# Patient Record
Sex: Male | Born: 1965
Health system: Southern US, Community
[De-identification: ages and names within clinical notes are randomized; demographics above are authoritative.]

## PROBLEM LIST (undated history)

## (undated) DIAGNOSIS — R972 Elevated prostate specific antigen [PSA]: Secondary | ICD-10-CM

## (undated) DIAGNOSIS — M1990 Unspecified osteoarthritis, unspecified site: Secondary | ICD-10-CM

## (undated) DIAGNOSIS — Z87438 Personal history of other diseases of male genital organs: Secondary | ICD-10-CM

## (undated) DIAGNOSIS — J302 Other seasonal allergic rhinitis: Secondary | ICD-10-CM

## (undated) DIAGNOSIS — Z8042 Family history of malignant neoplasm of prostate: Secondary | ICD-10-CM

## (undated) DIAGNOSIS — E119 Type 2 diabetes mellitus without complications: Secondary | ICD-10-CM

## (undated) DIAGNOSIS — N529 Male erectile dysfunction, unspecified: Secondary | ICD-10-CM

## (undated) DIAGNOSIS — Z8679 Personal history of other diseases of the circulatory system: Secondary | ICD-10-CM

## (undated) DIAGNOSIS — G709 Myoneural disorder, unspecified: Secondary | ICD-10-CM

## (undated) DIAGNOSIS — E785 Hyperlipidemia, unspecified: Secondary | ICD-10-CM

## (undated) DIAGNOSIS — A6000 Herpesviral infection of urogenital system, unspecified: Secondary | ICD-10-CM

## (undated) HISTORY — DX: Morbid (severe) obesity due to excess calories: E66.01

## (undated) HISTORY — DX: Type 2 diabetes mellitus without complications: E11.9

## (undated) HISTORY — DX: Personal history of other diseases of male genital organs: Z87.438

## (undated) HISTORY — DX: Hyperlipidemia, unspecified: E78.5

## (undated) HISTORY — DX: Herpesviral infection of urogenital system, unspecified: A60.00

## (undated) HISTORY — DX: Myoneural disorder, unspecified: G70.9

## (undated) HISTORY — DX: Elevated prostate specific antigen (PSA): R97.20

## (undated) HISTORY — DX: Unspecified osteoarthritis, unspecified site: M19.90

## (undated) HISTORY — DX: Family history of malignant neoplasm of prostate: Z80.42

## (undated) HISTORY — DX: Male erectile dysfunction, unspecified: N52.9

## (undated) HISTORY — DX: Personal history of other diseases of the circulatory system: Z86.79

---

## 1998-09-21 ENCOUNTER — Emergency Department (HOSPITAL_COMMUNITY): Admission: EM | Admit: 1998-09-21 | Discharge: 1998-09-21 | Payer: Self-pay | Admitting: Internal Medicine

## 2006-06-26 ENCOUNTER — Encounter: Admission: RE | Admit: 2006-06-26 | Discharge: 2006-06-26 | Payer: Self-pay | Admitting: Family Medicine

## 2007-01-16 ENCOUNTER — Emergency Department: Payer: Self-pay | Admitting: Emergency Medicine

## 2008-07-17 DIAGNOSIS — E119 Type 2 diabetes mellitus without complications: Secondary | ICD-10-CM

## 2008-07-17 HISTORY — DX: Type 2 diabetes mellitus without complications: E11.9

## 2010-07-17 DIAGNOSIS — R972 Elevated prostate specific antigen [PSA]: Secondary | ICD-10-CM

## 2010-07-17 DIAGNOSIS — Z87438 Personal history of other diseases of male genital organs: Secondary | ICD-10-CM

## 2010-07-17 HISTORY — DX: Elevated prostate specific antigen (PSA): R97.20

## 2010-07-17 HISTORY — PX: OTHER SURGICAL HISTORY: SHX169

## 2010-07-17 HISTORY — PX: PROSTATE BIOPSY: SHX241

## 2010-07-17 HISTORY — DX: Personal history of other diseases of male genital organs: Z87.438

## 2011-04-04 ENCOUNTER — Other Ambulatory Visit (HOSPITAL_COMMUNITY): Payer: Self-pay | Admitting: Urology

## 2011-04-04 ENCOUNTER — Ambulatory Visit (HOSPITAL_COMMUNITY)
Admission: RE | Admit: 2011-04-04 | Discharge: 2011-04-04 | Disposition: A | Discharge status: Home / Self Care | Payer: BC Managed Care – PPO | Source: Ambulatory Visit | Attending: Urology | Admitting: Urology

## 2011-04-04 DIAGNOSIS — N418 Other inflammatory diseases of prostate: Secondary | ICD-10-CM

## 2011-04-04 DIAGNOSIS — Z79899 Other long term (current) drug therapy: Secondary | ICD-10-CM | POA: Insufficient documentation

## 2011-04-04 DIAGNOSIS — E119 Type 2 diabetes mellitus without complications: Secondary | ICD-10-CM | POA: Insufficient documentation

## 2011-04-04 DIAGNOSIS — I10 Essential (primary) hypertension: Secondary | ICD-10-CM | POA: Insufficient documentation

## 2011-09-25 LAB — LIPID PANEL
Cholesterol, Total: 203
Direct LDL: 96
HDL: 24 mg/dL — AB (ref 35–70)
Triglyceride fasting, serum: 349

## 2011-09-25 LAB — COMPREHENSIVE METABOLIC PANEL: Creat: 0.92

## 2012-02-09 ENCOUNTER — Ambulatory Visit (INDEPENDENT_AMBULATORY_CARE_PROVIDER_SITE_OTHER): Payer: BC Managed Care – PPO | Admitting: Family Medicine

## 2012-02-09 ENCOUNTER — Telehealth: Payer: Self-pay | Admitting: *Deleted

## 2012-02-09 ENCOUNTER — Encounter: Payer: Self-pay | Admitting: Family Medicine

## 2012-02-09 VITALS — BP 130/80 | HR 85 | Temp 97.7°F | Ht 73.0 in | Wt 305.0 lb

## 2012-02-09 DIAGNOSIS — I10 Essential (primary) hypertension: Secondary | ICD-10-CM | POA: Insufficient documentation

## 2012-02-09 DIAGNOSIS — E1140 Type 2 diabetes mellitus with diabetic neuropathy, unspecified: Secondary | ICD-10-CM | POA: Insufficient documentation

## 2012-02-09 DIAGNOSIS — E669 Obesity, unspecified: Secondary | ICD-10-CM

## 2012-02-09 DIAGNOSIS — IMO0002 Reserved for concepts with insufficient information to code with codable children: Secondary | ICD-10-CM | POA: Insufficient documentation

## 2012-02-09 DIAGNOSIS — E785 Hyperlipidemia, unspecified: Secondary | ICD-10-CM

## 2012-02-09 DIAGNOSIS — E119 Type 2 diabetes mellitus without complications: Secondary | ICD-10-CM

## 2012-02-09 DIAGNOSIS — N529 Male erectile dysfunction, unspecified: Secondary | ICD-10-CM

## 2012-02-09 DIAGNOSIS — E118 Type 2 diabetes mellitus with unspecified complications: Secondary | ICD-10-CM | POA: Insufficient documentation

## 2012-02-09 DIAGNOSIS — E1169 Type 2 diabetes mellitus with other specified complication: Secondary | ICD-10-CM | POA: Insufficient documentation

## 2012-02-09 DIAGNOSIS — Z8042 Family history of malignant neoplasm of prostate: Secondary | ICD-10-CM

## 2012-02-09 MED ORDER — ATORVASTATIN CALCIUM 10 MG PO TABS: 10.0000 mg | ORAL_TABLET | Freq: Every day | ORAL | Status: DC

## 2012-02-09 MED ORDER — LISINOPRIL 10 MG PO TABS: 10.0000 mg | ORAL_TABLET | Freq: Every day | ORAL | Status: DC

## 2012-02-09 MED ORDER — METFORMIN HCL 1000 MG PO TABS: 1000.0000 mg | ORAL_TABLET | Freq: Two times a day (BID) | ORAL | Status: DC

## 2012-02-09 MED ORDER — GLIMEPIRIDE 4 MG PO TABS: 4.0000 mg | ORAL_TABLET | Freq: Every day | ORAL | Status: DC

## 2012-02-09 MED ORDER — TADALAFIL 10 MG PO TABS: 10.0000 mg | ORAL_TABLET | Freq: Every day | ORAL | Status: DC | PRN

## 2012-02-09 NOTE — Patient Instructions (Addendum)
Restart your medicines I've decreased lisinopril to 10mg  daily Restart metformin at 1000mg  nightly for 1 week, then may start twice daily. Return to see me in 2-3 months for follow up prior fasting for blood work. Return in February for physical.

## 2012-02-09 NOTE — Assessment & Plan Note (Signed)
Per pt, recent normal stress test. Will do trial of cialis, have requested records from prior PCP for w/u discussed side effects to watch for.

## 2012-02-09 NOTE — Assessment & Plan Note (Signed)
Discussed healthy diet/lifestyle changes to affect weight loss. 

## 2012-02-09 NOTE — Telephone Encounter (Signed)
PA form for cialis in your IN box

## 2012-02-09 NOTE — Assessment & Plan Note (Signed)
Mild.  Start ACEI at lower dose.

## 2012-02-09 NOTE — Progress Notes (Signed)
Subjective:    Patient ID: Jonathon [name], male    DOB: 10-Feb-1966, 46 y.o.   MRN: [id number]  HPI CC: new pt establish  Prior saw Dr. Reece Agar at [facility].  Having some finger issues bilateral hands - started noticing more for last 2 weeks.  Pin/needle sensation worse when extending arms, gets shooting sensation and burning pain in 1st 2 fingers with pincer grasp.  No sxs with bent arms.  DM - dx 2010.  Out of meds for last 3 weeks.  Sugars running high - 190-220 fasting.  Last A1c was 8.  No low sugars.  + paresthesias - see above. Body mass index is 40.24 kg/(m^2).   HLD - takes lipitor at bedtime, was not having muscle pains when taking   HTN - overall well controlled.  Was taking lisinopril 20mg  daily.  ED - Was discussing PRN cialis with prior PCP.  Has not been on other medicines for this.  Does not have firm erection.  Sounds like had stress test done - normal.  + am erections.  fam hx prostate cancer- brother age 35.  Last prostate exam was 2012, told normal.  Will need rpt at CPE.  Lives with wife.  Has 5 children but none live at home. Occupation: Late Magazine features editor at [employer]: HS Activity: walks 2 mi 1x/wk Diet: good water (5-6 glasses), 8oz grape juice at night  Medications and allergies reviewed and updated in chart.  Past histories reviewed and updated if relevant as below. There is no problem list on file for this patient.  Past Medical History  Diagnosis Date  . T2DM (type 2 diabetes mellitus) 2010  . HTN (hypertension)   . HLD (hyperlipidemia)   . Obesity   . ED (erectile dysfunction)   . Family history of prostate cancer     father, brother   No past surgical history on file. History  Substance Use Topics  . Smoking status: Never Smoker   . Smokeless tobacco: Never Used  . Alcohol Use: Yes     occasional   Family History  Problem Relation Age of Onset  . Cancer Father 45    colon, prostate  . Hypertension Father   . Cancer Brother 44   prostate  . Diabetes Mother   . Diabetes Father   . Diabetes Sister   . Stroke Father 75  . Coronary artery disease Father 38    MI   No Known Allergies Current Outpatient Prescriptions on File Prior to Visit  Medication Sig Dispense Refill  . atorvastatin (LIPITOR) 10 MG tablet Take 1 tablet (10 mg total) by mouth at bedtime.  30 tablet  11  . glimepiride (AMARYL) 4 MG tablet Take 1 tablet (4 mg total) by mouth daily before breakfast.  30 tablet  11  . lisinopril (PRINIVIL,ZESTRIL) 10 MG tablet Take 1 tablet (10 mg total) by mouth daily.  30 tablet  11  . metFORMIN (GLUCOPHAGE) 1000 MG tablet Take 1 tablet (1,000 mg total) by mouth 2 (two) times daily with a meal.  60 tablet  11  . tadalafil (CIALIS) 10 MG tablet Take 1 tablet (10 mg total) by mouth daily as needed for erectile dysfunction.  10 tablet  0     Review of Systems  Constitutional: Negative for fever, chills, activity change, appetite change, fatigue and unexpected weight change.  HENT: Negative for hearing loss and neck pain.   Eyes: Negative for visual disturbance.  Respiratory: Negative for cough, chest tightness, shortness  of breath and wheezing.   Cardiovascular: Negative for chest pain, palpitations and leg swelling.  Gastrointestinal: Negative for nausea, vomiting, abdominal pain, diarrhea, constipation, blood in stool and abdominal distention.  Genitourinary: Negative for hematuria and difficulty urinating.  Musculoskeletal: Negative for myalgias and arthralgias.  Skin: Negative for rash.  Neurological: Negative for dizziness, seizures, syncope and headaches.  Hematological: Does not bruise/bleed easily.  Psychiatric/Behavioral: Negative for dysphoric mood. The patient is not nervous/anxious.        Objective:   Physical Exam  Nursing note and vitals reviewed. Constitutional: He is oriented to person, place, and time. He appears well-developed and well-nourished. No distress.  HENT:  Head: Normocephalic and  atraumatic.  Right Ear: Hearing, tympanic membrane, external ear and ear canal normal.  Left Ear: Hearing, tympanic membrane, external ear and ear canal normal.  Nose: Nose normal.  Mouth/Throat: Oropharynx is clear and moist. No oropharyngeal exudate.  Eyes: Conjunctivae and EOM are normal. Pupils are equal, round, and reactive to light. No scleral icterus.  Neck: Normal range of motion. Neck supple. Carotid bruit is not present.  Cardiovascular: Normal rate, regular rhythm, normal heart sounds and intact distal pulses.   No murmur heard. Pulses:      Radial pulses are 2+ on the right side, and 2+ on the left side.  Pulmonary/Chest: Effort normal and breath sounds normal. No respiratory distress. He has no wheezes. He has no rales.  Abdominal: Soft. Bowel sounds are normal. He exhibits no distension and no mass. There is no tenderness. There is no rebound and no guarding.  Musculoskeletal: Normal range of motion. He exhibits no edema.       Diabetic foot exam: Normal inspection No skin breakdown Some callus formation right soles Normal DP/PT pulses Normal sensation to light touch and monofilament Nails normal  Lymphadenopathy:    He has no cervical adenopathy.  Neurological: He is alert and oriented to person, place, and time.       CN grossly intact, station and gait intact  Skin: Skin is warm and dry. No rash noted.  Psychiatric: He has a normal mood and affect. His behavior is normal. Judgment and thought content normal.      Assessment & Plan:

## 2012-02-09 NOTE — Assessment & Plan Note (Signed)
Check FLP at f/u Restart lipitor.

## 2012-02-09 NOTE — Assessment & Plan Note (Signed)
Chronic. Poor control as off meds for 3 weeks and last A1c was 8%.  discussed reasons for diabetic control including preventing periph neuropathy, cardiovascular risk, and eye damage, kidney damage, poor wound healing. Restart today, slowly start metformin - qhs for 1 wk then up to bid. rtc 3 mo for f/u. Requested records today.

## 2012-02-09 NOTE — Assessment & Plan Note (Signed)
Will need rpt PSA/DRE at f/u

## 2012-02-12 NOTE — Telephone Encounter (Signed)
Filled and placed in Kim's box. 

## 2012-02-12 NOTE — Telephone Encounter (Signed)
Form faxed on 02/12/12 to [phone]

## 2012-02-15 ENCOUNTER — Other Ambulatory Visit: Payer: Self-pay

## 2012-02-15 NOTE — Telephone Encounter (Signed)
Pt said he checked at [facility] and they did not receive Cialis. Spoke with Angelique Blonder at [facility] and they did receive Cialis rx but insurance would not let go thru on 02/09/12. Angelique Blonder tried and rx did process. Pt notified while on phone can pick up at [facility] [place].

## 2012-02-20 NOTE — Telephone Encounter (Signed)
Prior auth given for Cialis, letter placed Dr Sharen Hones in box for signature and scanning.

## 2012-03-18 ENCOUNTER — Other Ambulatory Visit: Payer: Self-pay | Admitting: Family Medicine

## 2012-04-07 ENCOUNTER — Encounter: Payer: Self-pay | Admitting: Family Medicine

## 2012-04-12 ENCOUNTER — Encounter: Payer: Self-pay | Admitting: Family Medicine

## 2012-05-13 ENCOUNTER — Ambulatory Visit: Payer: BC Managed Care – PPO | Admitting: Family Medicine

## 2012-08-20 ENCOUNTER — Ambulatory Visit (INDEPENDENT_AMBULATORY_CARE_PROVIDER_SITE_OTHER): Payer: BC Managed Care – PPO | Admitting: Family Medicine

## 2012-08-20 ENCOUNTER — Encounter: Payer: Self-pay | Admitting: Family Medicine

## 2012-08-20 VITALS — BP 136/82 | HR 76 | Temp 98.2°F | Wt 307.5 lb

## 2012-08-20 DIAGNOSIS — L03311 Cellulitis of abdominal wall: Secondary | ICD-10-CM | POA: Insufficient documentation

## 2012-08-20 DIAGNOSIS — L539 Erythematous condition, unspecified: Secondary | ICD-10-CM

## 2012-08-20 DIAGNOSIS — E119 Type 2 diabetes mellitus without complications: Secondary | ICD-10-CM

## 2012-08-20 DIAGNOSIS — N489 Disorder of penis, unspecified: Secondary | ICD-10-CM

## 2012-08-20 DIAGNOSIS — R21 Rash and other nonspecific skin eruption: Secondary | ICD-10-CM | POA: Insufficient documentation

## 2012-08-20 MED ORDER — VALACYCLOVIR HCL 500 MG PO TABS: 500.0000 mg | ORAL_TABLET | Freq: Two times a day (BID) | ORAL | Status: DC

## 2012-08-20 MED ORDER — HYDROXYZINE HCL 25 MG PO TABS: 25.0000 mg | ORAL_TABLET | Freq: Two times a day (BID) | ORAL | Status: DC | PRN

## 2012-08-20 MED ORDER — SULFAMETHOXAZOLE-TRIMETHOPRIM 800-160 MG PO TABS: 1.0000 | ORAL_TABLET | Freq: Two times a day (BID) | ORAL | Status: DC

## 2012-08-20 NOTE — Patient Instructions (Signed)
I think abdominal rash is due to skin infection or cellulitis - treat with bactrim twice daily for 7 days. I think groin rash is due to herpes - treat with valtrex. Hydroxyzine or atarax for itch Return on Friday for recheck cellulitis.

## 2012-08-20 NOTE — Assessment & Plan Note (Addendum)
given tenderness and calor - concern for abdominal cellulitis. Treat as such with bactrim DS 1 po bid x 7 days. I've asked Twain to return on Friday for recheck abdominal cellulitis.

## 2012-08-20 NOTE — Progress Notes (Signed)
  Subjective:    Patient ID: Jonathon [name], male    DOB: 1966/02/28, 47 y.o.   MRN: [id number]  HPI CC: rash, groin pain  Wife passed away 2 wks ago from heart attack (49yo).  Local supportive family in [place].  DM - fasting sugars 140-150 in am, states good #s for him.  Prior paresthesias resolved.  Vision exam - has appt 10/11/2012.  No low sugars.  Compliant with meds and watching diet.  ED - interested in daily cialis.  PRN cialis working well for him.  Rash - 4d h/o rash under abdomen with erythema and soreness.  Not itchy.  New spots on right leg.  Has tried rubbing alcohol.  Penis rash/discomfort - for last 1.5 mo.  Initially thought was yeast infection - treated with vagisil which helped - seems to be recurring esp after intercourse or orgasm.  Small itchy bumps.  Denies new sexual partners.  Denies h/o STDs.  Denies fevers/chills, urethral discharge, dysuria.  Known h/o genital herpes.  No recent herpes outbreak.  Wt Readings from Last 3 Encounters:  08/20/12 307 lb 8 oz (139.481 kg)  02/09/12 305 lb (138.347 kg)    No results found for this basename: HGBA1C   Past Medical History  Diagnosis Date  . T2DM (type 2 diabetes mellitus) 2010    prior on levemir  . HTN (hypertension)   . HLD (hyperlipidemia)   . Obesity   . ED (erectile dysfunction) of organic origin   . Family history of prostate cancer     father, brother  . History of chronic prostatitis 2012    granulomatous, CXR done - negative  . Elevated PSA     with L lobe induration on DRE, normal biopsy 2012 (Ottelin)  . Genital herpes     Review of Systems Per HPI    Objective:   Physical Exam  Nursing note and vitals reviewed. Constitutional: He appears well-developed and well-nourished. No distress.  Cardiovascular: Normal rate, regular rhythm, normal heart sounds and intact distal pulses.   No murmur heard. Pulmonary/Chest: Effort normal and breath sounds normal. No respiratory distress. He has no wheezes. He  has no rales.  Abdominal: Soft. Bowel sounds are normal. He exhibits no distension and no mass. There is no tenderness. There is no rebound and no guarding.  Genitourinary:       Head of penis with erythematous papules, pruritic. Hypopigmented head and foreskin Isolated papule/nodule on left shaft  Skin: There is erythema.          Left lower abdomen with tender erythematous patch, ++calor. Right lower abdomen with tender warm indurated smaller erythematous area.  No fluctuance. No rash around groin or inner thighs.       Assessment & Plan:

## 2012-08-21 ENCOUNTER — Encounter: Payer: Self-pay | Admitting: Family Medicine

## 2012-08-21 NOTE — Assessment & Plan Note (Signed)
Chronic. Not seen since 01/2012.   Discussed need for A1c and other blood work. Requests testing on Friday when returns for recheck cellulitis.

## 2012-08-21 NOTE — Assessment & Plan Note (Addendum)
Anticipate recurrent herpes infection, likely due to recent stress of wife passing. Treat with valtrex. Hydroxyzine for itch. Denies STD risk. I did recommend testing for STI's including HIV, RPR, GC/CT, and herpes - pt declines this today.

## 2012-08-23 ENCOUNTER — Ambulatory Visit (INDEPENDENT_AMBULATORY_CARE_PROVIDER_SITE_OTHER): Payer: BC Managed Care – PPO | Admitting: Family Medicine

## 2012-08-23 ENCOUNTER — Encounter: Payer: Self-pay | Admitting: Family Medicine

## 2012-08-23 VITALS — BP 130/86 | HR 84 | Temp 98.3°F | Wt 307.0 lb

## 2012-08-23 DIAGNOSIS — L539 Erythematous condition, unspecified: Secondary | ICD-10-CM

## 2012-08-23 DIAGNOSIS — R21 Rash and other nonspecific skin eruption: Secondary | ICD-10-CM

## 2012-08-23 DIAGNOSIS — E119 Type 2 diabetes mellitus without complications: Secondary | ICD-10-CM

## 2012-08-23 DIAGNOSIS — N489 Disorder of penis, unspecified: Secondary | ICD-10-CM

## 2012-08-23 DIAGNOSIS — E669 Obesity, unspecified: Secondary | ICD-10-CM

## 2012-08-23 LAB — BASIC METABOLIC PANEL
BUN: 9 mg/dL (ref 6–23)
CO2: 28 mEq/L (ref 19–32)
Calcium: 9.3 mg/dL (ref 8.4–10.5)
Chloride: 104 mEq/L (ref 96–112)
Creatinine, Ser: 1 mg/dL (ref 0.4–1.5)
Glucose, Bld: 117 mg/dL — ABNORMAL HIGH (ref 70–99)

## 2012-08-23 LAB — HEMOGLOBIN A1C: Hgb A1c MFr Bld: 9.4 % — ABNORMAL HIGH (ref 4.6–6.5)

## 2012-08-23 MED ORDER — SULFAMETHOXAZOLE-TRIMETHOPRIM 800-160 MG PO TABS: 1.0000 | ORAL_TABLET | Freq: Two times a day (BID) | ORAL | Status: DC

## 2012-08-23 NOTE — Patient Instructions (Signed)
Cellulitis is looking better. Start daily warm compresses - 3 times daily if able. Another course of bactrim sent in for total 2 week course of antibiotic for cellulitis. Blood work today to check diabetes. Return in 3 months for follow up sugars.

## 2012-08-23 NOTE — Assessment & Plan Note (Signed)
Per pt - improving on valtrex.  Anticipate herpetic outbreak.

## 2012-08-23 NOTE — Assessment & Plan Note (Signed)
Marked improvement with bactrim. Will extend course for total of 14 days. Concern for developing hydradentitis given marked induration today. Encouraged warm compresses.

## 2012-08-23 NOTE — Assessment & Plan Note (Signed)
Discussed reasons to treat DM in setting of cellulitis. Blood work today. rtc in 3 mo for f/u.

## 2012-08-23 NOTE — Assessment & Plan Note (Signed)
Encouraged weight loss to help control sugars. 

## 2012-08-23 NOTE — Progress Notes (Signed)
  Subjective:    Patient ID: Jonathon [name], male    DOB: Oct 26, 1965, 47 y.o.   MRN: [id number]  HPI CC: f/u cellulitis  See prior note for details.  Tolerating bactrim ds bid and valtrex well. Both working well.  Smaller redness and tenderness.    Due for diabetes check -  DM - fasting sugars 140-150 in am, states good #s for him. Prior paresthesias resolved. Vision exam - has appt 10/11/2012. No low sugars. Compliant with meds and watching diet.    Past Medical History  Diagnosis Date  . T2DM (type 2 diabetes mellitus) 2010    prior on levemir  . HTN (hypertension)   . HLD (hyperlipidemia)   . Obesity   . ED (erectile dysfunction) of organic origin   . Family history of prostate cancer     father, brother  . History of chronic prostatitis 2012    granulomatous, CXR done - negative  . Elevated PSA     with L lobe induration on DRE, normal biopsy 2012 (Ottelin)  . Genital herpes       Review of Systems Per HPI    Objective:   Physical Exam  Nursing note and vitals reviewed. Constitutional: He appears well-developed and well-nourished. No distress.  Musculoskeletal:       Diabetic foot exam: Normal inspection No skin breakdown Early callus formation R sole Normal DP/PT pulses Normal sensation to light touch and monofilament Nails normal   Skin: There is erythema.       Less angry erythematous patches bilateral abdomen. Slight erythema and calor - overall improvement Marked induration at prior site of cellulitis       Assessment & Plan:

## 2012-08-25 ENCOUNTER — Other Ambulatory Visit: Payer: Self-pay | Admitting: Family Medicine

## 2012-08-25 MED ORDER — GLIMEPIRIDE 4 MG PO TABS: 8.0000 mg | ORAL_TABLET | Freq: Every day | ORAL | Status: DC

## 2012-09-04 ENCOUNTER — Other Ambulatory Visit: Payer: Self-pay | Admitting: Family Medicine

## 2012-10-12 ENCOUNTER — Other Ambulatory Visit: Payer: Self-pay | Admitting: Family Medicine

## 2012-11-16 ENCOUNTER — Other Ambulatory Visit: Payer: Self-pay | Admitting: Family Medicine

## 2012-11-20 ENCOUNTER — Ambulatory Visit (INDEPENDENT_AMBULATORY_CARE_PROVIDER_SITE_OTHER): Payer: BC Managed Care – PPO | Admitting: Family Medicine

## 2012-11-20 ENCOUNTER — Encounter: Payer: Self-pay | Admitting: Family Medicine

## 2012-11-20 VITALS — BP 126/92 | HR 80 | Temp 98.2°F | Wt 304.2 lb

## 2012-11-20 DIAGNOSIS — Z8042 Family history of malignant neoplasm of prostate: Secondary | ICD-10-CM

## 2012-11-20 DIAGNOSIS — I10 Essential (primary) hypertension: Secondary | ICD-10-CM

## 2012-11-20 DIAGNOSIS — N529 Male erectile dysfunction, unspecified: Secondary | ICD-10-CM

## 2012-11-20 DIAGNOSIS — E785 Hyperlipidemia, unspecified: Secondary | ICD-10-CM

## 2012-11-20 DIAGNOSIS — E119 Type 2 diabetes mellitus without complications: Secondary | ICD-10-CM

## 2012-11-20 LAB — LIPID PANEL
LDL Cholesterol: 90 mg/dL (ref 0–99)
Total CHOL/HDL Ratio: 5

## 2012-11-20 LAB — BASIC METABOLIC PANEL
CO2: 25 mEq/L (ref 19–32)
Calcium: 9.2 mg/dL (ref 8.4–10.5)
Chloride: 105 mEq/L (ref 96–112)
Sodium: 138 mEq/L (ref 135–145)

## 2012-11-20 LAB — HEMOGLOBIN A1C: Hgb A1c MFr Bld: 8.4 % — ABNORMAL HIGH (ref 4.6–6.5)

## 2012-11-20 MED ORDER — TADALAFIL 2.5 MG PO TABS: 2.5000 mg | ORAL_TABLET | Freq: Every day | ORAL | Status: DC

## 2012-11-20 NOTE — Patient Instructions (Addendum)
Sugars sound like they're doing great! Let's check some days fasting in morning, and some days 2 hours after a meal. Continue meds as up to now. Return in 3-4 months for physical. Good to see you today, call us with questions.

## 2012-11-20 NOTE — Assessment & Plan Note (Signed)
Chronic, recheck FLP today. On lipitor compliant

## 2012-11-20 NOTE — Progress Notes (Signed)
  Subjective:    Patient ID: Jonathon [name], male    DOB: October 29, 1965, 47 y.o.   MRN: [id number]  HPI CC: f/u DM  Wife suddenly passed away 2012/08/18.  Abdominal cellulitis has resolved.  DM - fasting sugars in am 90-100s.  Checks every morning.  Denies hypoglycemic sxs.  Lowest cbg was 88 - felt sleepy on this.  No paresthesias.  Compliant with metformin 1000mg  bid and amaryl 71m bid.  Eye doctor - cancelled retina specialist appt when wife passed away.  Due to reschedule.   Lab Results  Component Value Date   HGBA1C 9.4* 08/23/2012   HLD - due for recheck.  Compliant with atorvastatin.  H/o elevated PSA - states released by urologist.   Denies chest pain, tightness, dyspnea, paresthesias.  due for physical.  Wt Readings from Last 3 Encounters:  11/20/12 304 lb 4 oz (138.007 kg)  08/23/12 307 lb (139.254 kg)  08/20/12 307 lb 8 oz (139.481 kg)    Past Medical History  Diagnosis Date  . T2DM (type 2 diabetes mellitus) 2010    prior on levemir  . HTN (hypertension)   . HLD (hyperlipidemia)   . Obesity   . ED (erectile dysfunction) of organic origin   . Family history of prostate cancer     father, brother  . History of chronic prostatitis 2012    granulomatous, CXR done - negative  . Elevated PSA     with L lobe induration on DRE, normal biopsy 2012 (Ottelin)  . Genital herpes      Review of Systems Per HPi    Objective:   Physical Exam  Nursing note and vitals reviewed. Constitutional: He appears well-developed and well-nourished. No distress.  HENT:  Head: Normocephalic and atraumatic.  Mouth/Throat: Oropharynx is clear and moist. No oropharyngeal exudate.  Eyes: Conjunctivae and EOM are normal. Pupils are equal, round, and reactive to light. No scleral icterus.  Cardiovascular: Normal rate, regular rhythm, normal heart sounds and intact distal pulses.   No murmur heard. Pulmonary/Chest: Effort normal and breath sounds normal. No respiratory distress. He has no wheezes.  He has no rales.  Musculoskeletal: He exhibits no edema.  Skin: Skin is warm and dry. No rash noted.       Assessment & Plan:

## 2012-11-20 NOTE — Assessment & Plan Note (Addendum)
Normal ETT 2012 (Dr. Garnette Scheuermann) Will do daily cialis per pt request.  States prn dose not as effective as he would like.

## 2012-11-20 NOTE — Assessment & Plan Note (Signed)
Chronic, stable. Continue med. 

## 2012-11-20 NOTE — Assessment & Plan Note (Addendum)
Strong fmhx prostate cancer.  Pt states has been released by urology ? Will check PSA when returns for physical.

## 2012-11-20 NOTE — Assessment & Plan Note (Signed)
Significantly improved control per pt's recalled CBGs. Check A1c today. If good control, no changes. Advised checking sugars post prandial occasionally as well as fasting intermittently. Discussed hypoglycemic plan.

## 2012-11-21 ENCOUNTER — Other Ambulatory Visit: Payer: Self-pay | Admitting: Family Medicine

## 2012-11-21 MED ORDER — SITAGLIPTIN PHOSPHATE 50 MG PO TABS: 50.0000 mg | ORAL_TABLET | Freq: Every day | ORAL | Status: DC

## 2012-11-28 ENCOUNTER — Encounter: Payer: Self-pay | Admitting: Family Medicine

## 2012-11-28 ENCOUNTER — Ambulatory Visit (INDEPENDENT_AMBULATORY_CARE_PROVIDER_SITE_OTHER): Payer: BC Managed Care – PPO | Admitting: Family Medicine

## 2012-11-28 VITALS — BP 128/84 | HR 64 | Temp 98.3°F | Wt 309.0 lb

## 2012-11-28 DIAGNOSIS — L03113 Cellulitis of right upper limb: Secondary | ICD-10-CM | POA: Insufficient documentation

## 2012-11-28 DIAGNOSIS — Z23 Encounter for immunization: Secondary | ICD-10-CM

## 2012-11-28 DIAGNOSIS — L02519 Cutaneous abscess of unspecified hand: Secondary | ICD-10-CM

## 2012-11-28 LAB — CBC WITH DIFFERENTIAL/PLATELET
Basophils Relative: 0.4 % (ref 0.0–3.0)
Eosinophils Relative: 1.5 % (ref 0.0–5.0)
HCT: 42.5 % (ref 39.0–52.0)
MCV: 91.5 fl (ref 78.0–100.0)
Monocytes Absolute: 0.6 10*3/uL (ref 0.1–1.0)
Monocytes Relative: 8.5 % (ref 3.0–12.0)
Neutrophils Relative %: 46.6 % (ref 43.0–77.0)
Platelets: 250 10*3/uL (ref 150.0–400.0)
RBC: 4.64 Mil/uL (ref 4.22–5.81)
WBC: 7.1 10*3/uL (ref 4.5–10.5)

## 2012-11-28 LAB — HIGH SENSITIVITY CRP: CRP, High Sensitivity: 6.87 mg/L — ABNORMAL HIGH (ref 0.000–5.000)

## 2012-11-28 MED ORDER — SULFAMETHOXAZOLE-TRIMETHOPRIM 800-160 MG PO TABS: 2.0000 | ORAL_TABLET | Freq: Two times a day (BID) | ORAL | Status: DC

## 2012-11-28 MED ORDER — CEPHALEXIN 500 MG PO CAPS: 500.0000 mg | ORAL_CAPSULE | Freq: Three times a day (TID) | ORAL | Status: DC

## 2012-11-28 NOTE — Addendum Note (Signed)
Addended by: Josph Macho A on: 11/28/2012 11:52 AM   Modules accepted: Orders

## 2012-11-28 NOTE — Assessment & Plan Note (Addendum)
Of 3 d duration. Area delinated.  About 3.5cm erythema. Nothing to culture. Given location, will refer to hand surgery today for further eval and assistance with management. Start bactrim and keflex today. Tdap today.

## 2012-11-28 NOTE — Progress Notes (Addendum)
Subjective:    Patient ID: Jonathon [name], male    DOB: 07/19/1965, 47 y.o.   MRN: [id number]  HPI CC: R hand swelling  3d ago started noticing swelling, warmth, redness and pain of right palm.  Hand remaining sore.  R 4th/5th fingers tender to extension.  Has ben taking ibuprofen which helps  Denies any injury or bug bites to start this. No systemic sxs such as fever/nausea.  Recent treatment for abdominal cellulitis with bactrim.  Unsure last tetanus shot - will update today.  R handed.  Lab Results  Component Value Date   HGBA1C 8.4* 11/20/2012    Medications and allergies reviewed and updated in chart.  Past histories reviewed and updated if relevant as below. Patient Active Problem List   Diagnosis Date Noted  . Penile rash 08/20/2012  . T2DM (type 2 diabetes mellitus)   . HTN (hypertension)   . HLD (hyperlipidemia)   . Obesity   . ED (erectile dysfunction) of organic origin   . Family history of prostate cancer    Past Medical History  Diagnosis Date  . T2DM (type 2 diabetes mellitus) 2010    prior on levemir  . HTN (hypertension)   . HLD (hyperlipidemia)   . Obesity   . ED (erectile dysfunction) of organic origin   . Family history of prostate cancer     father, brother  . History of chronic prostatitis 2012    granulomatous, CXR done - negative  . Elevated PSA     with L lobe induration on DRE, normal biopsy 2012 (Ottelin)  . Genital herpes    Past Surgical History  Procedure Laterality Date  . Prostate biopsy  2012    granulomatous prostatitis  . Ett  2012    WNL (Hank Katrinka Blazing)   History  Substance Use Topics  . Smoking status: Never Smoker   . Smokeless tobacco: Never Used  . Alcohol Use: Yes     Comment: occasional   Family History  Problem Relation Age of Onset  . Cancer Father 49    colon, prostate  . Hypertension Father   . Cancer Brother 14    prostate  . Diabetes Mother   . Diabetes Father   . Diabetes Sister   . Stroke Father 55  .  Coronary artery disease Father 22    MI   No Known Allergies Current Outpatient Prescriptions on File Prior to Visit  Medication Sig Dispense Refill  . atorvastatin (LIPITOR) 10 MG tablet Take 1 tablet (10 mg total) by mouth at bedtime.  30 tablet  11  . glimepiride (AMARYL) 4 MG tablet Take 4 mg by mouth 2 (two) times daily.      Marland Kitchen lisinopril (PRINIVIL,ZESTRIL) 10 MG tablet Take 1 tablet (10 mg total) by mouth daily.  30 tablet  11  . metFORMIN (GLUCOPHAGE) 1000 MG tablet Take 1 tablet (1,000 mg total) by mouth 2 (two) times daily with a meal.  60 tablet  11  . Tadalafil (CIALIS) 2.5 MG TABS Take 1 tablet (2.5 mg total) by mouth daily.  30 tablet  1  . valACYclovir (VALTREX) 500 MG tablet TAKE 1 TABLET BY MOUTH TWICE A DAY AS DIRECTED  30 tablet  3  . sitaGLIPtin (JANUVIA) 50 MG tablet Take 1 tablet (50 mg total) by mouth daily.  30 tablet  11   No current facility-administered medications on file prior to visit.     Review of Systems Per HPI    Objective:  Physical Exam 2+ rad pulses Sensation intact R medial mid palm erythematous patch of skin about 3.5cm diameter.  Very tender to palpation.  At center of erythema is red spot.  No fluctuance, possible pointing. Pain with extension of R 4th/5th MCP. Area delinated    Assessment & Plan:  400mg  ibuprofen given at 12:20pm

## 2012-11-28 NOTE — Addendum Note (Signed)
Addended by: [name] on: 11/28/2012 12:21 PM   Modules accepted: Orders

## 2012-11-28 NOTE — Patient Instructions (Addendum)
Tetanus shot today (Tdap) Start bactrim 2 tablets twice daily and keflex one tablet three times daily for next 10 days. Pass by Marion's office for referral to hand doctor. You have cellulitis of hand, I'm worried about developing abscess.  If abscess, may need sugery.  If not responding to oral antibiotics, you may need IV antibiotics for hand infection.

## 2012-12-02 ENCOUNTER — Other Ambulatory Visit: Payer: Self-pay | Admitting: Orthopedic Surgery

## 2012-12-02 ENCOUNTER — Other Ambulatory Visit: Payer: Self-pay

## 2012-12-02 ENCOUNTER — Encounter (HOSPITAL_BASED_OUTPATIENT_CLINIC_OR_DEPARTMENT_OTHER): Payer: Self-pay | Admitting: *Deleted

## 2012-12-02 ENCOUNTER — Encounter (HOSPITAL_BASED_OUTPATIENT_CLINIC_OR_DEPARTMENT_OTHER)
Admission: RE | Admit: 2012-12-02 | Discharge: 2012-12-02 | Disposition: A | Discharge status: Home / Self Care | Payer: BC Managed Care – PPO | Source: Ambulatory Visit | Attending: Orthopedic Surgery | Admitting: Orthopedic Surgery

## 2012-12-02 LAB — BASIC METABOLIC PANEL
BUN: 13 mg/dL (ref 6–23)
Calcium: 9.9 mg/dL (ref 8.4–10.5)
Creatinine, Ser: 0.99 mg/dL (ref 0.50–1.35)
GFR calc Af Amer: >90 mL/min (ref >90)
GFR calc non Af Amer: >90 mL/min (ref >90)
Potassium: 4.4 mEq/L (ref 3.5–5.1)

## 2012-12-02 NOTE — Progress Notes (Signed)
Here -ekg and bmet done-

## 2012-12-03 ENCOUNTER — Ambulatory Visit (HOSPITAL_BASED_OUTPATIENT_CLINIC_OR_DEPARTMENT_OTHER): Payer: BC Managed Care – PPO | Admitting: Anesthesiology

## 2012-12-03 ENCOUNTER — Encounter (HOSPITAL_BASED_OUTPATIENT_CLINIC_OR_DEPARTMENT_OTHER): Payer: Self-pay | Admitting: Anesthesiology

## 2012-12-03 ENCOUNTER — Encounter (HOSPITAL_BASED_OUTPATIENT_CLINIC_OR_DEPARTMENT_OTHER): Payer: Self-pay | Admitting: *Deleted

## 2012-12-03 ENCOUNTER — Ambulatory Visit (HOSPITAL_BASED_OUTPATIENT_CLINIC_OR_DEPARTMENT_OTHER)
Admission: RE | Admit: 2012-12-03 | Discharge: 2012-12-03 | Disposition: A | Discharge status: Home / Self Care | Payer: BC Managed Care – PPO | Source: Ambulatory Visit | Attending: Orthopedic Surgery | Admitting: Orthopedic Surgery

## 2012-12-03 ENCOUNTER — Encounter (HOSPITAL_BASED_OUTPATIENT_CLINIC_OR_DEPARTMENT_OTHER)
Admission: RE | Disposition: A | Discharge status: Home / Self Care | Payer: Self-pay | Source: Ambulatory Visit | Attending: Orthopedic Surgery

## 2012-12-03 DIAGNOSIS — E785 Hyperlipidemia, unspecified: Secondary | ICD-10-CM | POA: Insufficient documentation

## 2012-12-03 DIAGNOSIS — G479 Sleep disorder, unspecified: Secondary | ICD-10-CM | POA: Insufficient documentation

## 2012-12-03 DIAGNOSIS — E119 Type 2 diabetes mellitus without complications: Secondary | ICD-10-CM | POA: Insufficient documentation

## 2012-12-03 DIAGNOSIS — R972 Elevated prostate specific antigen [PSA]: Secondary | ICD-10-CM | POA: Insufficient documentation

## 2012-12-03 DIAGNOSIS — Z79899 Other long term (current) drug therapy: Secondary | ICD-10-CM | POA: Insufficient documentation

## 2012-12-03 DIAGNOSIS — Z8042 Family history of malignant neoplasm of prostate: Secondary | ICD-10-CM | POA: Insufficient documentation

## 2012-12-03 DIAGNOSIS — I10 Essential (primary) hypertension: Secondary | ICD-10-CM | POA: Insufficient documentation

## 2012-12-03 DIAGNOSIS — L03119 Cellulitis of unspecified part of limb: Secondary | ICD-10-CM | POA: Insufficient documentation

## 2012-12-03 DIAGNOSIS — E669 Obesity, unspecified: Secondary | ICD-10-CM | POA: Insufficient documentation

## 2012-12-03 DIAGNOSIS — L02519 Cutaneous abscess of unspecified hand: Secondary | ICD-10-CM | POA: Insufficient documentation

## 2012-12-03 DIAGNOSIS — N529 Male erectile dysfunction, unspecified: Secondary | ICD-10-CM | POA: Insufficient documentation

## 2012-12-03 HISTORY — DX: Other seasonal allergic rhinitis: J30.2

## 2012-12-03 HISTORY — PX: I & D EXTREMITY: SHX5045

## 2012-12-03 LAB — GLUCOSE, CAPILLARY: Glucose-Capillary: 131 mg/dL — ABNORMAL HIGH (ref 70–99)

## 2012-12-03 SURGERY — IRRIGATION AND DEBRIDEMENT EXTREMITY
Anesthesia: General | Site: Hand | Laterality: Right | Wound class: Dirty or Infected

## 2012-12-03 MED ORDER — CHLORHEXIDINE GLUCONATE 4 % EX LIQD: 60.0000 mL | Freq: Once | CUTANEOUS | Status: DC

## 2012-12-03 MED ORDER — HYDROCODONE-ACETAMINOPHEN 5-325 MG PO TABS: ORAL_TABLET | ORAL | Status: DC

## 2012-12-03 MED ORDER — HYDROMORPHONE HCL PF 1 MG/ML IJ SOLN
0.2500 mg | INTRAMUSCULAR | Status: DC | PRN
  Administered 2012-12-03 (×4): 0.5 mg via INTRAVENOUS

## 2012-12-03 MED ORDER — MIDAZOLAM HCL 2 MG/2ML IJ SOLN
1.0000 mg | INTRAMUSCULAR | Status: DC | PRN
  Administered 2012-12-03: 2 mg via INTRAVENOUS

## 2012-12-03 MED ORDER — 0.9 % SODIUM CHLORIDE (POUR BTL) OPTIME
TOPICAL | Status: DC | PRN
  Administered 2012-12-03: 1000 mL

## 2012-12-03 MED ORDER — ONDANSETRON HCL 4 MG/2ML IJ SOLN: 4.0000 mg | Freq: Once | INTRAMUSCULAR | Status: DC | PRN

## 2012-12-03 MED ORDER — FENTANYL CITRATE 0.05 MG/ML IJ SOLN
50.0000 ug | INTRAMUSCULAR | Status: DC | PRN
  Administered 2012-12-03: 100 ug via INTRAVENOUS

## 2012-12-03 MED ORDER — OXYCODONE HCL 5 MG/5ML PO SOLN: 5.0000 mg | Freq: Once | ORAL | Status: AC | PRN

## 2012-12-03 MED ORDER — DEXTROSE 5 % IV SOLN
3.0000 g | INTRAVENOUS | Status: DC | PRN
  Administered 2012-12-03: 3 g via INTRAVENOUS

## 2012-12-03 MED ORDER — DOXYCYCLINE HYCLATE 50 MG PO CAPS: 100.0000 mg | ORAL_CAPSULE | Freq: Two times a day (BID) | ORAL | Status: DC

## 2012-12-03 MED ORDER — BUPIVACAINE HCL (PF) 0.25 % IJ SOLN
INTRAMUSCULAR | Status: DC | PRN
  Administered 2012-12-03: 9 mL

## 2012-12-03 MED ORDER — LACTATED RINGERS IV SOLN
INTRAVENOUS | Status: DC
  Administered 2012-12-03 (×2): via INTRAVENOUS

## 2012-12-03 MED ORDER — PROPOFOL 10 MG/ML IV BOLUS
INTRAVENOUS | Status: DC | PRN
  Administered 2012-12-03: 250 mg via INTRAVENOUS

## 2012-12-03 MED ORDER — HYDROMORPHONE HCL PF 1 MG/ML IJ SOLN
0.5000 mg | INTRAMUSCULAR | Status: DC | PRN
  Administered 2012-12-03 (×2): 0.5 mg via INTRAVENOUS

## 2012-12-03 MED ORDER — OXYCODONE HCL 5 MG PO TABS
5.0000 mg | ORAL_TABLET | Freq: Once | ORAL | Status: AC | PRN
  Administered 2012-12-03: 5 mg via ORAL

## 2012-12-03 MED ORDER — LIDOCAINE HCL (CARDIAC) 20 MG/ML IV SOLN
INTRAVENOUS | Status: DC | PRN
  Administered 2012-12-03: 100 mg via INTRAVENOUS

## 2012-12-03 SURGICAL SUPPLY — 56 items
BAG DECANTER FOR FLEXI CONT (MISCELLANEOUS) IMPLANT
BANDAGE ELASTIC 3 VELCRO ST LF (GAUZE/BANDAGES/DRESSINGS) ×1 IMPLANT
BANDAGE GAUZE ELAST BULKY 4 IN (GAUZE/BANDAGES/DRESSINGS) ×1 IMPLANT
BANDAGE GAUZE STRT 1 STR LF (GAUZE/BANDAGES/DRESSINGS) IMPLANT
BLADE MINI RND TIP GREEN BEAV (BLADE) IMPLANT
BLADE SURG 15 STRL LF DISP TIS (BLADE) ×2 IMPLANT
BLADE SURG 15 STRL SS (BLADE) ×4
BNDG CMPR 9X4 STRL LF SNTH (GAUZE/BANDAGES/DRESSINGS) ×1
BNDG CMPR MD 5X2 ELC HKLP STRL (GAUZE/BANDAGES/DRESSINGS)
BNDG COHESIVE 1X5 TAN STRL LF (GAUZE/BANDAGES/DRESSINGS) IMPLANT
BNDG ELASTIC 2 VLCR STRL LF (GAUZE/BANDAGES/DRESSINGS) IMPLANT
BNDG ESMARK 4X9 LF (GAUZE/BANDAGES/DRESSINGS) ×1 IMPLANT
CHLORAPREP W/TINT 26ML (MISCELLANEOUS) ×2 IMPLANT
CLOTH BEACON ORANGE TIMEOUT ST (SAFETY) ×2 IMPLANT
CORDS BIPOLAR (ELECTRODE) ×2 IMPLANT
COVER MAYO STAND STRL (DRAPES) ×2 IMPLANT
COVER TABLE BACK 60X90 (DRAPES) ×2 IMPLANT
CUFF TOURNIQUET SINGLE 18IN (TOURNIQUET CUFF) ×1 IMPLANT
DRAPE EXTREMITY T 121X128X90 (DRAPE) ×2 IMPLANT
DRAPE SURG 17X23 STRL (DRAPES) ×2 IMPLANT
DRSG PAD ABDOMINAL 8X10 ST (GAUZE/BANDAGES/DRESSINGS) ×1 IMPLANT
GAUZE PACKING IODOFORM 1/4X5 (PACKING) ×1 IMPLANT
GAUZE XEROFORM 1X8 LF (GAUZE/BANDAGES/DRESSINGS) ×1 IMPLANT
GLOVE BIO SURGEON STRL SZ 6 (GLOVE) ×1 IMPLANT
GLOVE BIO SURGEON STRL SZ 6.5 (GLOVE) ×1 IMPLANT
GLOVE BIO SURGEON STRL SZ7.5 (GLOVE) ×2 IMPLANT
GLOVE BIOGEL PI IND STRL 6.5 (GLOVE) IMPLANT
GLOVE BIOGEL PI IND STRL 7.0 (GLOVE) IMPLANT
GLOVE BIOGEL PI IND STRL 8 (GLOVE) ×1 IMPLANT
GLOVE BIOGEL PI INDICATOR 6.5 (GLOVE) ×1
GLOVE BIOGEL PI INDICATOR 7.0 (GLOVE) ×1
GLOVE BIOGEL PI INDICATOR 8 (GLOVE) ×1
GLOVE EXAM NITRILE LRG STRL (GLOVE) ×1 IMPLANT
GOWN BRE IMP PREV XXLGXLNG (GOWN DISPOSABLE) ×2 IMPLANT
GOWN PREVENTION PLUS XLARGE (GOWN DISPOSABLE) ×1 IMPLANT
LOOP VESSEL MAXI BLUE (MISCELLANEOUS) IMPLANT
NDL HYPO 25X1 1.5 SAFETY (NEEDLE) IMPLANT
NEEDLE HYPO 25X1 1.5 SAFETY (NEEDLE) ×2 IMPLANT
NS IRRIG 1000ML POUR BTL (IV SOLUTION) ×2 IMPLANT
PACK BASIN DAY SURGERY FS (CUSTOM PROCEDURE TRAY) ×2 IMPLANT
PAD CAST 3X4 CTTN HI CHSV (CAST SUPPLIES) IMPLANT
PADDING CAST ABS 4INX4YD NS (CAST SUPPLIES) ×1
PADDING CAST ABS COTTON 4X4 ST (CAST SUPPLIES) ×1 IMPLANT
PADDING CAST COTTON 3X4 STRL (CAST SUPPLIES)
SPLINT PLASTER CAST XFAST 3X15 (CAST SUPPLIES) IMPLANT
SPLINT PLASTER XTRA FASTSET 3X (CAST SUPPLIES)
SPONGE GAUZE 4X4 12PLY (GAUZE/BANDAGES/DRESSINGS) ×2 IMPLANT
STOCKINETTE 4X48 STRL (DRAPES) ×2 IMPLANT
SUT ETHILON 4 0 PS 2 18 (SUTURE) IMPLANT
SWAB COLLECTION DEVICE MRSA (MISCELLANEOUS) ×1 IMPLANT
SYR BULB 3OZ (MISCELLANEOUS) ×2 IMPLANT
SYR CONTROL 10ML LL (SYRINGE) ×1 IMPLANT
TOWEL OR 17X24 6PK STRL BLUE (TOWEL DISPOSABLE) ×3 IMPLANT
TUBE ANAEROBIC SPECIMEN COL (MISCELLANEOUS) ×1 IMPLANT
TUBE FEEDING 5FR 15 INCH (TUBING) IMPLANT
UNDERPAD 30X30 INCONTINENT (UNDERPADS AND DIAPERS) ×2 IMPLANT

## 2012-12-03 NOTE — Brief Op Note (Signed)
12/03/2012  8:05 AM  PATIENT:  Jonathon [name]  47 y.o. male  PRE-OPERATIVE DIAGNOSIS:  RIGHT HAND ABSCESS   POST-OPERATIVE DIAGNOSIS:  RIGHT HAND ABSCESS   PROCEDURE:  Procedure(s): IRRIGATION AND DEBRIDEMENT RIGHT HAND ABSCESS  (Right)  SURGEON:  Surgeon(s) and Role:    * Tami Ribas, MD - Primary  PHYSICIAN ASSISTANT:   ASSISTANTS: none   ANESTHESIA:   general  EBL:     BLOOD ADMINISTERED:none  DRAINS: iodoform packing  LOCAL MEDICATIONS USED:  MARCAINE     SPECIMEN:  Source of Specimen:  right palm  DISPOSITION OF SPECIMEN:  micro  COUNTS:  YES  TOURNIQUET:   Total Tourniquet Time Documented: Upper Arm (Right) - 17 minutes Total: Upper Arm (Right) - 17 minutes   DICTATION: .Other Dictation: Dictation Number [id number]  PLAN OF CARE: Discharge to home after PACU  PATIENT DISPOSITION:  PACU - hemodynamically stable.

## 2012-12-03 NOTE — Anesthesia Preprocedure Evaluation (Signed)
Anesthesia Evaluation  Patient identified by MRN, date of birth, ID band Patient awake    Reviewed: Allergy & Precautions, H&P , NPO status , Patient's Chart, lab work & pertinent test results  Airway Mallampati: II TM Distance: >3 FB Neck ROM: Full    Dental  (+) Teeth Intact and Dental Advisory Given   Pulmonary  breath sounds clear to auscultation        Cardiovascular hypertension, Pt. on medications Rhythm:Regular Rate:Normal     Neuro/Psych    GI/Hepatic   Endo/Other  diabetes, Well Controlled, Type 2, Oral Hypoglycemic AgentsMorbid obesity  Renal/GU      Musculoskeletal   Abdominal   Peds  Hematology   Anesthesia Other Findings   Reproductive/Obstetrics                           Anesthesia Physical Anesthesia Plan  ASA: III  Anesthesia Plan: General   Post-op Pain Management:    Induction: Intravenous  Airway Management Planned: LMA  Additional Equipment:   Intra-op Plan:   Post-operative Plan: Extubation in OR  Informed Consent: I have reviewed the patients History and Physical, chart, labs and discussed the procedure including the risks, benefits and alternatives for the proposed anesthesia with the patient or authorized representative who has indicated his/her understanding and acceptance.   Dental advisory given  Plan Discussed with: CRNA, Anesthesiologist and Surgeon  Anesthesia Plan Comments:         Anesthesia Quick Evaluation

## 2012-12-03 NOTE — Anesthesia Procedure Notes (Addendum)
Performed by: Gar Gibbon   Procedure Name: LMA Insertion Date/Time: 12/03/2012 7:34 AM Performed by: Gar Gibbon Pre-anesthesia Checklist: Patient identified, Emergency Drugs available, Suction available and Patient being monitored Patient Re-evaluated:Patient Re-evaluated prior to inductionOxygen Delivery Method: Circle System Utilized Preoxygenation: Pre-oxygenation with 100% oxygen Intubation Type: IV induction Ventilation: Mask ventilation without difficulty LMA: LMA with gastric port inserted LMA Size: 5.0 Number of attempts: 1 Placement Confirmation: positive ETCO2 Tube secured with: Tape Dental Injury: Teeth and Oropharynx as per pre-operative assessment

## 2012-12-03 NOTE — Transfer of Care (Signed)
Immediate Anesthesia Transfer of Care Note  Patient: Jonathon [name]  Procedure(s) Performed: Procedure(s): IRRIGATION AND DEBRIDEMENT RIGHT HAND ABSCESS  (Right)  Patient Location: PACU  Anesthesia Type:General  Level of Consciousness: awake and patient cooperative  Airway & Oxygen Therapy: Patient Spontanous Breathing and Patient connected to face mask oxygen  Post-op Assessment: Report given to PACU RN and Post -op Vital signs reviewed and stable  Post vital signs: Reviewed and stable  Complications: No apparent anesthesia complications

## 2012-12-03 NOTE — Op Note (Signed)
NAMEANGELINA, NEECE.:  [id number]  MEDICAL RECORD NO.:  [id number]  LOCATION:                                 FACILITY:  PHYSICIAN:  Betha Loa, MD             DATE OF BIRTH:  DATE OF PROCEDURE:  12/03/2012 DATE OF DISCHARGE:                              OPERATIVE REPORT   PREOPERATIVE DIAGNOSIS:  Right palm abscess.  POSTOPERATIVE DIAGNOSIS:  Right palm abscess.  PROCEDURE:  Irrigation and debridement with sharp debridement of skin and subcutaneous tissues using scalpel and scissors.  SURGEON:  Betha Loa, MD  ASSISTANT:  None.  ANESTHESIA:  General.  IV FLUIDS:  Per anesthesia flow sheet.  ESTIMATED BLOOD LOSS:  Minimal.  COMPLICATIONS:  None.  SPECIMENS:  Cultures to micro.  TOURNIQUET TIME:  70 minutes.  DISPOSITION:  Stable to PACU.  INDICATIONS:  [name] is a 47 year old right-hand dominant male who states that approximately 1 week ago after clean up for party he noted a spot on his right palm that was painful.  It began to become more painful and erythematous.  He followed up with his primary care physician who referred me last week.  On evaluation, he did not have any fluctuance.  We started him on antibiotics.  He followed up yesterday. There was an area of fluctuance and apparent collection underneath the skin.  We discussed nonoperative and operative treatment options.  I recommended irrigation and debridement.  Risks, benefits, and alternatives of surgery were discussed including risk of blood loss, infection, damage to nerves, vessels, tendons, ligaments, bone, failure of surgery, need for additional surgery, complications with wound healing, continued pain, continued abscess.  He voiced understanding of these risks and elected to proceed.  OPERATIVE COURSE:  After being identified preoperatively by myself, the patient and I agreed upon procedure and site of procedure.  Surgical site was marked.  The risks, benefits,  and alternatives of surgery were reviewed and he wished to proceed.  Surgical consent had been signed. He was transported to the operating room and placed on the operating room table in supine position with the right upper extremity on arm board.  General anesthesia was induced by anesthesiologist.  The right upper extremity was prepped and draped in normal sterile orthopedic fashion.  A surgical pause was performed between surgeons, anesthesia, and operating room staff, and all were in agreement as to the patient, procedure, and site of procedure.  Tourniquet at the proximal aspect of the extremity was inflated to 250 mmHg after gravity exsanguination of the hand and Esmarch exsanguination of the forearm.  A Brunner type incision was made on the ulnar side of the palm at the level of the palmar flexion crease in the area of the fluctuance.  There was gross purulence from under the skin.  The cultures for aerobes, anaerobes, and Gram stain were taken.  The subcutaneous tissues were entered by spreading technique.  There was some necrotic fat, this was removed using the scissors.   The common digital nerve and artery to the ring and small finger webspace was  identified and  protected throughout the case.  There was no gross purulence  coming from the deeper tissues.  The area of the blood blister on the skin  Was Excised using a scalpel.  It appears that this was the likely source of  the infection. The wound was copiously irrigated with sterile saline and then packed with Iodoform packing.  It was injected with 9 mL of 0.25% plain Marcaine to aid in postoperative analgesia.  The wound was then dressed with sterile 4x4s and ABD and wrapped with Kerlix and Ace bandage. Tourniquet was deflated at 17 minutes.  Fingertips were pink with brisk capillary refill after deflation of the tourniquet.  Operative drapes were broken down and the patient was awoken from anesthesia safely.  He was  transferred back to stretcher and taken to PACU in stable condition. I will see him back in the office in 3 days for postoperative followup. I will give him Norco 5/325, 1-2 p.o. q.6 hours p.r.n. pain, dispensed #30 and Bactrim DS 1 p.o. b.i.d. x7 days.     Betha Loa, MD     KK/MEDQ  D:  12/03/2012  T:  12/03/2012  Job:  [id number]

## 2012-12-03 NOTE — Op Note (Signed)
[id number] 

## 2012-12-03 NOTE — H&P (Signed)
Jonathon [name] is an 47 y.o. male.   Chief Complaint: right hand abscess HPI: 47 yo rhd male with abscess in right hand.  States it started 1 week ago after cleaning up after a party at his home.  No specific injury or wound noted.  Progressively increasing  Pain and erythema in palm of right hand.  Seen last week in office and started on antibiotics.  Some improvement, but with area of fluctuance on follow up.  Past Medical History  Diagnosis Date  . T2DM (type 2 diabetes mellitus) 2010    prior on levemir  . HTN (hypertension)   . HLD (hyperlipidemia)   . Obesity   . Family history of prostate cancer     father, brother  . History of chronic prostatitis 2012    granulomatous, CXR done - negative  . Elevated PSA     with L lobe induration on DRE, normal biopsy 2012 ([facility])  . Genital herpes   . ED (erectile dysfunction) of organic origin   . Seasonal allergies     Past Surgical History  Procedure Laterality Date  . Prostate biopsy  2012    granulomatous prostatitis  . Ett  2012    WNL ([facility])    Family History  Problem Relation Age of Onset  . Cancer Father 36    colon, prostate  . Hypertension Father   . Cancer Brother 94    prostate  . Diabetes Mother   . Diabetes Father   . Diabetes Sister   . Stroke Father 74  . Coronary artery disease Father 4    MI   Social History:  reports that he has never smoked. He has never used smokeless tobacco. He reports that  drinks alcohol. He reports that he does not use illicit drugs.  Allergies: No Known Allergies  Medications Prior to Admission  Medication Sig Dispense Refill  . atorvastatin (LIPITOR) 10 MG tablet Take 1 tablet (10 mg total) by mouth at bedtime.  30 tablet  11  . diphenhydrAMINE (BENADRYL) 25 MG tablet Take 25 mg by mouth every 6 (six) hours as needed for itching.      Marland Kitchen glimepiride (AMARYL) 4 MG tablet Take 4 mg by mouth 2 (two) times daily.      Marland Kitchen lisinopril (PRINIVIL,ZESTRIL) 10 MG tablet Take 1  tablet (10 mg total) by mouth daily.  30 tablet  11  . loratadine (CLARITIN) 10 MG tablet Take 10 mg by mouth as needed for allergies.      . metFORMIN (GLUCOPHAGE) 1000 MG tablet Take 1 tablet (1,000 mg total) by mouth 2 (two) times daily with a meal.  60 tablet  11  . sitaGLIPtin (JANUVIA) 50 MG tablet Take 1 tablet (50 mg total) by mouth daily.  30 tablet  11  . sulfamethoxazole-trimethoprim (BACTRIM DS,SEPTRA DS) 800-160 MG per tablet Take 2 tablets by mouth 2 (two) times daily.  40 tablet  0  . Tadalafil (CIALIS) 2.5 MG TABS Take 1 tablet (2.5 mg total) by mouth daily.  30 tablet  1  . valACYclovir (VALTREX) 500 MG tablet TAKE 1 TABLET BY MOUTH TWICE A DAY AS DIRECTED  30 tablet  3    Results for orders placed during the hospital encounter of 12/03/12 (from the past 48 hour(s))  BASIC METABOLIC PANEL     Status: Abnormal   Collection Time    12/02/12  4:00 PM      Result Value Range   Sodium 139  135 - 145 mEq/L   Potassium 4.4  3.5 - 5.1 mEq/L   Chloride 104  96 - 112 mEq/L   CO2 21  19 - 32 mEq/L   Glucose, Bld 146 (*) 70 - 99 mg/dL   BUN 13  6 - 23 mg/dL   Creatinine, Ser 1.61  0.50 - 1.35 mg/dL   Calcium 9.9  8.4 - 09.6 mg/dL   GFR calc non Af Amer >90  >90 mL/min   GFR calc Af Amer >90  >90 mL/min   Comment:            The eGFR has been calculated     using the CKD EPI equation.     This calculation has not been     validated in all clinical     situations.     eGFR's persistently     <90 mL/min signify     possible Chronic Kidney Disease.  GLUCOSE, CAPILLARY     Status: None   Collection Time    12/03/12  6:43 AM      Result Value Range   Glucose-Capillary 99  70 - 99 mg/dL  POCT HEMOGLOBIN-HEMACUE     Status: None   Collection Time    12/03/12  6:45 AM      Result Value Range   Hemoglobin 15.2  13.0 - 17.0 g/dL    No results found.   A comprehensive review of systems was negative except for: Behavioral/Psych: positive for sleep disturbance  Blood pressure  134/80, pulse 68, temperature 98.1 F (36.7 C), temperature source Oral, resp. rate 16, height 6\' 1"  (1.854 m), weight 138.347 kg (305 lb), SpO2 98.00%.  General appearance: alert, cooperative and appears stated age Head: Normocephalic, without obvious abnormality, atraumatic Neck: supple, symmetrical, trachea midline Resp: clear to auscultation bilaterally Cardio: regular rate and rhythm GI: non tender Extremities: intact sensation and capillary refill all digits.  +epl/fpl/io.  ttp volar ulnar side of right palm.  area of skin change and small amount of fluctuance.  no streaks. Pulses: 2+ and symmetric Skin: as above Neurologic: Grossly normal Incision/Wound: na  Assessment/Plan Right palmar abscess.  Non operative and operative treatment options were discussed with the patient and patient wishes to proceed with operative treatment. Risks, benefits, and alternatives of surgery were discussed and the patient agrees with the plan of care.   Aviendha Azbell R 12/03/2012, 7:23 AM

## 2012-12-03 NOTE — Anesthesia Postprocedure Evaluation (Signed)
  Anesthesia Post-op Note  Patient: Jonathon [name]  Procedure(s) Performed: Procedure(s): IRRIGATION AND DEBRIDEMENT RIGHT HAND ABSCESS  (Right)  Patient Location: PACU  Anesthesia Type:General  Level of Consciousness: awake, alert  and oriented  Airway and Oxygen Therapy: Patient Spontanous Breathing  Post-op Pain: mild  Post-op Assessment: Post-op Vital signs reviewed  Post-op Vital Signs: Reviewed  Complications: No apparent anesthesia complications

## 2012-12-04 ENCOUNTER — Encounter (HOSPITAL_BASED_OUTPATIENT_CLINIC_OR_DEPARTMENT_OTHER): Payer: Self-pay | Admitting: Orthopedic Surgery

## 2012-12-05 LAB — WOUND CULTURE

## 2012-12-08 LAB — ANAEROBIC CULTURE: Gram Stain: NONE SEEN

## 2013-01-23 ENCOUNTER — Other Ambulatory Visit: Payer: Self-pay

## 2013-01-24 ENCOUNTER — Other Ambulatory Visit: Payer: Self-pay | Admitting: Family Medicine

## 2013-01-24 NOTE — Telephone Encounter (Signed)
Electronic refill request.  Please advise. 

## 2013-01-26 NOTE — Telephone Encounter (Signed)
plz clarify with patient- is he taking prn 10mg  dose or daily 2.5mg  dose?

## 2013-02-25 ENCOUNTER — Other Ambulatory Visit: Payer: Self-pay

## 2013-02-25 MED ORDER — TADALAFIL 10 MG PO TABS: ORAL_TABLET | ORAL | Status: DC

## 2013-02-25 NOTE — Telephone Encounter (Signed)
Plz notify this was sent in.  ?

## 2013-02-25 NOTE — Telephone Encounter (Signed)
Pt left v/m requesting refill cialis to [facility].Please advise.

## 2013-03-03 ENCOUNTER — Telehealth: Payer: Self-pay

## 2013-03-03 NOTE — Telephone Encounter (Signed)
Prior auth needed for Cialis; form in Dr [employer] in box.

## 2013-03-04 ENCOUNTER — Other Ambulatory Visit: Payer: Self-pay | Admitting: Family Medicine

## 2013-03-04 NOTE — Telephone Encounter (Signed)
Filled and placed in my out box. 

## 2013-03-04 NOTE — Telephone Encounter (Signed)
Pt called for status of cialis refill. Advised pt PA process; pt voiced understanding.

## 2013-03-10 ENCOUNTER — Other Ambulatory Visit: Payer: Self-pay | Admitting: Family Medicine

## 2013-03-10 DIAGNOSIS — I10 Essential (primary) hypertension: Secondary | ICD-10-CM

## 2013-03-10 DIAGNOSIS — E785 Hyperlipidemia, unspecified: Secondary | ICD-10-CM

## 2013-03-10 DIAGNOSIS — E119 Type 2 diabetes mellitus without complications: Secondary | ICD-10-CM

## 2013-03-10 DIAGNOSIS — Z8042 Family history of malignant neoplasm of prostate: Secondary | ICD-10-CM

## 2013-03-11 NOTE — Telephone Encounter (Signed)
Approval letter received and placed in Dr Timoteo Expose in box for signature and scanning; [facility] notified and med went thru, pharmacy will notify pt.

## 2013-03-18 ENCOUNTER — Other Ambulatory Visit: Payer: BC Managed Care – PPO

## 2013-03-25 ENCOUNTER — Encounter: Payer: BC Managed Care – PPO | Admitting: Family Medicine

## 2013-04-07 ENCOUNTER — Other Ambulatory Visit: Payer: Self-pay | Admitting: Family Medicine

## 2013-05-06 ENCOUNTER — Encounter: Payer: BC Managed Care – PPO | Admitting: Family Medicine

## 2013-05-06 DIAGNOSIS — Z0289 Encounter for other administrative examinations: Secondary | ICD-10-CM

## 2013-05-26 ENCOUNTER — Telehealth: Payer: Self-pay

## 2013-05-26 NOTE — Telephone Encounter (Signed)
Pt left v/m; received letter from [facility] about switching from Cialis to [place]; pt would like to try the new med if can save pt money with same results.[facility] [address]

## 2013-05-27 MED ORDER — AVANAFIL 100 MG PO TABS: 50.0000 mg | ORAL_TABLET | Freq: Every day | ORAL | Status: DC | PRN

## 2013-05-27 NOTE — Telephone Encounter (Signed)
plz notify I've sent in stendra trial for patient.

## 2013-05-27 NOTE — Telephone Encounter (Signed)
Patient notified

## 2013-05-29 ENCOUNTER — Telehealth: Payer: Self-pay | Admitting: *Deleted

## 2013-05-29 NOTE — Telephone Encounter (Signed)
Received prior auth request for [place] from pharmacy. I requested and completed forms from ins company. Med was approved from 05/28/13-05/28/14. Pharmacy notified via fax. Paperwork placed in Dr. Timoteo Expose inbox.

## 2013-05-29 NOTE — Telephone Encounter (Signed)
Will sign and send for scanning.

## 2013-06-02 ENCOUNTER — Other Ambulatory Visit: Payer: Self-pay | Admitting: Family Medicine

## 2013-06-27 ENCOUNTER — Other Ambulatory Visit: Payer: Self-pay | Admitting: Family Medicine

## 2013-07-03 ENCOUNTER — Other Ambulatory Visit: Payer: Self-pay

## 2013-07-03 MED ORDER — AVANAFIL 100 MG PO TABS: ORAL_TABLET | ORAL | Status: DC

## 2013-07-03 NOTE — Telephone Encounter (Signed)
plz notify #10 sent in. Have him check if there's a limit to # per month by insurance.

## 2013-07-03 NOTE — Telephone Encounter (Signed)
Pt left v/m Jerral Ralph is not lasting 30 days and pt request refill with larger quantity. [facility] Refilled on 06/27/13 #6 x 1. Pt request cb.

## 2013-07-03 NOTE — Telephone Encounter (Signed)
Patient notified and will check with insurance and let me know.

## 2013-07-14 ENCOUNTER — Other Ambulatory Visit: Payer: Self-pay

## 2013-07-14 MED ORDER — GLUCOSE BLOOD VI STRP: ORAL_STRIP | Status: DC

## 2013-07-14 NOTE — Telephone Encounter (Signed)
Pt request refill on one touch ultra test strips to [facility].advised done.

## 2013-07-29 ENCOUNTER — Other Ambulatory Visit: Payer: Self-pay | Admitting: Family Medicine

## 2013-09-06 ENCOUNTER — Other Ambulatory Visit: Payer: Self-pay | Admitting: Family Medicine

## 2013-10-01 ENCOUNTER — Encounter: Payer: Self-pay | Admitting: Internal Medicine

## 2013-10-01 ENCOUNTER — Ambulatory Visit (INDEPENDENT_AMBULATORY_CARE_PROVIDER_SITE_OTHER): Payer: 59 | Admitting: Internal Medicine

## 2013-10-01 VITALS — BP 126/78 | HR 95 | Temp 97.5°F | Wt 305.0 lb

## 2013-10-01 DIAGNOSIS — H6120 Impacted cerumen, unspecified ear: Secondary | ICD-10-CM

## 2013-10-01 DIAGNOSIS — H918X9 Other specified hearing loss, unspecified ear: Secondary | ICD-10-CM

## 2013-10-01 NOTE — Progress Notes (Signed)
HPI  Pt presents to the office today with concerns of left ear hearing loss. This started about 2 days ago. He endorses some pressure to ear, denies any pain, drainage, or blood from ear. He does get hearing back at times. He has tried OTC Debrox drops and admits to using Qtips often.   Past Medical History  Diagnosis Date  . T2DM (type 2 diabetes mellitus) 2010    prior on levemir  . HTN (hypertension)   . HLD (hyperlipidemia)   . Obesity   . Family history of prostate cancer     father, brother  . History of chronic prostatitis 2012    granulomatous, CXR done - negative  . Elevated PSA     with L lobe induration on DRE, normal biopsy 2012 ([facility])  . Genital herpes   . ED (erectile dysfunction) of organic origin   . Seasonal allergies     Current Outpatient Prescriptions  Medication Sig Dispense Refill  . atorvastatin (LIPITOR) 10 MG tablet TAKE 1 TABLET (10 MG TOTAL) BY MOUTH AT BEDTIME.  30 tablet  5  . diphenhydrAMINE (BENADRYL) 25 MG tablet Take 25 mg by mouth every 6 (six) hours as needed for itching.      Marland Kitchen doxycycline (VIBRAMYCIN) 50 MG capsule Take 2 capsules (100 mg total) by mouth 2 (two) times daily.  28 capsule  0  . glimepiride (AMARYL) 4 MG tablet TAKE 2 TABLETS BY MOUTH EVERY DAY BEFORE BREAKFAST  60 tablet  6  . glucose blood (ONE TOUCH ULTRA TEST) test strip Check blood sugar twice a day and as instructed. Dx 250.00  100 each  3  . HYDROcodone-acetaminophen (NORCO) 5-325 MG per tablet 1-2 tabs po q6 hours prn pain  30 tablet  0  . lisinopril (PRINIVIL,ZESTRIL) 10 MG tablet TAKE 1 TABLET (10 MG TOTAL) BY MOUTH DAILY.  30 tablet  5  . loratadine (CLARITIN) 10 MG tablet Take 10 mg by mouth as needed for allergies.      . metFORMIN (GLUCOPHAGE) 1000 MG tablet TAKE 1 TABLET (1,000 MG TOTAL) BY MOUTH 2 (TWO) TIMES DAILY WITH A MEAL.  60 tablet  5  . sitaGLIPtin (JANUVIA) 50 MG tablet Take 1 tablet (50 mg total) by mouth daily.  30 tablet  11  . valACYclovir (VALTREX)  500 MG tablet TAKE 1 TABLET BY MOUTH TWICE A DAY AS DIRECTED  30 tablet  3  . STENDRA 100 MG TABS TAKE 1/2 - 1 TABLET (50-100 MG) BY MOUTH DAILY AS NEEDED.  6 tablet  1   No current facility-administered medications for this visit.    No Known Allergies  Family History  Problem Relation Age of Onset  . Cancer Father 63    colon, prostate  . Hypertension Father   . Cancer Brother 76    prostate  . Diabetes Mother   . Diabetes Father   . Diabetes Sister   . Stroke Father 53  . Coronary artery disease Father 68    MI    History   Social History  . Marital Status: Married    Spouse Name: N/A    Number of Children: N/A  . Years of Education: N/A   Occupational History  . Not on file.   Social History Main Topics  . Smoking status: Never Smoker   . Smokeless tobacco: Never Used  . Alcohol Use: Yes     Comment: occasional  . Drug Use: No  . Sexual Activity: Not on file  Other Topics Concern  . Not on file   Social History Narrative   Lives with wife.  Has 5 children but none live at home.   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   Edu: HS   Activity: walks 2 mi 1x/wk   Diet: good water (5-6 glasses), 8oz grape juice at night    ROS:  Constitutional: Denies fever, malaise, fatigue, headache or abrupt weight changes.  HEENT: Endorses left ear hearing loss.  Denies eye pain, eye redness, ear pain, ringing in the ears, runny nose, nasal congestion, bloody nose, or sore throat. Respiratory: Denies difficulty breathing, shortness of breath, cough or sputum production.   Cardiovascular: Denies chest pain, chest tightness, palpitations or swelling in the hands or feet.     No other specific complaints in a complete review of systems (except as listed in HPI above).  PE:  Wt 305 lb (138.347 kg) Wt Readings from Last 3 Encounters:  10/01/13 305 lb (138.347 kg)  12/02/12 305 lb (138.347 kg)  12/02/12 305 lb (138.347 kg)    General: Appears their stated age,  well developed, well nourished in NAD. HEENT: Head: normal shape and size; Eyes: sclera white, no icterus, conjunctiva pink, PERRLA and EOMs intact; Ears: Tm's gray and intact, normal light reflex; Nose: mucosa pink and moist, septum midline; Throat/Mouth: Teeth present, mucosa pink and moist, no lesions or ulcerations noted.  Neck: Normal range of motion. Neck supple, trachea midline. No massses, lumps or thyromegaly present.  Cardiovascular: Normal rate and rhythm. S1,S2 noted.  No murmur, rubs or gallops noted. No JVD or BLE edema. No carotid bruits noted. Pulmonary/Chest: Normal effort and positive vesicular breath sounds. No respiratory distress. No wheezes, rales or ronchi noted.    EKG:  BMET    Component Value Date/Time   NA 139 12/02/2012 1600   K 4.4 12/02/2012 1600   CL 104 12/02/2012 1600   CO2 21 12/02/2012 1600   GLUCOSE 146* 12/02/2012 1600   BUN 13 12/02/2012 1600   CREATININE 0.99 12/02/2012 1600   CREATININE 0.92 09/25/2011   CALCIUM 9.9 12/02/2012 1600   GFRNONAA >90 12/02/2012 1600   GFRAA >90 12/02/2012 1600    Lipid Panel     Component Value Date/Time   CHOL 166 11/20/2012 1104   TRIG 199.0* 11/20/2012 1104   HDL 36.70* 11/20/2012 1104   CHOLHDL 5 11/20/2012 1104   VLDL 39.8 11/20/2012 1104   LDLCALC 90 11/20/2012 1104    CBC    Component Value Date/Time   WBC 7.1 11/28/2012 1223   RBC 4.64 11/28/2012 1223   HGB 15.2 12/03/2012 0645   HCT 42.5 11/28/2012 1223   PLT 250.0 11/28/2012 1223   MCV 91.5 11/28/2012 1223   MCHC 34.1 11/28/2012 1223   RDW 13.9 11/28/2012 1223   LYMPHSABS 3.1 11/28/2012 1223   MONOABS 0.6 11/28/2012 1223   EOSABS 0.1 11/28/2012 1223   BASOSABS 0.0 11/28/2012 1223    Hgb A1C Lab Results  Component Value Date   HGBA1C 8.4* 11/20/2012     Assessment and Plan:

## 2013-10-01 NOTE — Patient Instructions (Addendum)
Cerumen Impaction A cerumen impaction is when the wax in your ear forms a plug. This plug usually causes reduced hearing. Sometimes it also causes an earache or dizziness. Removing a cerumen impaction can be difficult and painful. The wax sticks to the ear canal. The canal is sensitive and bleeds easily. If you try to remove a heavy wax buildup with a cotton tipped swab, you may push it in further. Irrigation with water, suction, and small ear curettes may be used to clear out the wax. If the impaction is fixed to the skin in the ear canal, ear drops may be needed for a few days to loosen the wax. People who build up a lot of wax frequently can use ear wax removal products available in your local drugstore. SEEK MEDICAL CARE IF:  You develop an earache, increased hearing loss, or marked dizziness. Document Released: 08/10/2004 Document Revised: 09/25/2011 Document Reviewed: 09/30/2009 Exit[employer] Patient Information 2014 [employer].  

## 2013-10-01 NOTE — Progress Notes (Signed)
Pre visit review using our clinic review tool, if applicable. No additional management support is needed unless otherwise documented below in the visit note. 

## 2013-10-01 NOTE — Progress Notes (Signed)
Subjective:    Patient ID: Jonathon [name], male    DOB: [birth year], 48 y.o.   MRN: [id number]  HPI  Pt presents to the clinic today with c/o hearing loss. He reports this started 2 days ago. He also has ear pressure. He has tried Debrox OTC with some relief. He denies discharge from the ear. He denies trauma to the head.  Review of Systems      Past Medical History  Diagnosis Date  . T2DM (type 2 diabetes mellitus) 2010    prior on levemir  . HTN (hypertension)   . HLD (hyperlipidemia)   . Obesity   . Family history of prostate cancer     father, brother  . History of chronic prostatitis 2012    granulomatous, CXR done - negative  . Elevated PSA     with L lobe induration on DRE, normal biopsy 2012 ([facility])  . Genital herpes   . ED (erectile dysfunction) of organic origin   . Seasonal allergies     Current Outpatient Prescriptions  Medication Sig Dispense Refill  . atorvastatin (LIPITOR) 10 MG tablet TAKE 1 TABLET (10 MG TOTAL) BY MOUTH AT BEDTIME.  30 tablet  5  . diphenhydrAMINE (BENADRYL) 25 MG tablet Take 25 mg by mouth every 6 (six) hours as needed for itching.      Marland Kitchen doxycycline (VIBRAMYCIN) 50 MG capsule Take 2 capsules (100 mg total) by mouth 2 (two) times daily.  28 capsule  0  . glimepiride (AMARYL) 4 MG tablet TAKE 2 TABLETS BY MOUTH EVERY DAY BEFORE BREAKFAST  60 tablet  6  . glucose blood (ONE TOUCH ULTRA TEST) test strip Check blood sugar twice a day and as instructed. Dx 250.00  100 each  3  . HYDROcodone-acetaminophen (NORCO) 5-325 MG per tablet 1-2 tabs po q6 hours prn pain  30 tablet  0  . lisinopril (PRINIVIL,ZESTRIL) 10 MG tablet TAKE 1 TABLET (10 MG TOTAL) BY MOUTH DAILY.  30 tablet  5  . loratadine (CLARITIN) 10 MG tablet Take 10 mg by mouth as needed for allergies.      . metFORMIN (GLUCOPHAGE) 1000 MG tablet TAKE 1 TABLET (1,000 MG TOTAL) BY MOUTH 2 (TWO) TIMES DAILY WITH A MEAL.  60 tablet  5  . sitaGLIPtin (JANUVIA) 50 MG tablet Take 1 tablet (50 mg  total) by mouth daily.  30 tablet  11  . valACYclovir (VALTREX) 500 MG tablet TAKE 1 TABLET BY MOUTH TWICE A DAY AS DIRECTED  30 tablet  3  . STENDRA 100 MG TABS TAKE 1/2 - 1 TABLET (50-100 MG) BY MOUTH DAILY AS NEEDED.  6 tablet  1   No current facility-administered medications for this visit.    No Known Allergies  Family History  Problem Relation Age of Onset  . Cancer Father 70    colon, prostate  . Hypertension Father   . Cancer Brother 19    prostate  . Diabetes Mother   . Diabetes Father   . Diabetes Sister   . Stroke Father 64  . Coronary artery disease Father 47    MI    History   Social History  . Marital Status: Married    Spouse Name: N/A    Number of Children: N/A  . Years of Education: N/A   Occupational History  . Not on file.   Social History Main Topics  . Smoking status: Never Smoker   . Smokeless tobacco: Never Used  . Alcohol Use:  Yes     Comment: occasional  . Drug Use: No  . Sexual Activity: Not on file   Other Topics Concern  . Not on file   Social History Narrative   Lives with wife.  Has 5 children but none live at home.   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   Edu: HS   Activity: walks 2 mi 1x/wk   Diet: good water (5-6 glasses), 8oz grape juice at night     Constitutional: Denies fever, malaise, fatigue, headache or abrupt weight changes.  HEENT: Pt reports ear pressure and loss of hearing. Denies eye pain, eye redness, ear pain, ringing in the ears, wax buildup, runny nose, nasal congestion, bloody nose, or sore throat.  Neurological: Denies dizziness, difficulty with memory, difficulty with speech or problems with balance and coordination.   No other specific complaints in a complete review of systems (except as listed in HPI above).  Objective:   Physical Exam  BP 126/78  Pulse 95  Temp(Src) 97.5 F (36.4 C) (Oral)  Wt 305 lb (138.347 kg)  SpO2 98% Wt Readings from Last 3 Encounters:  10/01/13 305 lb (138.347  kg)  12/02/12 305 lb (138.347 kg)  12/02/12 305 lb (138.347 kg)    General: Appears his stated age, well developed, well nourished in NAD. HEENT: Head: normal shape and size; Eyes: sclera white, no icterus, conjunctiva pink, PERRLA and EOMs intact; Ears: left ear cerumen impaction; Nose: mucosa pink and moist, septum midline; Throat/Mouth: Teeth present, mucosa pink and moist, no exudate, lesions or ulcerations noted.   Cardiovascular: Normal rate and rhythm. S1,S2 noted.  No murmur, rubs or gallops noted. No JVD or BLE edema. No carotid bruits noted. Pulmonary/Chest: Normal effort and positive vesicular breath sounds. No respiratory distress. No wheezes, rales or ronchi noted.   Neurological: Alert and oriented. Cranial nerves II-XII intact. Coordination normal. +DTRs bilaterally.    BMET    Component Value Date/Time   NA 139 12/02/2012 1600   K 4.4 12/02/2012 1600   CL 104 12/02/2012 1600   CO2 21 12/02/2012 1600   GLUCOSE 146* 12/02/2012 1600   BUN 13 12/02/2012 1600   CREATININE 0.99 12/02/2012 1600   CREATININE 0.92 09/25/2011   CALCIUM 9.9 12/02/2012 1600   GFRNONAA >90 12/02/2012 1600   GFRAA >90 12/02/2012 1600    Lipid Panel     Component Value Date/Time   CHOL 166 11/20/2012 1104   TRIG 199.0* 11/20/2012 1104   HDL 36.70* 11/20/2012 1104   CHOLHDL 5 11/20/2012 1104   VLDL 39.8 11/20/2012 1104   LDLCALC 90 11/20/2012 1104    CBC    Component Value Date/Time   WBC 7.1 11/28/2012 1223   RBC 4.64 11/28/2012 1223   HGB 15.2 12/03/2012 0645   HCT 42.5 11/28/2012 1223   PLT 250.0 11/28/2012 1223   MCV 91.5 11/28/2012 1223   MCHC 34.1 11/28/2012 1223   RDW 13.9 11/28/2012 1223   LYMPHSABS 3.1 11/28/2012 1223   MONOABS 0.6 11/28/2012 1223   EOSABS 0.1 11/28/2012 1223   BASOSABS 0.0 11/28/2012 1223    Hgb A1C Lab Results  Component Value Date   HGBA1C 8.4* 11/20/2012         Assessment & Plan:   Left ear cerumen impaction:  Manual lavage by CMA Ok to use Debrox OTC x 4 days If no  improvement in hearing loss, RTC by Friday  RTC as needed or if symptoms persist or worsen

## 2013-11-19 ENCOUNTER — Ambulatory Visit: Payer: 59 | Admitting: Family Medicine

## 2013-11-19 DIAGNOSIS — Z0289 Encounter for other administrative examinations: Secondary | ICD-10-CM

## 2013-12-13 ENCOUNTER — Other Ambulatory Visit: Payer: Self-pay | Admitting: Family Medicine

## 2014-01-14 ENCOUNTER — Other Ambulatory Visit: Payer: Self-pay | Admitting: Family Medicine

## 2014-01-15 ENCOUNTER — Other Ambulatory Visit: Payer: Self-pay | Admitting: Family Medicine

## 2014-01-23 ENCOUNTER — Telehealth: Payer: Self-pay | Admitting: *Deleted

## 2014-01-23 NOTE — Telephone Encounter (Signed)
**  DIABETIC BUNDLE**   Left voicemail requesting pt to call office and schedule a fasting lab appt (check lipid profile and a1c), then a f/u with Dr. Reece AgarG after lab appt

## 2014-02-12 ENCOUNTER — Telehealth: Payer: Self-pay | Admitting: Family Medicine

## 2014-02-12 NOTE — Telephone Encounter (Signed)
Pt left voicemail with triage:  Pt downloaded a coupon for cialis, and he would like to do a trial of the Rx, pt said the coupon has to be signed by Dr. Reece AgarG. Pt wanted to know if he could stop by and have Dr. Reece AgarG sign the coupon so he could get his trial Rx, pt request call back

## 2014-02-12 NOTE — Telephone Encounter (Signed)
Recommend he schedule appt as not seen for DM in >1 yr.

## 2014-02-13 ENCOUNTER — Other Ambulatory Visit: Payer: Self-pay | Admitting: Family Medicine

## 2014-02-13 NOTE — Telephone Encounter (Signed)
Patient notified and appt scheduled.

## 2014-02-16 ENCOUNTER — Other Ambulatory Visit (INDEPENDENT_AMBULATORY_CARE_PROVIDER_SITE_OTHER): Payer: 59

## 2014-02-16 DIAGNOSIS — E119 Type 2 diabetes mellitus without complications: Secondary | ICD-10-CM

## 2014-02-16 DIAGNOSIS — I10 Essential (primary) hypertension: Secondary | ICD-10-CM

## 2014-02-16 DIAGNOSIS — Z8042 Family history of malignant neoplasm of prostate: Secondary | ICD-10-CM

## 2014-02-16 LAB — BASIC METABOLIC PANEL
BUN: 12 mg/dL (ref 6–23)
CHLORIDE: 101 meq/L (ref 96–112)
CO2: 26 meq/L (ref 19–32)
CREATININE: 0.9 mg/dL (ref 0.4–1.5)
Calcium: 9.2 mg/dL (ref 8.4–10.5)
GFR: 110.41 mL/min (ref >60.00)
Glucose, Bld: 137 mg/dL — ABNORMAL HIGH (ref 70–99)
POTASSIUM: 4 meq/L (ref 3.5–5.1)
Sodium: 135 mEq/L (ref 135–145)

## 2014-02-16 LAB — PSA: PSA: 1.92 ng/mL (ref 0.10–4.00)

## 2014-02-16 LAB — HEMOGLOBIN A1C: HEMOGLOBIN A1C: 10.9 % — AB (ref 4.6–6.5)

## 2014-02-23 ENCOUNTER — Other Ambulatory Visit: Payer: Self-pay | Admitting: Family Medicine

## 2014-02-24 ENCOUNTER — Ambulatory Visit: Payer: 59 | Admitting: Family Medicine

## 2014-02-26 ENCOUNTER — Ambulatory Visit: Payer: 59 | Admitting: Family Medicine

## 2014-03-04 ENCOUNTER — Telehealth: Payer: Self-pay | Admitting: Family Medicine

## 2014-03-04 ENCOUNTER — Ambulatory Visit (INDEPENDENT_AMBULATORY_CARE_PROVIDER_SITE_OTHER): Payer: 59 | Admitting: Family Medicine

## 2014-03-04 ENCOUNTER — Encounter: Payer: Self-pay | Admitting: Family Medicine

## 2014-03-04 VITALS — BP 126/66 | HR 72 | Temp 98.3°F | Wt 303.5 lb

## 2014-03-04 DIAGNOSIS — E1140 Type 2 diabetes mellitus with diabetic neuropathy, unspecified: Secondary | ICD-10-CM | POA: Insufficient documentation

## 2014-03-04 DIAGNOSIS — E785 Hyperlipidemia, unspecified: Secondary | ICD-10-CM

## 2014-03-04 DIAGNOSIS — I10 Essential (primary) hypertension: Secondary | ICD-10-CM

## 2014-03-04 DIAGNOSIS — E1142 Type 2 diabetes mellitus with diabetic polyneuropathy: Secondary | ICD-10-CM

## 2014-03-04 DIAGNOSIS — Z113 Encounter for screening for infections with a predominantly sexual mode of transmission: Secondary | ICD-10-CM

## 2014-03-04 DIAGNOSIS — R21 Rash and other nonspecific skin eruption: Secondary | ICD-10-CM

## 2014-03-04 DIAGNOSIS — E1149 Type 2 diabetes mellitus with other diabetic neurological complication: Secondary | ICD-10-CM

## 2014-03-04 DIAGNOSIS — Z77098 Contact with and (suspected) exposure to other hazardous, chiefly nonmedicinal, chemicals: Secondary | ICD-10-CM

## 2014-03-04 DIAGNOSIS — A6000 Herpesviral infection of urogenital system, unspecified: Secondary | ICD-10-CM

## 2014-03-04 DIAGNOSIS — N489 Disorder of penis, unspecified: Secondary | ICD-10-CM

## 2014-03-04 DIAGNOSIS — Z8679 Personal history of other diseases of the circulatory system: Secondary | ICD-10-CM

## 2014-03-04 LAB — POCT URINALYSIS DIPSTICK
Bilirubin, UA: NEGATIVE
GLUCOSE UA: NEGATIVE
KETONES UA: NEGATIVE
LEUKOCYTES UA: NEGATIVE
Nitrite, UA: NEGATIVE
PH UA: 5.5
Protein, UA: NEGATIVE
RBC UA: NEGATIVE
Spec Grav, UA: >=1.03
Urobilinogen, UA: 0.2

## 2014-03-04 LAB — LIPID PANEL
CHOL/HDL RATIO: 8
Cholesterol: 232 mg/dL — ABNORMAL HIGH (ref 0–200)
HDL: 30.7 mg/dL — ABNORMAL LOW (ref >39.00)
NonHDL: 201.3
Triglycerides: 550 mg/dL — ABNORMAL HIGH (ref 0.0–149.0)
VLDL: 110 mg/dL — AB (ref 0.0–40.0)

## 2014-03-04 LAB — CBC WITH DIFFERENTIAL/PLATELET
BASOS PCT: 0.8 % (ref 0.0–3.0)
Basophils Absolute: 0 10*3/uL (ref 0.0–0.1)
EOS PCT: 1.6 % (ref 0.0–5.0)
Eosinophils Absolute: 0.1 10*3/uL (ref 0.0–0.7)
HEMATOCRIT: 45.9 % (ref 39.0–52.0)
HEMOGLOBIN: 15.4 g/dL (ref 13.0–17.0)
LYMPHS ABS: 2.9 10*3/uL (ref 0.7–4.0)
Lymphocytes Relative: 46.5 % — ABNORMAL HIGH (ref 12.0–46.0)
MCHC: 33.5 g/dL (ref 30.0–36.0)
MCV: 91.3 fl (ref 78.0–100.0)
MONOS PCT: 6.6 % (ref 3.0–12.0)
Monocytes Absolute: 0.4 10*3/uL (ref 0.1–1.0)
NEUTROS ABS: 2.7 10*3/uL (ref 1.4–7.7)
Neutrophils Relative %: 44.5 % (ref 43.0–77.0)
Platelets: 268 10*3/uL (ref 150.0–400.0)
RBC: 5.02 Mil/uL (ref 4.22–5.81)
RDW: 14.1 % (ref 11.5–15.5)
WBC: 6.2 10*3/uL (ref 4.0–10.5)

## 2014-03-04 LAB — HEPATIC FUNCTION PANEL
ALBUMIN: 4 g/dL (ref 3.5–5.2)
ALK PHOS: 61 U/L (ref 39–117)
ALT: 27 U/L (ref 0–53)
AST: 17 U/L (ref 0–37)
Bilirubin, Direct: 0 mg/dL (ref 0.0–0.3)
TOTAL PROTEIN: 7.7 g/dL (ref 6.0–8.3)
Total Bilirubin: 0.5 mg/dL (ref 0.2–1.2)

## 2014-03-04 LAB — MICROALBUMIN / CREATININE URINE RATIO
Creatinine,U: 207.5 mg/dL
Microalb Creat Ratio: 1.3 mg/g (ref 0.0–30.0)
Microalb, Ur: 2.6 mg/dL — ABNORMAL HIGH (ref 0.0–1.9)

## 2014-03-04 LAB — LDL CHOLESTEROL, DIRECT: Direct LDL: 95.4 mg/dL

## 2014-03-04 MED ORDER — GABAPENTIN 300 MG PO CAPS: 300.0000 mg | ORAL_CAPSULE | Freq: Two times a day (BID) | ORAL | Status: DC

## 2014-03-04 NOTE — Assessment & Plan Note (Signed)
Discussed gabapentin - will do trial of this.

## 2014-03-04 NOTE — Progress Notes (Signed)
Pre visit review using our clinic review tool, if applicable. No additional management support is needed unless otherwise documented below in the visit note. 

## 2014-03-04 NOTE — Progress Notes (Signed)
BP 126/66  Pulse 72  Temp(Src) 98.3 F (36.8 C) (Oral)  Wt 303 lb 8 oz (137.667 kg)   CC: DM bundle  Subjective:    Patient ID: Jonathon DowdyOrin Buttacavoli, male    DOB: [birth year], 48 y.o.   MRN: [id number]  HPI: Jonathon [name] is a 48 y.o. male presenting on 03/04/2014 for Follow-up   Not seen by myself since early 2014. Fasting today. Presents for diabetic bundle visit.  HLD - complaint with lipitor 10mg  nightly. No myalgias  Blood pressure -not needing lisinopril regularly because bp has been well controlled. BP Readings from Last 3 Encounters:  03/04/14 126/66  10/01/13 126/78  12/03/12 149/95    DM - regularly does check sugars fasting in am: 170.  Compliant with antihyperglycemic regimen which includes: metformin 1000mg  bid, januvia 50mg  daily and amaryl 8mg  with breakfast.  Trouble with taking daily meds 2/2 irregular schedule. 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 Denies low sugars or hypoglycemic symptoms. + paresthesias of hands and feet.  Last diabetic eye exam DUE.  Pneumovax: DUE.  Prevnar: not done.  Penile rash has returned. Some spots on foreskin. Possible urethral discharge. None currently. H/o genital herpes. Currently sexually active, 2 partners in last year. Not concerned for STDs, but doesn't use protection 100% time. H/o chlamydia in [employer]. Would like testing today. No dysuria or urgency or frequency.  Received pamphlet some contaminated drinking water [facility]. Advised may be at increased risk of different types of cancers. Drinking water was contaminated with trichloroethylene, benzene, perchloroethylene and vinylchloride.  Wt Readings from Last 3 Encounters:  03/04/14 303 lb 8 oz (137.667 kg)  10/01/13 305 lb (138.347 kg)  12/02/12 305 lb (138.347 kg)   Body mass index is 40.05 kg/(m^2).  Relevant past medical, surgical, family and social history reviewed and updated as indicated.  Allergies and medications reviewed and updated. Current Outpatient  Prescriptions on File Prior to Visit  Medication Sig  . atorvastatin (LIPITOR) 10 MG tablet TAKE 1 TABLET (10 MG TOTAL) BY MOUTH AT BEDTIME.  Marland Kitchen. glimepiride (AMARYL) 4 MG tablet TAKE 2 TABLETS BY MOUTH EVERY DAY BEFORE BREAKFAST  . glucose blood (ONE TOUCH ULTRA TEST) test strip Check blood sugar twice a day and as instructed. Dx 250.00  . HYDROcodone-acetaminophen (NORCO) 5-325 MG per tablet 1-2 tabs po q6 hours prn pain  . JANUVIA 50 MG tablet TAKE 1 TABLET (50 MG TOTAL) BY MOUTH DAILY.  Marland Kitchen. loratadine (CLARITIN) 10 MG tablet Take 10 mg by mouth as needed for allergies.  . metFORMIN (GLUCOPHAGE) 1000 MG tablet TAKE 1 TABLET (1,000 MG TOTAL) BY MOUTH 2 (TWO) TIMES DAILY WITH A MEAL.  Marland Kitchen. STENDRA 100 MG TABS TAKE 1/2 - 1 TABLET (50-100 MG) BY MOUTH DAILY AS NEEDED.  Marland Kitchen. valACYclovir (VALTREX) 500 MG tablet TAKE 1 TABLET BY MOUTH TWICE A DAY AS DIRECTED  . diphenhydrAMINE (BENADRYL) 25 MG tablet Take 25 mg by mouth every 6 (six) hours as needed for allergies.    No current facility-administered medications on file prior to visit.    Review of Systems Per HPI unless specifically indicated above    Objective:    BP 126/66  Pulse 72  Temp(Src) 98.3 F (36.8 C) (Oral)  Wt 303 lb 8 oz (137.667 kg)  Physical Exam  Nursing note and vitals reviewed. Constitutional: He appears well-developed and well-nourished. No distress.  HENT:  Head: Normocephalic and atraumatic.  Mouth/Throat: Oropharynx is clear and moist. No oropharyngeal exudate.  Eyes:  Conjunctivae and EOM are normal. Pupils are equal, round, and reactive to light. No scleral icterus.  Neck: Normal range of motion. Neck supple.  Cardiovascular: Normal rate, regular rhythm, normal heart sounds and intact distal pulses.   No murmur heard. Pulmonary/Chest: Effort normal and breath sounds normal. No respiratory distress. He has no wheezes. He has no rales.  Abdominal: Hernia confirmed negative in the right inguinal area and confirmed  negative in the left inguinal area.  Genitourinary: Testes normal and penis normal. Right testis shows no mass, no swelling and no tenderness. Right testis is descended. Left testis shows no mass, no swelling and no tenderness. Left testis is descended. No phimosis. No discharge found.  Musculoskeletal: He exhibits no edema.  Diabetic foot exam: Normal inspection No skin breakdown + calluses mainly R sole Normal DP/PT pulses Normal sensation to light touch and decreased monofilament Nails normal  Lymphadenopathy:    He has no cervical adenopathy.       Right: No inguinal adenopathy present.       Left: No inguinal adenopathy present.  Skin: Skin is warm and dry. No rash noted.  Psychiatric: He has a normal mood and affect.   Results for orders placed in visit on 02/16/14  BASIC METABOLIC PANEL      Result Value Ref Range   Sodium 135  135 - 145 mEq/L   Potassium 4.0  3.5 - 5.1 mEq/L   Chloride 101  96 - 112 mEq/L   CO2 26  19 - 32 mEq/L   Glucose, Bld 137 (*) 70 - 99 mg/dL   BUN 12  6 - 23 mg/dL   Creatinine, Ser 0.9  0.4 - 1.5 mg/dL   Calcium 9.2  8.4 - 09.8 mg/dL   GFR 119.14  >78.29 mL/min  PSA      Result Value Ref Range   PSA 1.92  0.10 - 4.00 ng/mL  HEMOGLOBIN A1C      Result Value Ref Range   Hemoglobin A1C 10.9 (*) 4.6 - 6.5 %      Assessment & Plan:   Problem List Items Addressed This Visit   Diabetic neuropathy     Discussed gabapentin - will do trial of this.    Exposure to toxic chemical     Will check LFTs, UA, CBC to eval for increased risk liver cancer, bladder cancer, kidney cancer and NHL/leukemia.    Relevant Orders      Hepatic function panel      CBC with Differential   History of hypertension     Actually bp today stable. Pt self tapered off lisinopril and tolerating well. Consider lwo dose lisinopril in future. Check microalbumin in urine today    HLD (hyperlipidemia)     Continue lipitor 10mg  daily, check FLP today.    Relevant Orders       Lipid panel   Penile rash     Endorses penile rash recently with possible urethral discharge - actually improved. Will check STDs including CT/GC and HIV/RPR today. Exam WNL today.  H/o genital herpes but endorses this is different.    Relevant Orders      HIV antibody      RPR      GC/chlamydia probe amp, urine   Uncontrolled type 2 diabetes with neuropathy - Primary     Persistently uncontrolled due to missed doses of oral antihyperglycemics. Spent significant portion of visit today discussing diabetic control and benefit of regular schedule and meals for optimal sugar  control. Also discussed need to take meds regularly for good control. Discussed reasons to treat high sugars. Pt states he will work on taking meds more regularly. Continue current regimen, rtc 3 mo for DM f/u. rec schedule eye exam as due. Declines pneumovax. Foot exam today.     Other Visit Diagnoses   Screen for STD (sexually transmitted disease)        Relevant Orders       HIV antibody       RPR       GC/chlamydia probe amp, urine    Morbid obesity            Follow up plan: Return in about 3 months (around 06/04/2014), or as needed, for follow up visit.

## 2014-03-04 NOTE — Addendum Note (Signed)
Addended by: Josph MachoANCE, KIMBERLY A on: 03/04/2014 12:38 PM   Modules accepted: Orders

## 2014-03-04 NOTE — Assessment & Plan Note (Signed)
Persistently uncontrolled due to missed doses of oral antihyperglycemics. Spent significant portion of visit today discussing diabetic control and benefit of regular schedule and meals for optimal sugar control. Also discussed need to take meds regularly for good control. Discussed reasons to treat high sugars. Pt states he will work on taking meds more regularly. Continue current regimen, rtc 3 mo for DM f/u. rec schedule eye exam as due. Declines pneumovax. Foot exam today.

## 2014-03-04 NOTE — Patient Instructions (Signed)
Schedule eye exam as you're due. Fill pill box weekly and use (can take to work). Need to take diabetes meds regularly to see positive effect on sugar control. Blood work and urine checked today - we will call you with results. Return to see me in 3 months for follow up, sooner if needed.

## 2014-03-04 NOTE — Assessment & Plan Note (Signed)
Endorses penile rash recently with possible urethral discharge - actually improved. Will check STDs including CT/GC and HIV/RPR today. Exam WNL today.  H/o genital herpes but endorses this is different.

## 2014-03-04 NOTE — Assessment & Plan Note (Signed)
Actually bp today stable. Pt self tapered off lisinopril and tolerating well. Consider lwo dose lisinopril in future. Check microalbumin in urine today

## 2014-03-04 NOTE — Assessment & Plan Note (Signed)
Continue lipitor 10mg  daily, check FLP today.

## 2014-03-04 NOTE — Telephone Encounter (Signed)
Relevant patient education assigned to patient using Emmi. ° °

## 2014-03-04 NOTE — Assessment & Plan Note (Signed)
Will check LFTs, UA, CBC to eval for increased risk liver cancer, bladder cancer, kidney cancer and NHL/leukemia.

## 2014-03-05 ENCOUNTER — Telehealth: Payer: Self-pay

## 2014-03-05 ENCOUNTER — Other Ambulatory Visit: Payer: Self-pay | Admitting: Family Medicine

## 2014-03-05 DIAGNOSIS — E785 Hyperlipidemia, unspecified: Secondary | ICD-10-CM

## 2014-03-05 LAB — GC/CHLAMYDIA PROBE AMP, URINE
Chlamydia, Swab/Urine, PCR: NEGATIVE
GC PROBE AMP, URINE: NEGATIVE

## 2014-03-05 LAB — RPR

## 2014-03-05 LAB — HIV ANTIBODY (ROUTINE TESTING W REFLEX): HIV: NONREACTIVE

## 2014-03-05 NOTE — Telephone Encounter (Signed)
Patient notified and will come in now.

## 2014-03-05 NOTE — Telephone Encounter (Signed)
Blood work was normal regarding this. Can he come in today (no OV needed) for eval of lesion?

## 2014-03-05 NOTE — Telephone Encounter (Signed)
Pt left v/m; pt was seen on 03/04/14; pt having major penile discomfort today discomfort level is consistent at 6. Pt has lesions on penis today.No urethral discharge or burning or frequency of urine and request refill of doxycycline or what would Dr Reece AgarG suggest. [facility]

## 2014-03-26 ENCOUNTER — Other Ambulatory Visit: Payer: Self-pay | Admitting: Family Medicine

## 2014-03-30 ENCOUNTER — Ambulatory Visit (INDEPENDENT_AMBULATORY_CARE_PROVIDER_SITE_OTHER): Payer: 59 | Admitting: Family Medicine

## 2014-03-30 DIAGNOSIS — W57XXXA Bitten or stung by nonvenomous insect and other nonvenomous arthropods, initial encounter: Secondary | ICD-10-CM

## 2014-03-30 NOTE — Progress Notes (Signed)
   Subjective:    Patient ID: Jonathon [name], male    DOB: Jul 27, 1965, 48 y.o.   MRN: [id number]  HPI Pt did not realize this was a workman's comp case -had to go elsewhere for this    Review of Systems     Objective:   Physical Exam        Assessment & Plan:

## 2014-03-30 NOTE — Progress Notes (Deleted)
Pre visit review using our clinic review tool, if applicable. No additional management support is needed unless otherwise documented below in the visit note. 

## 2014-05-01 ENCOUNTER — Other Ambulatory Visit: Payer: Self-pay

## 2014-05-08 ENCOUNTER — Encounter: Payer: Self-pay | Admitting: Family Medicine

## 2014-05-08 ENCOUNTER — Ambulatory Visit (INDEPENDENT_AMBULATORY_CARE_PROVIDER_SITE_OTHER): Payer: 59 | Admitting: Family Medicine

## 2014-05-08 VITALS — BP 140/84 | HR 68 | Temp 97.8°F | Wt 304.2 lb

## 2014-05-08 DIAGNOSIS — M25562 Pain in left knee: Secondary | ICD-10-CM

## 2014-05-08 MED ORDER — TRAMADOL HCL 50 MG PO TABS: 50.0000 mg | ORAL_TABLET | Freq: Three times a day (TID) | ORAL | Status: DC | PRN

## 2014-05-08 NOTE — Progress Notes (Signed)
Pre visit review using our clinic review tool, if applicable. No additional management support is needed unless otherwise documented below in the visit note.  He had worked hard on diet, sugar came down to ~120 and he was able to get off the [place].  He can f/u with PCP later on for repeat A1c.    L knee pain.  More pain recently.  Had been going on chronically.  H/o injury in [employer] [place].  Was marching, stepped in a hole at the time.  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  Pain at baseline, worse with long periods of standing.  Pain at night.  No pain with weather changes.  No R knee pain.  Can feel and occ hear a pop with ROM at L knee, especially after sitting.  Not usually red/puffy/swollen.  Anterior and lateral and medial pain.  No posterior pain.    Meds, vitals, and allergies reviewed.   ROS: See HPI.  Otherwise, noncontributory.  nad L knee with normal inspection, no bruising, not red or puffy Medial and lateral joint line slightly ttp Minimal patellar crepitus on exam.  [facility] L[facility] feel solid, no click on meniscus testing.  Did have sig pain and weakness on ITB testing L leg.

## 2014-05-08 NOTE — Patient Instructions (Signed)
Left ITB weakness. Use straight leg lifts, rotated side leg lifts, and then side leg lifts.  Start with sets of 5, then up to 7, 10, 15.  Ice your knee. Tramadol for pain.  Take care.  Glad to see you.  Notify Dr. Reece AgarG if not better.

## 2014-05-10 DIAGNOSIS — G8929 Other chronic pain: Secondary | ICD-10-CM | POA: Insufficient documentation

## 2014-05-10 DIAGNOSIS — M25562 Pain in left knee: Secondary | ICD-10-CM | POA: Insufficient documentation

## 2014-05-10 NOTE — Assessment & Plan Note (Signed)
Distant injury, overweight, ITB weakness.  All likely contribute.  He is working on his weight.  D/w pt about SLR, rotated SLR, and side leg lifts for ITB weakness.  Ice and tramadol for pain, routine cautions given.  F/u with PCP.  He agrees.

## 2014-06-16 ENCOUNTER — Other Ambulatory Visit: Payer: Self-pay | Admitting: Family Medicine

## 2014-06-19 ENCOUNTER — Ambulatory Visit (INDEPENDENT_AMBULATORY_CARE_PROVIDER_SITE_OTHER): Payer: 59 | Admitting: Family Medicine

## 2014-06-19 ENCOUNTER — Ambulatory Visit (INDEPENDENT_AMBULATORY_CARE_PROVIDER_SITE_OTHER)
Admission: RE | Admit: 2014-06-19 | Discharge: 2014-06-19 | Disposition: A | Discharge status: Home / Self Care | Payer: 59 | Source: Ambulatory Visit | Attending: Family Medicine | Admitting: Family Medicine

## 2014-06-19 DIAGNOSIS — G8929 Other chronic pain: Secondary | ICD-10-CM

## 2014-06-19 DIAGNOSIS — M25569 Pain in unspecified knee: Secondary | ICD-10-CM

## 2014-06-21 ENCOUNTER — Encounter: Payer: Self-pay | Admitting: Family Medicine

## 2014-06-21 NOTE — Progress Notes (Signed)
See scanned note.

## 2014-06-27 ENCOUNTER — Other Ambulatory Visit: Payer: Self-pay | Admitting: Family Medicine

## 2014-07-27 ENCOUNTER — Telehealth: Payer: Self-pay

## 2014-07-27 NOTE — Telephone Encounter (Signed)
PLEASE NOTE: All timestamps contained within this report are represented as [place] Standard Time. CONFIDENTIALTY NOTICE: This fax transmission is intended only for the addressee. It contains information that is legally privileged, confidential or otherwise protected from use or disclosure. If you are not the intended recipient, you are strictly prohibited from reviewing, disclosing, copying using or disseminating any of this information or taking any action in reliance on or regarding this information. If you have received this fax in error, please notify us immediately by telephone so that we can arrange for its return to [place]. Phone: [phone], Toll-Free: [phone], Fax: [phone] Page: 1 of 2 Call Id: [id number] [facility] Night - Client TELEPHONE ADVICE RECORD [facility] Patient Name: Jonathon [name] Gender: Male DOB: 10/06/1965 Age: 49 Y 10 M 15 D Return Phone Number: [phone] (Primary), [phone] (Secondary) Address: [address] City/State/Zip: [name] [address] Client [facility] Night - Client Client Site [facility] - Night Physician [name] Contact Type Call Call Type Triage / Clinical Relationship To Patient Self Return Phone Number [phone] (Primary) Chief Complaint Penis Symptoms Initial Comment Caller states, penis discomfort , patient used a cream in the past which helped PreDisposition Did not know what to do Nurse Assessment Nurse: Roderic Ovens, RN, Amy Date/Time (Eastern Time): 07/25/2014 9:52:12 AM Confirm and document reason for call. If symptomatic, describe symptoms. ---CALLER STATES THAT THIS STARTED ABOUT A MONTH AGO. MD TOLD HIM TO USE LOTRIMIN AND NEOSPORIN. HE STATES THAT NYSTATIN TRIAMZINOLE ACETIPE WAS THE NAME OF THE CREAM THAT HE HAD USED BEFORE. HE STATES A LIGHT RASH, ITCHY. IT WILL GO AWAY AND THEN IT WILL COME BACK. HE STATES IT'S THE  PENIS ITSELF THAT IS AFFECTED. Has the patient traveled out of the country within the last 30 days? ---Not Applicable Does the patient require triage? ---Yes Related visit to physician within the last 2 weeks? ---No Does the PT have any chronic conditions? (i.e. diabetes, asthma, etc.) ---Yes List chronic conditions. ---DIABETIC Guidelines Guideline Title Affirmed Question Affirmed Notes Nurse Date/Time (Eastern Time) Penis and Scrotum Symptoms Patient sounds very sick or weak to the triager RED, IRRITATED, ITCHY, 2 AREAS THAT APPEAR AND GO AWAY [place], RN, Amy 07/25/2014 9:53:56 AM Disp. Time Lamount Cohen Time) Disposition Final User 07/25/2014 10:01:02 AM Go to ED Now (or PCP triage) Yes Roderic Ovens, RN, Amy PLEASE NOTE: All timestamps contained within this report are represented as [place] Standard Time. CONFIDENTIALTY NOTICE: This fax transmission is intended only for the addressee. It contains information that is legally privileged, confidential or otherwise protected from use or disclosure. If you are not the intended recipient, you are strictly prohibited from reviewing, disclosing, copying using or disseminating any of this information or taking any action in reliance on or regarding this information. If you have received this fax in error, please notify us immediately by telephone so that we can arrange for its return to [place]. Phone: [phone], Toll-Free: [phone], Fax: [phone] Page: 2 of 2 Call Id: [id number] Caller Understands: Yes Disagree/Comply: Comply Care Advice Given Per Guideline GO TO ED NOW (OR PCP TRIAGE): CARE ADVICE given per Penis Symptoms (Adult) guideline. After Care Instructions Given Call Event Type User Date / Time Description Comments User: Brent Bulla, RN Date/Time (Eastern Time): 07/25/2014 10:03:48 AM WAS GOING TO CALL [facility] IN FOR THE CALLER - BUT AS THE TRIAGE CALL CONTINUED HE BEGAN SPEAKING OF 2 AREAS THAT WERE ON THE PENIS THAT KEEP  APPEARING AND WILL GO AWAY AND COME BACK. HE DESCRIBED THEM AS "SCRATCHES." TRIAGE NURSE FELT IN BEST INTEREST OF PATIENT TO BE SEEN BY MD. Referrals [facility]

## 2014-07-29 NOTE — Telephone Encounter (Signed)
plz ensure pt f/u over weekend o/w may schedule OV to evaluate.

## 2014-07-29 NOTE — Telephone Encounter (Signed)
Spoke with patient. He did not go for eval. Appt scheduled for tomorrow.

## 2014-07-30 ENCOUNTER — Telehealth: Payer: Self-pay | Admitting: *Deleted

## 2014-07-30 ENCOUNTER — Ambulatory Visit (INDEPENDENT_AMBULATORY_CARE_PROVIDER_SITE_OTHER): Payer: 59 | Admitting: Family Medicine

## 2014-07-30 ENCOUNTER — Encounter: Payer: Self-pay | Admitting: Family Medicine

## 2014-07-30 VITALS — BP 124/84 | HR 68 | Temp 98.1°F | Wt 306.5 lb

## 2014-07-30 DIAGNOSIS — E1165 Type 2 diabetes mellitus with hyperglycemia: Secondary | ICD-10-CM

## 2014-07-30 DIAGNOSIS — M79652 Pain in left thigh: Secondary | ICD-10-CM

## 2014-07-30 DIAGNOSIS — N489 Disorder of penis, unspecified: Secondary | ICD-10-CM

## 2014-07-30 DIAGNOSIS — IMO0002 Reserved for concepts with insufficient information to code with codable children: Secondary | ICD-10-CM

## 2014-07-30 DIAGNOSIS — E785 Hyperlipidemia, unspecified: Secondary | ICD-10-CM

## 2014-07-30 DIAGNOSIS — E1140 Type 2 diabetes mellitus with diabetic neuropathy, unspecified: Secondary | ICD-10-CM

## 2014-07-30 DIAGNOSIS — R21 Rash and other nonspecific skin eruption: Secondary | ICD-10-CM

## 2014-07-30 LAB — LDL CHOLESTEROL, DIRECT: Direct LDL: 75 mg/dL

## 2014-07-30 LAB — HEMOGLOBIN A1C: Hgb A1c MFr Bld: 12.3 % — ABNORMAL HIGH (ref 4.6–6.5)

## 2014-07-30 MED ORDER — NYSTATIN 100000 UNIT/GM EX CREA: 1.0000 "application " | TOPICAL_CREAM | Freq: Two times a day (BID) | CUTANEOUS | Status: DC

## 2014-07-30 MED ORDER — FLUCONAZOLE 150 MG PO TABS: 150.0000 mg | ORAL_TABLET | Freq: Once | ORAL | Status: DC

## 2014-07-30 NOTE — Assessment & Plan Note (Signed)
Check dLDL today. Pt reports compliance with lipitor 10mg  qhs.

## 2014-07-30 NOTE — Assessment & Plan Note (Signed)
Overdue for f/u. Reviewed recall cbg's. Seems like much better control.c Check A1c today.

## 2014-07-30 NOTE — Progress Notes (Signed)
BP 124/84 mmHg  Pulse 68  Temp(Src) 98.1 F (36.7 C) (Oral)  Wt 306 lb 8 oz (139.027 kg)   CC: check spot on penis.  Subjective:    Patient ID: Jonathon [name], male    DOB: Apr 02, 1966, 49 y.o.   MRN: [id number]  HPI: Jonathon [name] is a 49 y.o. male presenting on 07/30/2014 for Skin lesion   Recurrent penile lesion previously s/p treatment with neosporin then lotrimin. Rash seems to improve then recurs. Rash flares every time he has intercourse.  States he's used nystatin/TCI cream in past which helped.    Noticing some discomfort in medial inner thigh over last week. Wonders if vein trouble there. Denies inguinal hernia/bulging. Denies inciting trauma/injury  Lab Results  Component Value Date   HGBA1C 10.9* 02/16/2014  endorses sugars running 160 fasting. Endorses compliance with metformin 1000mg  bid, amaryl 8mg  every night with dinner after work, and lantus 15u nightly. Due for labs. Noticing improvements - less paresthesias, less foot pain, less polyuria.   Relevant past medical, surgical, family and social history reviewed and updated as indicated. Interim medical history since our last visit reviewed. Allergies and medications reviewed and updated. Current Outpatient Prescriptions on File Prior to Visit  Medication Sig  . atorvastatin (LIPITOR) 10 MG tablet TAKE 1 TABLET (10 MG TOTAL) BY MOUTH AT BEDTIME.  Marland Kitchen. glimepiride (AMARYL) 4 MG tablet TAKE 2 TABLETS BY MOUTH EVERY DAY BEFORE BREAKFAST  . glucose blood (ONE TOUCH ULTRA TEST) test strip Check blood sugar twice a day and as instructed. Dx 250.00  . insulin glargine (LANTUS) 100 UNIT/ML injection Inject 15 Units into the skin at bedtime.  . metFORMIN (GLUCOPHAGE) 1000 MG tablet TAKE 1 TABLET BY MOUTH TWICE A DAY WITH A MEAL  . STENDRA 100 MG TABS TAKE 1/2 - 1 TABLET (50-100 MG) BY MOUTH DAILY AS NEEDED.  Marland Kitchen. valACYclovir (VALTREX) 500 MG tablet TAKE 1 TABLET BY MOUTH TWICE A DAY AS DIRECTED   No current facility-administered  medications on file prior to visit.    Review of Systems Per HPI unless specifically indicated above     Objective:    BP 124/84 mmHg  Pulse 68  Temp(Src) 98.1 F (36.7 C) (Oral)  Wt 306 lb 8 oz (139.027 kg)  Wt Readings from Last 3 Encounters:  07/30/14 306 lb 8 oz (139.027 kg)  05/08/14 304 lb 4 oz (138.007 kg)  03/04/14 303 lb 8 oz (137.667 kg)    Physical Exam  Constitutional: He is oriented to person, place, and time. He appears well-developed and well-nourished. No distress.  Abdominal: Hernia confirmed negative in the right inguinal area and confirmed negative in the left inguinal area.  Genitourinary: Testes normal and penis normal. Right testis shows no mass, no swelling and no tenderness. Right testis is descended. Left testis shows no mass, no swelling and no tenderness. Left testis is descended. Uncircumcised. No phimosis, hypospadias, penile erythema or penile tenderness. No discharge found.  No obvious evident penile rash or irritation on foreskin or head of penis.  Musculoskeletal: He exhibits no edema.  5/5 strength BLE Mild discomfort with abduction at hips and tender to palpation at left medial inner thigh but without erythema or induration.  Lymphadenopathy:       Right: No inguinal adenopathy present.       Left: No inguinal adenopathy present.  Neurological: He is alert and oriented to person, place, and time.  Skin: Skin is warm and dry. No rash noted.  Psychiatric: He  has a normal mood and affect.  Nursing note and vitals reviewed.      Assessment & Plan:   Problem List Items Addressed This Visit    Uncontrolled type 2 diabetes with neuropathy    Overdue for f/u. Reviewed recall cbg's. Seems like much better control.c Check A1c today.      Relevant Orders   Hemoglobin A1c   LDL Cholesterol, Direct   Penile rash - Primary    Consistent with fungal balanitis. Today no significant inflammation/infection. Recommended against topical steroid. Will  prescribe oral diflucan x1 and then discussed hygiene and preventative lotrimin or nystatin use. Prescribed nystatin cream today as well. Discussed also importance of good glycemic control to prevent fungal infections. Update if not improved. Pt agrees with plan.      Left thigh pain    Anticipate groin strain - treat with stretching exercises provided today. Update if not improving as expected.      HLD (hyperlipidemia)    Check dLDL today. Pt reports compliance with lipitor  qhs.      Relevant Orders   LDL Cholesterol, Direct       Follow up plan: Return if symptoms worsen or fail to improve.

## 2014-07-30 NOTE — Assessment & Plan Note (Signed)
Anticipate groin strain - treat with stretching exercises provided today. Update if not improving as expected.

## 2014-07-30 NOTE — Progress Notes (Signed)
Pre visit review using our clinic review tool, if applicable. No additional management support is needed unless otherwise documented below in the visit note. 

## 2014-07-30 NOTE — Assessment & Plan Note (Signed)
Consistent with fungal balanitis. Today no significant inflammation/infection. Recommended against topical steroid. Will prescribe oral diflucan x1 and then discussed hygiene and preventative lotrimin or nystatin use. Prescribed nystatin cream today as well. Discussed also importance of good glycemic control to prevent fungal infections. Update if not improved. Pt agrees with plan.

## 2014-07-30 NOTE — Patient Instructions (Addendum)
Diflucan x 1 by mouth. Nystatin sent to pharmacy to try. Use either nystatin or lotrimin twice weekly preventatively. Blood work today. For leg pain - I think we have groin strain - try exercises provided today. Good to see you today, call us with questions.

## 2014-07-30 NOTE — Telephone Encounter (Signed)
[facility] called with Level 1 interaction between diflucan and stendra (hypotension,priapism,blurry vision). Dr. Reece AgarG notified and advised to have patient patient hold stendra x4 days. [facility] notified and will notify patient.

## 2014-08-22 ENCOUNTER — Other Ambulatory Visit: Payer: Self-pay | Admitting: Family Medicine

## 2014-08-24 NOTE — Telephone Encounter (Signed)
Ok to refill? Not on current or previous med list. 

## 2014-09-19 ENCOUNTER — Other Ambulatory Visit: Payer: Self-pay | Admitting: Family Medicine

## 2014-10-03 ENCOUNTER — Other Ambulatory Visit: Payer: Self-pay | Admitting: Family Medicine

## 2014-10-17 ENCOUNTER — Other Ambulatory Visit: Payer: Self-pay | Admitting: Family Medicine

## 2015-01-06 ENCOUNTER — Other Ambulatory Visit: Payer: Self-pay

## 2015-01-06 ENCOUNTER — Telehealth: Payer: Self-pay

## 2015-01-06 DIAGNOSIS — IMO0002 Reserved for concepts with insufficient information to code with codable children: Secondary | ICD-10-CM

## 2015-01-06 DIAGNOSIS — E1165 Type 2 diabetes mellitus with hyperglycemia: Principal | ICD-10-CM

## 2015-01-06 DIAGNOSIS — E1140 Type 2 diabetes mellitus with diabetic neuropathy, unspecified: Secondary | ICD-10-CM

## 2015-01-06 NOTE — Telephone Encounter (Signed)
Error

## 2015-01-12 ENCOUNTER — Other Ambulatory Visit: Payer: 59

## 2015-03-04 ENCOUNTER — Telehealth: Payer: Self-pay | Admitting: Family Medicine

## 2015-03-04 NOTE — Telephone Encounter (Signed)
Pt aware.

## 2015-03-04 NOTE — Telephone Encounter (Signed)
Left message asking pt to call office  Becky @ [facility] called wanted to verify pt phone number   i didn't give pt phone number out Please let pt know that [facility] @ [facility] would like him to call her [phone] ext [phone]

## 2015-03-04 NOTE — Telephone Encounter (Signed)
Pt left v/m returning call and request cb at [phone].

## 2015-05-20 ENCOUNTER — Other Ambulatory Visit: Payer: Self-pay

## 2015-05-20 NOTE — Telephone Encounter (Signed)
Pt left v/m; pt has an outbreak and request refill valtrex; pt is between jobs and [facility]. Pt request cb. Pt last seen 07/30/14; and rx last refilled # [phone] on 06/27/14.Please advise.

## 2015-05-21 MED ORDER — VALACYCLOVIR HCL 500 MG PO TABS: ORAL_TABLET | ORAL | Status: DC

## 2015-05-21 NOTE — Telephone Encounter (Signed)
plz notify sent in. 

## 2015-08-08 ENCOUNTER — Emergency Department (HOSPITAL_COMMUNITY)
Admission: EM | Admit: 2015-08-08 | Discharge: 2015-08-09 | Disposition: A | Discharge status: Home / Self Care | Payer: Non-veteran care | Attending: Emergency Medicine | Admitting: Emergency Medicine

## 2015-08-08 ENCOUNTER — Encounter (HOSPITAL_COMMUNITY): Payer: Self-pay | Admitting: *Deleted

## 2015-08-08 DIAGNOSIS — I10 Essential (primary) hypertension: Secondary | ICD-10-CM | POA: Diagnosis not present

## 2015-08-08 DIAGNOSIS — E119 Type 2 diabetes mellitus without complications: Secondary | ICD-10-CM | POA: Insufficient documentation

## 2015-08-08 DIAGNOSIS — R109 Unspecified abdominal pain: Secondary | ICD-10-CM | POA: Diagnosis present

## 2015-08-08 DIAGNOSIS — Z8619 Personal history of other infectious and parasitic diseases: Secondary | ICD-10-CM | POA: Diagnosis not present

## 2015-08-08 DIAGNOSIS — E785 Hyperlipidemia, unspecified: Secondary | ICD-10-CM | POA: Insufficient documentation

## 2015-08-08 DIAGNOSIS — Z794 Long term (current) use of insulin: Secondary | ICD-10-CM | POA: Insufficient documentation

## 2015-08-08 DIAGNOSIS — Z87438 Personal history of other diseases of male genital organs: Secondary | ICD-10-CM | POA: Insufficient documentation

## 2015-08-08 DIAGNOSIS — Z7984 Long term (current) use of oral hypoglycemic drugs: Secondary | ICD-10-CM | POA: Diagnosis not present

## 2015-08-08 DIAGNOSIS — Z79899 Other long term (current) drug therapy: Secondary | ICD-10-CM | POA: Insufficient documentation

## 2015-08-08 LAB — LIPASE, BLOOD: Lipase: 26 U/L (ref 11–51)

## 2015-08-08 LAB — COMPREHENSIVE METABOLIC PANEL
ALBUMIN: 3.9 g/dL (ref 3.5–5.0)
ALT: 45 U/L (ref 17–63)
ANION GAP: 12 (ref 5–15)
AST: 26 U/L (ref 15–41)
Alkaline Phosphatase: 49 U/L (ref 38–126)
BILIRUBIN TOTAL: 0.7 mg/dL (ref 0.3–1.2)
BUN: 14 mg/dL (ref 6–20)
CO2: 23 mmol/L (ref 22–32)
Calcium: 9.4 mg/dL (ref 8.9–10.3)
Chloride: 104 mmol/L (ref 101–111)
Creatinine, Ser: 1.15 mg/dL (ref 0.61–1.24)
GFR calc non Af Amer: >60 mL/min (ref >60)
GLUCOSE: 276 mg/dL — AB (ref 65–99)
Potassium: 4 mmol/L (ref 3.5–5.1)
SODIUM: 139 mmol/L (ref 135–145)
Total Protein: 6.9 g/dL (ref 6.5–8.1)

## 2015-08-08 LAB — CBC
HCT: 43.9 % (ref 39.0–52.0)
HEMOGLOBIN: 15.5 g/dL (ref 13.0–17.0)
MCH: 31.3 pg (ref 26.0–34.0)
MCHC: 35.3 g/dL (ref 30.0–36.0)
MCV: 88.7 fL (ref 78.0–100.0)
Platelets: 238 10*3/uL (ref 150–400)
RBC: 4.95 MIL/uL (ref 4.22–5.81)
RDW: 13.4 % (ref 11.5–15.5)
WBC: 6.9 10*3/uL (ref 4.0–10.5)

## 2015-08-08 MED ORDER — OXYCODONE-ACETAMINOPHEN 5-325 MG PO TABS
1.0000 | ORAL_TABLET | Freq: Once | ORAL | Status: AC
  Administered 2015-08-08: 1 via ORAL
  Filled 2015-08-08: qty 1

## 2015-08-08 NOTE — ED Notes (Signed)
The pt is c/.o rt flank pain for 2 weeks no n  V or diarrhea.  No bloody urine.   No coough no cold

## 2015-08-09 ENCOUNTER — Emergency Department (HOSPITAL_COMMUNITY): Payer: Non-veteran care

## 2015-08-09 LAB — URINALYSIS, ROUTINE W REFLEX MICROSCOPIC
Bilirubin Urine: NEGATIVE
Hgb urine dipstick: NEGATIVE
Ketones, ur: NEGATIVE mg/dL
LEUKOCYTES UA: NEGATIVE
Nitrite: NEGATIVE
PH: 5 (ref 5.0–8.0)
Protein, ur: NEGATIVE mg/dL
SPECIFIC GRAVITY, URINE: 1.036 — AB (ref 1.005–1.030)

## 2015-08-09 LAB — URINE MICROSCOPIC-ADD ON: RBC / HPF: NONE SEEN RBC/hpf (ref 0–5)

## 2015-08-09 MED ORDER — OXYCODONE-ACETAMINOPHEN 5-325 MG PO TABS: 1.0000 | ORAL_TABLET | ORAL | Status: DC | PRN

## 2015-08-09 MED ORDER — METHOCARBAMOL 500 MG PO TABS: 500.0000 mg | ORAL_TABLET | Freq: Two times a day (BID) | ORAL | Status: DC

## 2015-08-09 NOTE — ED Provider Notes (Signed)
CSN: [id number]     Arrival date & time 08/08/15  1939 History   First MD Initiated Contact with Patient 08/08/15 2252     Chief Complaint  Patient presents with  . Flank Pain     (Consider location/radiation/quality/duration/timing/severity/associated sxs/prior Treatment) Patient is a 50 y.o. male presenting with flank pain. The history is provided by the patient. No language interpreter was used.  Flank Pain This is a new problem. Associated symptoms include abdominal pain. Pertinent negatives include no chest pain, chills, fever, myalgias, nausea, rash or vomiting. Associated symptoms comments: Patient presents with progressively worsening, sharp pain in the right side abdomen that started 2 weeks ago. No nausea, vomiting or diarrhea. No fever. He reports periodically breaking out into sweats for no reason but reports it is usually when the pain is the worst. He has a history of DM and reports medication compliance and well controlled blood sugars. No history of kidney stones. No dysuria or hematuria. The pain is consistently in the same location with radiation into groin. No rash. No injury to the area..    Past Medical History  Diagnosis Date  . T2DM (type 2 diabetes mellitus) (HCC) 2010    lantus started by [place]  . HLD (hyperlipidemia)   . Morbid obesity (HCC)   . Family history of prostate cancer     father, brother  . History of chronic prostatitis 2012    granulomatous, CXR done - negative  . Elevated PSA 2012    with L lobe induration on DRE, normal biopsy 2012 ([place])  . Genital herpes   . ED (erectile dysfunction) of organic origin   . Seasonal allergies   . History of hypertension    Past Surgical History  Procedure Laterality Date  . Prostate biopsy  2012    granulomatous prostatitis  . Ett  2012    WNL ([facility])  . I&d extremity Right 12/03/2012    Procedure: IRRIGATION AND DEBRIDEMENT RIGHT HAND ABSCESS ;  Surgeon: Tami Ribas, MD;  Location: [facility];  Service: Orthopedics;  Laterality: Right;   Family History  Problem Relation Age of Onset  . Cancer Father 46    colon, prostate  . Hypertension Father   . Cancer Brother 67    prostate  . Diabetes Mother   . Diabetes Father   . Diabetes Sister   . Stroke Father 22  . Coronary artery disease Father 69    MI   Social History  Substance Use Topics  . Smoking status: Never Smoker   . Smokeless tobacco: Never Used  . Alcohol Use: Yes     Comment: occasional    Review of Systems  Constitutional: Negative for fever and chills.  HENT: Negative.   Respiratory: Negative.  Negative for shortness of breath.   Cardiovascular: Negative.  Negative for chest pain.  Gastrointestinal: Positive for abdominal pain. Negative for nausea, vomiting and diarrhea.  Genitourinary: Positive for flank pain. Negative for dysuria, hematuria, decreased urine volume, scrotal swelling, difficulty urinating and testicular pain.  Musculoskeletal: Negative.  Negative for myalgias and back pain.  Skin: Negative.  Negative for color change and rash.  Neurological: Negative.       Allergies  Review of patient's allergies indicates no known allergies.  Home Medications   Prior to Admission medications   Medication Sig Start Date End Date Taking? Authorizing Provider  atorvastatin (LIPITOR) 10 MG tablet TAKE 1 TABLET (10 MG TOTAL) BY MOUTH AT BEDTIME. Patient taking  differently: TAKE 1 TABLET (10 MG TOTAL) BY MOUTH IN THE MORNING 03/04/13  Yes [name], MD  fenofibrate (TRICOR) 145 MG tablet Take 145 mg by mouth daily.   Yes Historical Provider, MD  gabapentin (NEURONTIN) 300 MG capsule Take 300 mg by mouth at bedtime.   Yes Historical Provider, MD  glucose blood (ONE TOUCH ULTRA TEST) test strip Check blood sugar twice a day and as instructed. Dx 250.00 07/14/13  Yes [name], MD  insulin lispro (HUMALOG) 100 UNIT/ML injection Inject 35 Units into the skin at bedtime.   Yes  Historical Provider, MD  lisinopril (PRINIVIL,ZESTRIL) 20 MG tablet Take 20 mg by mouth daily.   Yes Historical Provider, MD  metFORMIN (GLUCOPHAGE) 1000 MG tablet TAKE 1 TABLET BY MOUTH TWICE A DAY WITH A MEAL Patient taking differently: TAKE 0.5 TABLET BY MOUTH TWICE A DAY WITH A MEAL 03/26/14  Yes [name], MD  methocarbamol (ROBAXIN) 500 MG tablet Take 1 tablet (500 mg total) by mouth 2 (two) times daily. 08/09/15   Elpidio Anis, PA-C  oxyCODONE-acetaminophen (PERCOCET/ROXICET) 5-325 MG tablet Take 1 tablet by mouth every 4 (four) hours as needed for severe pain. 08/09/15   Mozelle Remlinger, PA-C   BP 118/87 mmHg  Pulse 79  Temp(Src) 98.6 F (37 C) (Oral)  Resp 16  Wt 137.525 kg  SpO2 96% Physical Exam  Constitutional: He appears well-developed and well-nourished.  HENT:  Head: Normocephalic.  Neck: Normal range of motion. Neck supple.  Cardiovascular: Normal rate and regular rhythm.   Pulmonary/Chest: Effort normal and breath sounds normal.  Abdominal: Soft. Bowel sounds are normal. There is tenderness. There is no rebound and no guarding.    Musculoskeletal: Normal range of motion.  Neurological: He is alert. No cranial nerve deficit.  Skin: Skin is warm and dry. No rash noted.  Psychiatric: He has a normal mood and affect.    ED Course  Procedures (including critical care time) Labs Review Labs Reviewed  COMPREHENSIVE METABOLIC PANEL - Abnormal; Notable for the following:    Glucose, Bld 276 (*)    All other components within normal limits  URINALYSIS, ROUTINE W REFLEX MICROSCOPIC (NOT AT [facility]) - Abnormal; Notable for the following:    Specific Gravity, Urine 1.036 (*)    Glucose, UA >1000 (*)    All other components within normal limits  URINE MICROSCOPIC-ADD ON - Abnormal; Notable for the following:    Squamous Epithelial / LPF 0-5 (*)    Bacteria, UA RARE (*)    All other components within normal limits  LIPASE, BLOOD  CBC   Results for orders placed or  performed during the hospital encounter of 08/08/15  Lipase, blood  Result Value Ref Range   Lipase 26 11 - 51 U/L  Comprehensive metabolic panel  Result Value Ref Range   Sodium 139 135 - 145 mmol/L   Potassium 4.0 3.5 - 5.1 mmol/L   Chloride 104 101 - 111 mmol/L   CO2 23 22 - 32 mmol/L   Glucose, Bld 276 (H) 65 - 99 mg/dL   BUN 14 6 - 20 mg/dL   Creatinine, Ser 1.47 0.61 - 1.24 mg/dL   Calcium 9.4 8.9 - 82.9 mg/dL   Total Protein 6.9 6.5 - 8.1 g/dL   Albumin 3.9 3.5 - 5.0 g/dL   AST 26 15 - 41 U/L   ALT 45 17 - 63 U/L   Alkaline Phosphatase 49 38 - 126 U/L   Total Bilirubin 0.7 0.3 -  1.2 mg/dL   GFR calc non Af Amer >60 >60 mL/min   GFR calc Af Amer >60 >60 mL/min   Anion gap 12 5 - 15  CBC  Result Value Ref Range   WBC 6.9 4.0 - 10.5 K/uL   RBC 4.95 4.22 - 5.81 MIL/uL   Hemoglobin 15.5 13.0 - 17.0 g/dL   HCT 16.1 09.6 - 04.5 %   MCV 88.7 78.0 - 100.0 fL   MCH 31.3 26.0 - 34.0 pg   MCHC 35.3 30.0 - 36.0 g/dL   RDW 40.9 81.1 - 91.4 %   Platelets 238 150 - 400 K/uL  Urinalysis, Routine w reflex microscopic (not at [facility])  Result Value Ref Range   Color, Urine YELLOW YELLOW   APPearance CLEAR CLEAR   Specific Gravity, Urine 1.036 (H) 1.005 - 1.030   pH 5.0 5.0 - 8.0   Glucose, UA >1000 (A) NEGATIVE mg/dL   Hgb urine dipstick NEGATIVE NEGATIVE   Bilirubin Urine NEGATIVE NEGATIVE   Ketones, ur NEGATIVE NEGATIVE mg/dL   Protein, ur NEGATIVE NEGATIVE mg/dL   Nitrite NEGATIVE NEGATIVE   Leukocytes, UA NEGATIVE NEGATIVE  Urine microscopic-add on  Result Value Ref Range   Squamous Epithelial / LPF 0-5 (A) NONE SEEN   WBC, UA 0-5 0 - 5 WBC/hpf   RBC / HPF NONE SEEN 0 - 5 RBC/hpf   Bacteria, UA RARE (A) NONE SEEN    Imaging Review Ct Renal Stone Study  08/09/2015  CLINICAL DATA:  Right flank pain for 2 weeks. EXAM: CT ABDOMEN AND PELVIS WITHOUT CONTRAST TECHNIQUE: Multidetector CT imaging of the abdomen and pelvis was performed following the standard protocol without IV  contrast. COMPARISON:  None. FINDINGS: Respiratory motion artifact in the lung bases. Examination is technically limited due to motion artifact. Kidneys are symmetrical in size and shape. No hydronephrosis or hydroureter. No renal, ureteral, or bladder stones. The Unenhanced appearance of the liver, spleen, gallbladder, pancreas, adrenal glands, abdominal aorta, inferior vena cava, and retroperitoneal lymph nodes is unremarkable. Small accessory spleen. Stomach, small bowel, and colon are decompressed. No free air or free fluid in the abdomen. Small amount of fat in the umbilicus. Pelvis: The appendix is normal. Stool-filled rectosigmoid colon. No inflammatory changes. Prostate gland is not enlarged. No free or loculated pelvic fluid collections. No pelvic mass or lymphadenopathy. No destructive bone lesions. IMPRESSION: No renal or ureteral stone or obstruction demonstrated. Electronically Signed   By: Burman Nieves M.D.   On: 08/09/2015 01:46   I have personally reviewed and evaluated these images and lab results as part of my medical decision-making.   EKG Interpretation None      MDM   Final diagnoses:  Side pain    The patient presents for evaluation of pain that started 2 weeks earlier. No fever. Intermittent sweats, no CP, SOB, urinary symptoms, nausea. Labs are essentially normal - hyperglycemia without acidosis (hasn't take night time dose of medications yes). CT done to evaluate for stones - negative. Pain controlled with percocet.  Feel he is safe for discharge home and is encouraged to follow up with his doctor this week for recheck and any further evaluation required. Return precautions discussed. Patient and wife comfortable with discharge home.     Elpidio Anis, PA-C 08/09/15 2044  Lyndal Pulley, MD 08/11/15 [phone]

## 2015-08-09 NOTE — Discharge Instructions (Signed)
Muscle Pain, Adult  Muscle pain (myalgia) may be caused by many things, including:  · Overuse or muscle strain, especially if you are not in shape. This is the most common cause of muscle pain.  · Injury.  · Bruises.  · Viruses, such as the flu.  · Infectious diseases.  · Fibromyalgia, which is a chronic condition that causes muscle tenderness, fatigue, and headache.  · Autoimmune diseases, including lupus.  · Certain drugs, including ACE inhibitors and statins.  Muscle pain may be mild or severe. In most cases, the pain lasts only a short time and goes away without treatment. To diagnose the cause of your muscle pain, your health care provider will take your medical history. This means he or she will ask you when your muscle pain began and what has been happening. If you have not had muscle pain for very long, your health care provider may want to wait before doing much testing. If your muscle pain has lasted a long time, your health care provider may want to run tests right away. If your health care provider thinks your muscle pain may be caused by illness, you may need to have additional tests to rule out certain conditions.   Treatment for muscle pain depends on the cause. Home care is often enough to relieve muscle pain. Your health care provider may also prescribe anti-inflammatory medicine.  HOME CARE INSTRUCTIONS  Watch your condition for any changes. The following actions may help to lessen any discomfort you are feeling:  · Only take over-the-counter or prescription medicines as directed by your health care provider.  · Apply ice to the sore muscle:    Put ice in a plastic bag.    Place a towel between your skin and the bag.    Leave the ice on for 15-20 minutes, 3-4 times a day.  · You may alternate applying hot and cold packs to the muscle as directed by your health care provider.  · If overuse is causing your muscle pain, slow down your activities until the pain goes away.    Remember that it is normal  to feel some muscle pain after starting a workout program. Muscles that have not been used often will be sore at first.    Do regular, gentle exercises if you are not usually active.    Warm up before exercising to lower your risk of muscle pain.  · Do not continue working out if the pain is very bad. Bad pain could mean you have injured a muscle.  SEEK MEDICAL CARE IF:  · Your muscle pain gets worse, and medicines do not help.  · You have muscle pain that lasts longer than 3 days.  · You have a rash or fever along with muscle pain.  · You have muscle pain after a tick bite.  · You have muscle pain while working out, even though you are in good physical condition.  · You have redness, soreness, or swelling along with muscle pain.  · You have muscle pain after starting a new medicine or changing the dose of a medicine.  SEEK IMMEDIATE MEDICAL CARE IF:  · You have trouble breathing.  · You have trouble swallowing.  · You have muscle pain along with a stiff neck, fever, and vomiting.  · You have severe muscle weakness or cannot move part of your body.  MAKE SURE YOU:   · Understand these instructions.  · Will watch your condition.  · Will get   help right away if you are not doing well or get worse.     This information is not intended to replace advice given to you by your health care provider. Make sure you discuss any questions you have with your health care provider.     Document Released: 05/25/2006 Document Revised: 07/24/2014 Document Reviewed: 04/29/2013  Elsevier Interactive Patient Education ©2016 [employer]

## 2015-09-24 ENCOUNTER — Other Ambulatory Visit: Payer: Self-pay | Admitting: Family Medicine

## 2015-12-25 ENCOUNTER — Emergency Department
Admission: EM | Admit: 2015-12-25 | Discharge: 2015-12-25 | Disposition: A | Discharge status: Home / Self Care | Payer: BLUE CROSS/BLUE SHIELD | Attending: Emergency Medicine | Admitting: Emergency Medicine

## 2015-12-25 DIAGNOSIS — Z79899 Other long term (current) drug therapy: Secondary | ICD-10-CM | POA: Insufficient documentation

## 2015-12-25 DIAGNOSIS — I10 Essential (primary) hypertension: Secondary | ICD-10-CM | POA: Diagnosis not present

## 2015-12-25 DIAGNOSIS — E1140 Type 2 diabetes mellitus with diabetic neuropathy, unspecified: Secondary | ICD-10-CM | POA: Insufficient documentation

## 2015-12-25 DIAGNOSIS — E785 Hyperlipidemia, unspecified: Secondary | ICD-10-CM | POA: Diagnosis not present

## 2015-12-25 DIAGNOSIS — L02511 Cutaneous abscess of right hand: Secondary | ICD-10-CM

## 2015-12-25 DIAGNOSIS — L03011 Cellulitis of right finger: Secondary | ICD-10-CM | POA: Diagnosis not present

## 2015-12-25 DIAGNOSIS — Z7984 Long term (current) use of oral hypoglycemic drugs: Secondary | ICD-10-CM | POA: Diagnosis not present

## 2015-12-25 DIAGNOSIS — Z794 Long term (current) use of insulin: Secondary | ICD-10-CM | POA: Diagnosis not present

## 2015-12-25 DIAGNOSIS — L539 Erythematous condition, unspecified: Secondary | ICD-10-CM | POA: Diagnosis present

## 2015-12-25 MED ORDER — IBUPROFEN 600 MG PO TABS
600.0000 mg | ORAL_TABLET | Freq: Once | ORAL | Status: AC
  Administered 2015-12-25: 600 mg via ORAL
  Filled 2015-12-25: qty 1

## 2015-12-25 MED ORDER — TRAMADOL HCL 50 MG PO TABS
50.0000 mg | ORAL_TABLET | Freq: Once | ORAL | Status: AC
  Administered 2015-12-25: 50 mg via ORAL
  Filled 2015-12-25: qty 1

## 2015-12-25 MED ORDER — SULFAMETHOXAZOLE-TRIMETHOPRIM 800-160 MG PO TABS
1.0000 | ORAL_TABLET | Freq: Once | ORAL | Status: AC
  Administered 2015-12-25: 1 via ORAL
  Filled 2015-12-25: qty 1

## 2015-12-25 MED ORDER — SULFAMETHOXAZOLE-TRIMETHOPRIM 800-160 MG PO TABS: 1.0000 | ORAL_TABLET | Freq: Two times a day (BID) | ORAL | Status: DC

## 2015-12-25 MED ORDER — TRAMADOL HCL 50 MG PO TABS
50.0000 mg | ORAL_TABLET | Freq: Four times a day (QID) | ORAL | Status: DC | PRN
Start: 2015-12-25 — End: 2016-08-08

## 2015-12-25 MED ORDER — IBUPROFEN 600 MG PO TABS: 600.0000 mg | ORAL_TABLET | Freq: Three times a day (TID) | ORAL | Status: DC | PRN

## 2015-12-25 NOTE — ED Notes (Signed)
Pt reports to ED w/ c/o finger pain in R middle finger.  Slight swelling and redness noticed. NAD.

## 2015-12-25 NOTE — Discharge Instructions (Signed)
Take medication as directed. Wear splint for 2-3 days. Advised Epsom salt soak 5 minutes twice a day

## 2015-12-25 NOTE — ED Provider Notes (Signed)
[facility] Emergency Department Provider Note   ____________________________________________  Time seen: Approximately 9:07 PM  I have reviewed the triage vital signs and the nursing notes.   HISTORY  Chief Complaint Hand Pain    HPI Jonathon [name] is a 50 y.o. male patient complain of pain and edema to the third digit right hand 2 days. Patient state  bit by insect. Patient rates his pain as a 4/10. No palliative measures taken for this complaint.Patient denies any loss of sensation or loss of function of the finger. He is right-hand dominant.   Past Medical History  Diagnosis Date  . T2DM (type 2 diabetes mellitus) (HCC) 2010    [place]  . HLD (hyperlipidemia)   . Morbid obesity (HCC)   . Family history of prostate cancer     father, brother  . History of chronic prostatitis 2012    granulomatous, CXR done - negative  . Elevated PSA 2012    with L lobe induration on DRE, normal biopsy 2012 (Ottelin)  . Genital herpes   . ED (erectile dysfunction) of organic origin   . Seasonal allergies   . History of hypertension     Patient Active Problem List   Diagnosis Date Noted  . Left thigh pain 07/30/2014  . Knee pain, left 05/10/2014  . Insect bite 03/30/2014  . Exposure to toxic chemical 03/04/2014  . Diabetic neuropathy (HCC) 03/04/2014  . Genital herpes   . Penile rash 08/20/2012  . Uncontrolled type 2 diabetes with neuropathy (HCC)   . History of hypertension   . HLD (hyperlipidemia)   . Morbid obesity (HCC)   . ED (erectile dysfunction) of organic origin   . Family history of prostate cancer     Past Surgical History  Procedure Laterality Date  . Prostate biopsy  2012    granulomatous prostatitis  . Ett  2012    WNL (Hank Katrinka BlazingSmith)  . I&d extremity Right 12/03/2012    Procedure: IRRIGATION AND DEBRIDEMENT RIGHT HAND ABSCESS ;  Surgeon: Tami RibasKevin R Kuzma, MD;  Location: [facility];  Service: Orthopedics;   Laterality: Right;    Current Outpatient Rx  Name  Route  Sig  Dispense  Refill  . atorvastatin (LIPITOR) 10 MG tablet      TAKE 1 TABLET (10 MG TOTAL) BY MOUTH AT BEDTIME. Patient taking differently: TAKE 1 TABLET (10 MG TOTAL) BY MOUTH IN THE MORNING   30 tablet   5   . fenofibrate (TRICOR) 145 MG tablet   Oral   Take 145 mg by mouth daily.         Marland Kitchen. gabapentin (NEURONTIN) 300 MG capsule   Oral   Take 300 mg by mouth at bedtime.         Marland Kitchen. glucose blood (ONE TOUCH ULTRA TEST) test strip      Check blood sugar twice a day and as instructed. Dx 250.00   100 each   3   . ibuprofen (ADVIL,MOTRIN) 600 MG tablet   Oral   Take 1 tablet (600 mg total) by mouth every 8 (eight) hours as needed.   15 tablet   0   . insulin lispro (HUMALOG) 100 UNIT/ML injection   Subcutaneous   Inject 35 Units into the skin at bedtime.         Marland Kitchen. lisinopril (PRINIVIL,ZESTRIL) 20 MG tablet   Oral   Take 20 mg by mouth daily.         .Marland Kitchen  metFORMIN (GLUCOPHAGE) 1000 MG tablet      TAKE 1 TABLET BY MOUTH TWICE A DAY WITH A MEAL Patient taking differently: TAKE 0.5 TABLET BY MOUTH TWICE A DAY WITH A MEAL   60 tablet   3   . methocarbamol (ROBAXIN) 500 MG tablet   Oral   Take 1 tablet (500 mg total) by mouth 2 (two) times daily.   20 tablet   0   . nystatin cream (MYCOSTATIN)   Topical   Apply topically 2 (two) times daily. Will need physical for further refills   30 g   0   . oxyCODONE-acetaminophen (PERCOCET/ROXICET) 5-325 MG tablet   Oral   Take 1 tablet by mouth every 4 (four) hours as needed for severe pain.   10 tablet   0   . sulfamethoxazole-trimethoprim (BACTRIM DS,SEPTRA DS) 800-160 MG tablet   Oral   Take 1 tablet by mouth 2 (two) times daily.   20 tablet   0   . traMADol (ULTRAM) 50 MG tablet   Oral   Take 1 tablet (50 mg total) by mouth every 6 (six) hours as needed.   20 tablet   0     Allergies Review of patient's allergies indicates no known  allergies.  Family History  Problem Relation Age of Onset  . Cancer Father 74    colon, prostate  . Hypertension Father   . Cancer Brother 56    prostate  . Diabetes Mother   . Diabetes Father   . Diabetes Sister   . Stroke Father 66  . Coronary artery disease Father 31    MI    Social History Social History  Substance Use Topics  . Smoking status: Never Smoker   . Smokeless tobacco: Never Used  . Alcohol Use: Yes     Comment: occasional    Review of Systems Constitutional: No fever/chills Eyes: No visual changes. ENT: No sore throat. Cardiovascular: Denies chest pain. Respiratory: Denies shortness of breath. Gastrointestinal: No abdominal pain.  No nausea, no vomiting.  No diarrhea.  No constipation. Genitourinary: Negative for dysuria. Musculoskeletal: Negative for back pain. Skin: Negative for rash.Edema and erythema distal third digit right hand. Neurological: Negative for headaches, focal weakness or numbness. Endocrine:Diabetes, lipidemia and hypertension _________________________________________   PHYSICAL EXAM:  VITAL SIGNS: ED Triage Vitals  Enc Vitals Group     BP 12/25/15 2101 138/80 mmHg     Pulse Rate 12/25/15 2101 98     Resp 12/25/15 2101 18     Temp 12/25/15 2101 98.1 F (36.7 C)     Temp Source 12/25/15 2101 Oral     SpO2 12/25/15 2101 99 %     Weight 12/25/15 2101 282 lb (127.914 kg)     Height 12/25/15 2101  (1.854 m)     Head Cir --      Peak Flow --      Pain Score 12/25/15 2101 4     Pain Loc --      Pain Edu? --      Excl. in GC? --     Constitutional: Alert and oriented. Well appearing and in no acute distress. Eyes: Conjunctivae are normal. PERRL. EOMI. Head: Atraumatic. Nose: No congestion/rhinnorhea. Mouth/Throat: Mucous membranes are moist.  Oropharynx non-erythematous. Neck: No stridor.  No cervical spine tenderness to palpation. Hematological/Lymphatic/Immunilogical: No cervical lymphadenopathy. Cardiovascular:  Normal rate, regular rhythm. Grossly normal heart sounds.  Good peripheral circulation. Respiratory: Normal respiratory effort.  No retractions. Lungs  CTAB. Gastrointestinal: Soft and nontender. No distention. No abdominal bruits. No CVA tenderness. Musculoskeletal: No lower extremity tenderness nor edema.  No joint effusions. Neurologic:  Normal speech and language. No gross focal neurologic deficits are appreciated. No gait instability. Skin:  Edema and erythema distal third digit right hand. Area is nonfluctuant. Psychiatric: Mood and affect are normal. Speech and behavior are normal.  ____________________________________________   LABS (all labs ordered are listed, but only abnormal results are displayed)  Labs Reviewed - No data to display ____________________________________________  EKG   ____________________________________________  RADIOLOGY   ____________________________________________   PROCEDURES  Procedure(s) performed: None  Critical Care performed: No  ____________________________________________   INITIAL IMPRESSION / ASSESSMENT AND PLAN / ED COURSE  Pertinent labs & imaging results that were available during my care of the patient were reviewed by me and considered in my medical decision making (see chart for details).  Felon third digit right hand. Patient given discharge care instructions. Patient given a prescription for Bactrim, tramadol, and ibuprofen. Patient advised to follow-up family doctor no improvement or worsening complaint in 3 days. ____________________________________________   FINAL CLINICAL IMPRESSION(S) / ED DIAGNOSES  Final diagnoses:  Felon, right      NEW MEDICATIONS STARTED DURING THIS VISIT:  New Prescriptions   IBUPROFEN (ADVIL,MOTRIN) 600 MG TABLET    Take 1 tablet (600 mg total) by mouth every 8 (eight) hours as needed.   SULFAMETHOXAZOLE-TRIMETHOPRIM (BACTRIM DS,SEPTRA DS) 800-160 MG TABLET    Take 1 tablet by  mouth 2 (two) times daily.   TRAMADOL (ULTRAM) 50 MG TABLET    Take 1 tablet (50 mg total) by mouth every 6 (six) hours as needed.     Note:  This document was prepared using Dragon voice recognition software and may include unintentional dictation errors.    Joni Reining, PA-C 12/25/15 2123  Sharyn Creamer, MD 12/26/15 0010

## 2015-12-29 DIAGNOSIS — Z7984 Long term (current) use of oral hypoglycemic drugs: Secondary | ICD-10-CM | POA: Insufficient documentation

## 2015-12-29 DIAGNOSIS — L03011 Cellulitis of right finger: Secondary | ICD-10-CM | POA: Insufficient documentation

## 2015-12-29 DIAGNOSIS — I10 Essential (primary) hypertension: Secondary | ICD-10-CM | POA: Diagnosis not present

## 2015-12-29 DIAGNOSIS — L539 Erythematous condition, unspecified: Secondary | ICD-10-CM | POA: Diagnosis present

## 2015-12-29 DIAGNOSIS — Z794 Long term (current) use of insulin: Secondary | ICD-10-CM | POA: Insufficient documentation

## 2015-12-29 DIAGNOSIS — Z79899 Other long term (current) drug therapy: Secondary | ICD-10-CM | POA: Insufficient documentation

## 2015-12-29 DIAGNOSIS — Z792 Long term (current) use of antibiotics: Secondary | ICD-10-CM | POA: Insufficient documentation

## 2015-12-29 DIAGNOSIS — E785 Hyperlipidemia, unspecified: Secondary | ICD-10-CM | POA: Diagnosis not present

## 2015-12-29 DIAGNOSIS — E1140 Type 2 diabetes mellitus with diabetic neuropathy, unspecified: Secondary | ICD-10-CM | POA: Insufficient documentation

## 2015-12-29 NOTE — ED Notes (Signed)
Pt in with co infection to right 3rd finger and was started on antibiotics Wednesday, states infection not improving.

## 2015-12-30 ENCOUNTER — Emergency Department
Admission: EM | Admit: 2015-12-30 | Discharge: 2015-12-30 | Disposition: A | Discharge status: Home / Self Care | Payer: BLUE CROSS/BLUE SHIELD | Attending: Emergency Medicine | Admitting: Emergency Medicine

## 2015-12-30 DIAGNOSIS — L03012 Cellulitis of left finger: Secondary | ICD-10-CM

## 2015-12-30 DIAGNOSIS — L03011 Cellulitis of right finger: Secondary | ICD-10-CM | POA: Diagnosis not present

## 2015-12-30 MED ORDER — CEPHALEXIN 500 MG PO CAPS
500.0000 mg | ORAL_CAPSULE | Freq: Once | ORAL | Status: AC
  Administered 2015-12-30: 500 mg via ORAL
  Filled 2015-12-30: qty 1

## 2015-12-30 MED ORDER — SULFAMETHOXAZOLE-TRIMETHOPRIM 800-160 MG PO TABS
2.0000 | ORAL_TABLET | Freq: Once | ORAL | Status: AC
  Administered 2015-12-30: 2 via ORAL
  Filled 2015-12-30: qty 2

## 2015-12-30 MED ORDER — CEPHALEXIN 500 MG PO CAPS: 500.0000 mg | ORAL_CAPSULE | Freq: Four times a day (QID) | ORAL | Status: AC

## 2015-12-30 MED ORDER — SULFAMETHOXAZOLE-TRIMETHOPRIM 800-160 MG PO TABS: 1.0000 | ORAL_TABLET | Freq: Two times a day (BID) | ORAL | Status: DC

## 2015-12-30 NOTE — Discharge Instructions (Signed)
Paronychia °Paronychia is an infection of the skin that surrounds a nail. It usually affects the skin around a fingernail, but it may also occur near a toenail. It often causes pain and swelling around the nail. This condition may come on suddenly or develop over a longer period. In some cases, a collection of pus (abscess) can form near or under the nail. Usually, paronychia is not serious and it clears up with treatment. °CAUSES °This condition may be caused by bacteria or fungi. It is commonly caused by either Streptococcus or Staphylococcus bacteria. The bacteria or fungi often cause the infection by getting into the affected area through an opening in the skin, such as a cut or a hangnail. °RISK FACTORS °This condition is more likely to develop in: °· People who get their hands wet often, such as those who work as dishwashers, bartenders, or nurses. °· People who bite their fingernails or suck their thumbs. °· People who trim their nails too short. °· People who have hangnails or injured fingertips. °· People who get manicures. °· People who have diabetes. °SYMPTOMS °Symptoms of this condition include: °· Redness and swelling of the skin near the nail. °· Tenderness around the nail when you touch the area. °· Pus-filled bumps under the cuticle. The cuticle is the skin at the base or sides of the nail. °· Fluid or pus under the nail. °· Throbbing pain in the area. °DIAGNOSIS °This condition is usually diagnosed with a physical exam. In some cases, a sample of pus may be taken from an abscess to be tested in a lab. This can help to determine what type of bacteria or fungi is causing the condition. °TREATMENT °Treatment for this condition depends on the cause and severity of the condition. If the condition is mild, it may clear up on its own in a few days. Your health care provider may recommend soaking the affected area in warm water a few times a day. When treatment is needed, the options may  include: °· Antibiotic medicine, if the condition is caused by a bacterial infection. °· Antifungal medicine, if the condition is caused by a fungal infection. °· Incision and drainage, if an abscess is present. In this procedure, the health care provider will cut open the abscess so the pus can drain out. °HOME CARE INSTRUCTIONS °· Soak the affected area in warm water if directed to do so by your health care provider. You may be told to do this for 20 minutes, 2-3 times a day. Keep the area dry in between soakings. °· Take medicines only as directed by your health care provider. °· If you were prescribed an antibiotic medicine, finish all of it even if you start to feel better. °· Keep the affected area clean. °· Do not try to drain a fluid-filled bump yourself. °· If you will be washing dishes or performing other tasks that require your hands to get wet, wear rubber gloves. You should also wear gloves if your hands might come in contact with irritating substances, such as cleaners or chemicals. °· Follow your health care provider's instructions about: °¨ Wound care. °¨ Bandage (dressing) changes and removal. °SEEK MEDICAL CARE IF: °· Your symptoms get worse or do not improve with treatment. °· You have a fever or chills. °· You have redness spreading from the affected area. °· You have continued or increased fluid, blood, or pus coming from the affected area. °· Your finger or knuckle becomes swollen or is difficult to move. °  °  This information is not intended to replace advice given to you by your health care provider. Make sure you discuss any questions you have with your health care provider. °  °Document Released: 12/27/2000 Document Revised: 11/17/2014 Document Reviewed: 06/10/2014 °Elsevier Interactive Patient Education ©2016 [employer] Inc[employer] ° °

## 2015-12-30 NOTE — ED Provider Notes (Signed)
[facility] Emergency Department Provider Note   ____________________________________________  Time seen: Approximately 220 AM  I have reviewed the triage vital signs and the nursing notes.   HISTORY  Chief Complaint Hand Pain   HPI Jonathon [name] is a 50 y.o. male with a history of diabetes who is presenting to the emergency department today with a right middle finger swelling and increased pain. He was seen here on 11 to diagnose the felon and discharged on Bactrim 1 tab, DS twice a day. Since then he has seen pus fill the nail border of the right middle finger. It is also become more painful. He said he checked his sugar this morning and was 210. Is compliant with his medications. Denies fever.Denies biting his nails. Thinks that this all started from an insect bite.   Past Medical History  Diagnosis Date  . T2DM (type 2 diabetes mellitus) (HCC) 2010    lantus started by [place]  . HLD (hyperlipidemia)   . Morbid obesity (HCC)   . Family history of prostate cancer     father, brother  . History of chronic prostatitis 2012    granulomatous, CXR done - negative  . Elevated PSA 2012    with L lobe induration on DRE, normal biopsy 2012 ([facility])  . Genital herpes   . ED (erectile dysfunction) of organic origin   . Seasonal allergies   . History of hypertension     Patient Active Problem List   Diagnosis Date Noted  . Left thigh pain 07/30/2014  . Knee pain, left 05/10/2014  . Insect bite 03/30/2014  . Exposure to toxic chemical 03/04/2014  . Diabetic neuropathy (HCC) 03/04/2014  . Genital herpes   . Penile rash 08/20/2012  . Uncontrolled type 2 diabetes with neuropathy (HCC)   . History of hypertension   . HLD (hyperlipidemia)   . Morbid obesity (HCC)   . ED (erectile dysfunction) of organic origin   . Family history of prostate cancer     Past Surgical History  Procedure Laterality Date  . Prostate biopsy  2012    granulomatous prostatitis    . Ett  2012    WNL (Hank Katrinka Blazing)  . I&d extremity Right 12/03/2012    Procedure: IRRIGATION AND DEBRIDEMENT RIGHT HAND ABSCESS ;  Surgeon: Tami Ribas, MD;  Location: [facility];  Service: Orthopedics;  Laterality: Right;    Current Outpatient Rx  Name  Route  Sig  Dispense  Refill  . atorvastatin (LIPITOR) 10 MG tablet      TAKE 1 TABLET (10 MG TOTAL) BY MOUTH AT BEDTIME. Patient taking differently: TAKE 1 TABLET (10 MG TOTAL) BY MOUTH IN THE MORNING   30 tablet   5   . cephALEXin (KEFLEX) 500 MG capsule   Oral   Take 1 capsule (500 mg total) by mouth 4 (four) times daily.   40 capsule   0   . fenofibrate (TRICOR) 145 MG tablet   Oral   Take 145 mg by mouth daily.         Marland Kitchen gabapentin (NEURONTIN) 300 MG capsule   Oral   Take 300 mg by mouth at bedtime.         Marland Kitchen glucose blood (ONE TOUCH ULTRA TEST) test strip      Check blood sugar twice a day and as instructed. Dx 250.00   100 each   3   . ibuprofen (ADVIL,MOTRIN) 600 MG tablet   Oral  Take 1 tablet (600 mg total) by mouth every 8 (eight) hours as needed.   15 tablet   0   . insulin lispro (HUMALOG) 100 UNIT/ML injection   Subcutaneous   Inject 35 Units into the skin at bedtime.         Marland Kitchen lisinopril (PRINIVIL,ZESTRIL) 20 MG tablet   Oral   Take 20 mg by mouth daily.         . metFORMIN (GLUCOPHAGE) 1000 MG tablet      TAKE 1 TABLET BY MOUTH TWICE A DAY WITH A MEAL Patient taking differently: TAKE 0.5 TABLET BY MOUTH TWICE A DAY WITH A MEAL   60 tablet   3   . methocarbamol (ROBAXIN) 500 MG tablet   Oral   Take 1 tablet (500 mg total) by mouth 2 (two) times daily.   20 tablet   0   . nystatin cream (MYCOSTATIN)   Topical   Apply topically 2 (two) times daily. Will need physical for further refills   30 g   0   . oxyCODONE-acetaminophen (PERCOCET/ROXICET) 5-325 MG tablet   Oral   Take 1 tablet by mouth every 4 (four) hours as needed for severe pain.   10 tablet   0    . sulfamethoxazole-trimethoprim (BACTRIM DS,SEPTRA DS) 800-160 MG tablet   Oral   Take 1 tablet by mouth 2 (two) times daily.   20 tablet   0   . sulfamethoxazole-trimethoprim (BACTRIM DS,SEPTRA DS) 800-160 MG tablet   Oral   Take 1 tablet by mouth 2 (two) times daily. Take in addition to existing bactrim   20 tablet   0   . traMADol (ULTRAM) 50 MG tablet   Oral   Take 1 tablet (50 mg total) by mouth every 6 (six) hours as needed.   20 tablet   0     Allergies Review of patient's allergies indicates no known allergies.  Family History  Problem Relation Age of Onset  . Cancer Father 43    colon, prostate  . Hypertension Father   . Cancer Brother 38    prostate  . Diabetes Mother   . Diabetes Father   . Diabetes Sister   . Stroke Father 51  . Coronary artery disease Father 33    MI    Social History Social History  Substance Use Topics  . Smoking status: Never Smoker   . Smokeless tobacco: Never Used  . Alcohol Use: Yes     Comment: occasional    Review of Systems Constitutional: No fever/chills Eyes: No visual changes. ENT: No sore throat. Cardiovascular: Denies chest pain. Respiratory: Denies shortness of breath. Gastrointestinal: No abdominal pain.  No nausea, no vomiting.  No diarrhea.  No constipation. Genitourinary: Negative for dysuria. Musculoskeletal: Negative for back pain. Skin: As above Neurological: Negative for headaches, focal weakness or numbness.  10-point ROS otherwise negative.  ____________________________________________   PHYSICAL EXAM:  VITAL SIGNS: ED Triage Vitals  Enc Vitals Group     BP 12/29/15 2359 130/82 mmHg     Pulse Rate 12/29/15 2358 97     Resp 12/29/15 2358 18     Temp 12/29/15 2358 98 F (36.7 C)     Temp Source 12/29/15 2358 Oral     SpO2 12/29/15 2358 95 %     Weight 12/29/15 2358 282 lb (127.914 kg)     Height 12/29/15 2358 6\' 1"  (1.854 m)     Head Cir --  Peak Flow --      Pain Score 12/29/15  2359 8     Pain Loc --      Pain Edu? --      Excl. in GC? --     Constitutional: Alert and oriented. Well appearing and in no acute distress. Eyes: Conjunctivae are normal. PERRL. EOMI. Head: Atraumatic. Nose: No congestion/rhinnorhea. Mouth/Throat: Mucous membranes are moist.   Neck: No stridor.   Cardiovascular: Normal rate, regular rhythm. Grossly normal heart sounds.   Respiratory: Normal respiratory effort.  No retractions. Lungs CTAB. Gastrointestinal: Soft and nontender. No distention.  Musculoskeletal: No lower extremity tenderness nor edema.  No joint effusions.Right middle finger with the lateral aspect with a pus filled paronychia there is also lateral swelling with mild swelling of the distal finger tuft. Mild tenderness to palpation. Surrounding erythema in about a 3 on a border around the area of pus which extends from the tip of the right side of the middle finger nail up to the back curvature of the nail. Patient with brisk capillary refill less than 1 second. Sensation is intact. Full range of motion without tenderness to the flexor tendons. Neurologic:  Normal speech and language. No gross focal neurologic deficits are appreciated. No gait instability. Skin:  Skin is warm, dry and intact. No rash noted. Psychiatric: Mood and affect are normal. Speech and behavior are normal.  ____________________________________________   LABS (all labs ordered are listed, but only abnormal results are displayed)  Labs Reviewed - No data to display ____________________________________________  EKG   ____________________________________________  RADIOLOGY   ____________________________________________   PROCEDURES  INCISION AND DRAINAGE Performed by: Arelia LongestSchaevitz,  Shyam Dawson M Consent: Verbal consent obtained. Risks and benefits: risks, benefits and alternatives were discussed Type: abscess  Body area: Right middle finger paronychia  Anesthesia: l none  Incision was made  with a 18-gauge needle    Drainage: purulent  Drainage amount: 2 mL     Patient tolerance: Patient tolerated the procedure well with no immediate complications. covered with Band-Aid.      ____________________________________________   INITIAL IMPRESSION / ASSESSMENT AND PLAN / ED COURSE  Pertinent labs & imaging results that were available during my care of the patient were reviewed by me and considered in my medical decision making (see chart for details).  Patient with relief of pressure after the procedure. Will increase antibiotic to 2 DS Bactrim tabs twice a day and add Keflex. Given return precautions return for any worsening or concerning symptoms. ____________________________________________   FINAL CLINICAL IMPRESSION(S) / ED DIAGNOSES  Final diagnoses:  Paronychia, left      NEW MEDICATIONS STARTED DURING THIS VISIT:  New Prescriptions   CEPHALEXIN (KEFLEX) 500 MG CAPSULE    Take 1 capsule (500 mg total) by mouth 4 (four) times daily.   SULFAMETHOXAZOLE-TRIMETHOPRIM (BACTRIM DS,SEPTRA DS) 800-160 MG TABLET    Take 1 tablet by mouth 2 (two) times daily. Take in addition to existing bactrim     Note:  This document was prepared using Dragon voice recognition software and may include unintentional dictation errors.    Myrna Blazeravid Matthew Linea Calles, MD 12/30/15 [phone]

## 2016-04-03 DIAGNOSIS — Z7984 Long term (current) use of oral hypoglycemic drugs: Secondary | ICD-10-CM | POA: Insufficient documentation

## 2016-04-03 DIAGNOSIS — R109 Unspecified abdominal pain: Secondary | ICD-10-CM | POA: Diagnosis present

## 2016-04-03 DIAGNOSIS — Z794 Long term (current) use of insulin: Secondary | ICD-10-CM | POA: Diagnosis not present

## 2016-04-03 DIAGNOSIS — E119 Type 2 diabetes mellitus without complications: Secondary | ICD-10-CM | POA: Diagnosis not present

## 2016-04-03 DIAGNOSIS — Z79899 Other long term (current) drug therapy: Secondary | ICD-10-CM | POA: Insufficient documentation

## 2016-04-03 LAB — CBC
HCT: 40.7 % (ref 40.0–52.0)
Hemoglobin: 14.3 g/dL (ref 13.0–18.0)
MCH: 31.4 pg (ref 26.0–34.0)
MCHC: 35.2 g/dL (ref 32.0–36.0)
MCV: 89.3 fL (ref 80.0–100.0)
PLATELETS: 262 10*3/uL (ref 150–440)
RBC: 4.56 MIL/uL (ref 4.40–5.90)
RDW: 14.1 % (ref 11.5–14.5)
WBC: 6.3 10*3/uL (ref 3.8–10.6)

## 2016-04-03 NOTE — ED Triage Notes (Signed)
Patient ambulatory to triage with steady gait, without difficulty or distress noted; pt reports left flank pain, nonradiating x month with no accomp symptoms

## 2016-04-04 ENCOUNTER — Emergency Department: Payer: Non-veteran care

## 2016-04-04 ENCOUNTER — Emergency Department
Admission: EM | Admit: 2016-04-04 | Discharge: 2016-04-04 | Disposition: A | Discharge status: Home / Self Care | Payer: Non-veteran care | Attending: Emergency Medicine | Admitting: Emergency Medicine

## 2016-04-04 DIAGNOSIS — R109 Unspecified abdominal pain: Secondary | ICD-10-CM

## 2016-04-04 LAB — URINALYSIS COMPLETE WITH MICROSCOPIC (ARMC ONLY)
BACTERIA UA: NONE SEEN
Bilirubin Urine: NEGATIVE
HGB URINE DIPSTICK: NEGATIVE
Ketones, ur: NEGATIVE mg/dL
Leukocytes, UA: NEGATIVE
NITRITE: NEGATIVE
PH: 5 (ref 5.0–8.0)
PROTEIN: NEGATIVE mg/dL
Specific Gravity, Urine: 1.029 (ref 1.005–1.030)

## 2016-04-04 LAB — BASIC METABOLIC PANEL
Anion gap: 5 (ref 5–15)
BUN: 20 mg/dL (ref 6–20)
CALCIUM: 9.5 mg/dL (ref 8.9–10.3)
CO2: 25 mmol/L (ref 22–32)
CREATININE: 1.08 mg/dL (ref 0.61–1.24)
Chloride: 106 mmol/L (ref 101–111)
Glucose, Bld: 340 mg/dL — ABNORMAL HIGH (ref 65–99)
Potassium: 3.9 mmol/L (ref 3.5–5.1)
SODIUM: 136 mmol/L (ref 135–145)

## 2016-04-04 MED ORDER — DIAZEPAM 2 MG PO TABS: 2.0000 mg | ORAL_TABLET | Freq: Three times a day (TID) | ORAL | 0 refills | Status: DC | PRN

## 2016-04-04 MED ORDER — ONDANSETRON HCL 4 MG/2ML IJ SOLN
4.0000 mg | Freq: Once | INTRAMUSCULAR | Status: AC
  Administered 2016-04-04: 4 mg via INTRAVENOUS
  Filled 2016-04-04: qty 2

## 2016-04-04 MED ORDER — MORPHINE SULFATE (PF) 4 MG/ML IV SOLN
4.0000 mg | Freq: Once | INTRAVENOUS | Status: AC
  Administered 2016-04-04: 4 mg via INTRAVENOUS
  Filled 2016-04-04: qty 1

## 2016-04-04 MED ORDER — SODIUM CHLORIDE 0.9 % IV BOLUS (SEPSIS)
1000.0000 mL | Freq: Once | INTRAVENOUS | Status: AC
  Administered 2016-04-04: 1000 mL via INTRAVENOUS

## 2016-04-04 MED ORDER — KETOROLAC TROMETHAMINE 30 MG/ML IJ SOLN
30.0000 mg | Freq: Once | INTRAMUSCULAR | Status: AC
  Administered 2016-04-04: 30 mg via INTRAVENOUS
  Filled 2016-04-04: qty 1

## 2016-04-04 MED ORDER — LIDOCAINE 5 % EX PTCH
1.0000 | MEDICATED_PATCH | CUTANEOUS | Status: DC
  Administered 2016-04-04: 1 via TRANSDERMAL
  Filled 2016-04-04: qty 1

## 2016-04-04 MED ORDER — TRAMADOL HCL 50 MG PO TABS: 50.0000 mg | ORAL_TABLET | Freq: Four times a day (QID) | ORAL | 0 refills | Status: DC | PRN

## 2016-04-04 MED ORDER — LIDOCAINE 5 % EX PTCH: 1.0000 | MEDICATED_PATCH | Freq: Two times a day (BID) | CUTANEOUS | 0 refills | Status: AC

## 2016-04-04 NOTE — ED Notes (Signed)
Discharge instructions reviewed with patient. Patient verbalized understanding. Patient ambulated to lobby without difficulty.   

## 2016-04-04 NOTE — ED Provider Notes (Signed)
[facility] Emergency Department Provider Note   ____________________________________________  Seen at approximately 211   (approximate)  I have reviewed the triage vital signs and the nursing notes.   HISTORY  Chief Complaint Flank Pain    HPI Jonathon [name] is a 50 y.o. male who comes into the hospital today with left side pain. He reports that he's had this pain for 3 weeks to 1 month. The patient thinks that this may be due to his kidneys. He did have a neighbor who is a Publishing rights manager evaluate him and he was told that he had some CVA tenderness. The patient reports that tonight the pain was unbearable. The pain is a 9-10 out of 10 in intensity. The patient reports that he has been taking Tylenol for the pain. He was here a while back for similar episode and it was decided that it was a muscular pain. The patient denies any trauma. He denies any hematuria, dysuria, dark urine or foul odor. The patient denies fever, nausea, vomiting. He did have chest pain 2 weeks ago reports that he's had nothing since then. The patient is here because he is unable to tolerate this pain in his back any longer.   Past Medical History:  Diagnosis Date  . ED (erectile dysfunction) of organic origin   . Elevated PSA 2012   with L lobe induration on DRE, normal biopsy 2012 ([facility])  . Family history of prostate cancer    father, brother  . Genital herpes   . History of chronic prostatitis 2012   granulomatous, CXR done - negative  . History of hypertension   . HLD (hyperlipidemia)   . Morbid obesity (HCC)   . Seasonal allergies   . T2DM (type 2 diabetes mellitus) (HCC) 2010   lantus started by [facility]    Patient Active Problem List   Diagnosis Date Noted  . Left thigh pain 07/30/2014  . Knee pain, left 05/10/2014  . Insect bite 03/30/2014  . Exposure to toxic chemical 03/04/2014  . Diabetic neuropathy (HCC) 03/04/2014  . Genital herpes   . Penile rash 08/20/2012    . Uncontrolled type 2 diabetes with neuropathy (HCC)   . History of hypertension   . HLD (hyperlipidemia)   . Morbid obesity (HCC)   . ED (erectile dysfunction) of organic origin   . Family history of prostate cancer     Past Surgical History:  Procedure Laterality Date  . ETT  2012   WNL (Hank Smith)  . I&D EXTREMITY Right 12/03/2012   Procedure: IRRIGATION AND DEBRIDEMENT RIGHT HAND ABSCESS ;  Surgeon: Tami Ribas, MD;  Location: [facility];  Service: Orthopedics;  Laterality: Right;  . PROSTATE BIOPSY  2012   granulomatous prostatitis    Prior to Admission medications   Medication Sig Start Date End Date Taking? Authorizing Provider  atorvastatin (LIPITOR) 10 MG tablet TAKE 1 TABLET (10 MG TOTAL) BY MOUTH AT BEDTIME. Patient taking differently: TAKE 1 TABLET (10 MG TOTAL) BY MOUTH IN THE MORNING 03/04/13   [name], MD  diazepam (VALIUM) 2 MG tablet Take 1 tablet (2 mg total) by mouth every 8 (eight) hours as needed for anxiety. 04/04/16 04/04/17  Rebecka Apley, MD  fenofibrate (TRICOR) 145 MG tablet Take 145 mg by mouth daily.    Historical Provider, MD  gabapentin (NEURONTIN) 300 MG capsule Take 300 mg by mouth at bedtime.    Historical Provider, MD  glucose blood (ONE TOUCH ULTRA  TEST) test strip Check blood sugar twice a day and as instructed. Dx 250.00 07/14/13   [name], MD  ibuprofen (ADVIL,MOTRIN) 600 MG tablet Take 1 tablet (600 mg total) by mouth every 8 (eight) hours as needed. 12/25/15   Joni Reining, PA-C  insulin lispro (HUMALOG) 100 UNIT/ML injection Inject 35 Units into the skin at bedtime.    Historical Provider, MD  lidocaine (LIDODERM) 5 % Place 1 patch onto the skin every 12 (twelve) hours. Remove & Discard patch within 12 hours or as directed by MD 04/04/16 04/04/17  Rebecka Apley, MD  lisinopril (PRINIVIL,ZESTRIL) 20 MG tablet Take 20 mg by mouth daily.    Historical Provider, MD  metFORMIN (GLUCOPHAGE) 1000 MG tablet TAKE  1 TABLET BY MOUTH TWICE A DAY WITH A MEAL Patient taking differently: TAKE 0.5 TABLET BY MOUTH TWICE A DAY WITH A MEAL 03/26/14   [name], MD  methocarbamol (ROBAXIN) 500 MG tablet Take 1 tablet (500 mg total) by mouth 2 (two) times daily. 08/09/15   Elpidio Anis, PA-C  nystatin cream (MYCOSTATIN) Apply topically 2 (two) times daily. Will need physical for further refills 09/24/15   [name], MD  oxyCODONE-acetaminophen (PERCOCET/ROXICET) 5-325 MG tablet Take 1 tablet by mouth every 4 (four) hours as needed for severe pain. 08/09/15   Elpidio Anis, PA-C  sulfamethoxazole-trimethoprim (BACTRIM DS,SEPTRA DS) 800-160 MG tablet Take 1 tablet by mouth 2 (two) times daily. 12/25/15   Joni Reining, PA-C  sulfamethoxazole-trimethoprim (BACTRIM DS,SEPTRA DS) 800-160 MG tablet Take 1 tablet by mouth 2 (two) times daily. Take in addition to existing bactrim 12/30/15   Myrna Blazer, MD  traMADol (ULTRAM) 50 MG tablet Take 1 tablet (50 mg total) by mouth every 6 (six) hours as needed. 12/25/15 12/24/16  Joni Reining, PA-C  traMADol (ULTRAM) 50 MG tablet Take 1 tablet (50 mg total) by mouth every 6 (six) hours as needed. 04/04/16   Rebecka Apley, MD    Allergies Review of patient's allergies indicates no known allergies.  Family History  Problem Relation Age of Onset  . Cancer Father 27    colon, prostate  . Hypertension Father   . Cancer Brother 74    prostate  . Diabetes Mother   . Diabetes Father   . Diabetes Sister   . Stroke Father 57  . Coronary artery disease Father 89    MI    Social History Social History  Substance Use Topics  . Smoking status: Never Smoker  . Smokeless tobacco: Never Used  . Alcohol use Yes     Comment: occasional    Review of Systems Constitutional: No fever/chills Eyes: No visual changes. ENT: No sore throat. Cardiovascular: Denies chest pain. Respiratory: Denies shortness of breath. Gastrointestinal: No abdominal pain.  No  nausea, no vomiting.  No diarrhea.  No constipation. Genitourinary: Negative for dysuria. Musculoskeletal: Left flank pain Skin: Negative for rash. Neurological: Negative for headaches, focal weakness or numbness.  10-point ROS otherwise negative.  ____________________________________________   PHYSICAL EXAM:  VITAL SIGNS: ED Triage Vitals  Enc Vitals Group     BP 04/03/16 2326 (!) 142/88     Pulse Rate 04/03/16 2326 83     Resp 04/03/16 2326 18     Temp 04/03/16 2326 98.1 F (36.7 C)     Temp Source 04/03/16 2326 Oral     SpO2 04/03/16 2326 98 %     Weight 04/03/16 2316 299 lb (135.6 kg)  Height 04/03/16 2316 6\' 1"  (1.854 m)     Head Circumference --      Peak Flow --      Pain Score 04/03/16 2316 9     Pain Loc --      Pain Edu? --      Excl. in GC? --     Constitutional: Alert and oriented. Well appearing and in moderate distress. Eyes: Conjunctivae are normal. PERRL. EOMI. Head: Atraumatic. Nose: No congestion/rhinnorhea. Mouth/Throat: Mucous membranes are moist.  Oropharynx non-erythematous. Cardiovascular: Normal rate, regular rhythm. Grossly normal heart sounds.  Good peripheral circulation. Respiratory: Normal respiratory effort.  No retractions. Lungs CTAB. Gastrointestinal: Soft and nontender. No distention. Left CVA tenderness. Musculoskeletal: No lower extremity tenderness nor edema.   Neurologic:  Normal speech and language.  Skin:  Skin is warm, dry and intact. Marland Kitchen. Psychiatric: Mood and affect are normal.   ____________________________________________   LABS (all labs ordered are listed, but only abnormal results are displayed)  Labs Reviewed  URINALYSIS COMPLETEWITH MICROSCOPIC ([facility] ONLY) - Abnormal; Notable for the following:       Result Value   Color, Urine STRAW (*)    APPearance CLEAR (*)    Glucose, UA >500 (*)    Squamous Epithelial / LPF 0-5 (*)    All other components within normal limits  BASIC METABOLIC PANEL - Abnormal; Notable  for the following:    Glucose, Bld 340 (*)    All other components within normal limits  CBC   ____________________________________________  EKG  none ____________________________________________  RADIOLOGY  CT renal stone study ____________________________________________   PROCEDURES  Procedure(s) performed: None  Procedures  Critical Care performed: No  ____________________________________________   INITIAL IMPRESSION / ASSESSMENT AND PLAN / ED COURSE  Pertinent labs & imaging results that were available during my care of the patient were reviewed by me and considered in my medical decision making (see chart for details).  This is a 50 year old male who comes in today with some flank pain. The patient did have some blood work drawn does not have any blood in his urine but I will still send the patient for a CT scan to evaluate for possible kidney stone. The patient receive a liter of saline and I will await the results of his imaging to determine his pain medication.  Clinical Course  Value Comment By Time  CT Renal Stone Study 1. No CT evidence for nephrolithiasis or obstructive uropathy. 2. No other acute intra-abdominal or pelvic process identified. 3. Mild colonic diverticulosis without evidence for acute diverticulitis.   Rebecka ApleyAllison P Naw Lasala, MD 09/19 [phone]   The patient a dose of Toradol and a Lidoderm patch to his back. The patient's CT scan is unremarkable and does not show any stones. I am unsure what is causing the patient's pain but it is possible that he has some musculoskeletal pain. The patient will be discharged home to follow-up with his primary care physician. The patient has no further questions or concerns at this time.  ____________________________________________   FINAL CLINICAL IMPRESSION(S) / ED DIAGNOSES  Final diagnoses:  Flank pain      NEW MEDICATIONS STARTED DURING THIS VISIT:  New Prescriptions   DIAZEPAM (VALIUM) 2 MG TABLET     Take 1 tablet (2 mg total) by mouth every 8 (eight) hours as needed for anxiety.   LIDOCAINE (LIDODERM) 5 %    Place 1 patch onto the skin every 12 (twelve) hours. Remove & Discard patch within 12 hours or  as directed by MD   TRAMADOL (ULTRAM) 50 MG TABLET    Take 1 tablet (50 mg total) by mouth every 6 (six) hours as needed.     Note:  This document was prepared using Dragon voice recognition software and may include unintentional dictation errors.    Rebecka Apley, MD 04/04/16 [phone]

## 2016-04-04 NOTE — ED Notes (Signed)
Patient transported to CT 

## 2016-07-17 LAB — HM DIABETES EYE EXAM

## 2016-08-08 ENCOUNTER — Encounter (HOSPITAL_COMMUNITY): Payer: Self-pay | Admitting: Emergency Medicine

## 2016-08-08 ENCOUNTER — Telehealth: Payer: Self-pay | Admitting: Family Medicine

## 2016-08-08 ENCOUNTER — Emergency Department (HOSPITAL_COMMUNITY): Payer: Non-veteran care

## 2016-08-08 ENCOUNTER — Emergency Department (HOSPITAL_COMMUNITY)
Admission: EM | Admit: 2016-08-08 | Discharge: 2016-08-08 | Disposition: A | Discharge status: Home / Self Care | Payer: Non-veteran care | Attending: Emergency Medicine | Admitting: Emergency Medicine

## 2016-08-08 ENCOUNTER — Ambulatory Visit: Payer: Self-pay | Admitting: Nurse Practitioner

## 2016-08-08 DIAGNOSIS — Z79899 Other long term (current) drug therapy: Secondary | ICD-10-CM | POA: Insufficient documentation

## 2016-08-08 DIAGNOSIS — E1140 Type 2 diabetes mellitus with diabetic neuropathy, unspecified: Secondary | ICD-10-CM | POA: Diagnosis not present

## 2016-08-08 DIAGNOSIS — R0789 Other chest pain: Secondary | ICD-10-CM | POA: Diagnosis not present

## 2016-08-08 DIAGNOSIS — Z7984 Long term (current) use of oral hypoglycemic drugs: Secondary | ICD-10-CM | POA: Diagnosis not present

## 2016-08-08 DIAGNOSIS — H532 Diplopia: Secondary | ICD-10-CM

## 2016-08-08 DIAGNOSIS — R51 Headache: Secondary | ICD-10-CM | POA: Diagnosis not present

## 2016-08-08 DIAGNOSIS — R079 Chest pain, unspecified: Secondary | ICD-10-CM | POA: Diagnosis present

## 2016-08-08 DIAGNOSIS — Z8546 Personal history of malignant neoplasm of prostate: Secondary | ICD-10-CM | POA: Insufficient documentation

## 2016-08-08 DIAGNOSIS — M791 Myalgia: Secondary | ICD-10-CM | POA: Insufficient documentation

## 2016-08-08 DIAGNOSIS — R519 Headache, unspecified: Secondary | ICD-10-CM

## 2016-08-08 LAB — CBC
HCT: 44.6 % (ref 39.0–52.0)
HEMOGLOBIN: 15.2 g/dL (ref 13.0–17.0)
MCH: 30.3 pg (ref 26.0–34.0)
MCHC: 34.1 g/dL (ref 30.0–36.0)
MCV: 89 fL (ref 78.0–100.0)
PLATELETS: 271 10*3/uL (ref 150–400)
RBC: 5.01 MIL/uL (ref 4.22–5.81)
RDW: 13.7 % (ref 11.5–15.5)
WBC: 5.7 10*3/uL (ref 4.0–10.5)

## 2016-08-08 LAB — I-STAT TROPONIN, ED
TROPONIN I, POC: 0.01 ng/mL (ref 0.00–0.08)
TROPONIN I, POC: 0 ng/mL (ref 0.00–0.08)

## 2016-08-08 LAB — BASIC METABOLIC PANEL
Anion gap: 6 (ref 5–15)
BUN: 12 mg/dL (ref 6–20)
CALCIUM: 9.5 mg/dL (ref 8.9–10.3)
CHLORIDE: 108 mmol/L (ref 101–111)
CO2: 26 mmol/L (ref 22–32)
CREATININE: 1.04 mg/dL (ref 0.61–1.24)
GFR calc non Af Amer: >60 mL/min (ref >60)
Glucose, Bld: 166 mg/dL — ABNORMAL HIGH (ref 65–99)
Potassium: 4.1 mmol/L (ref 3.5–5.1)
SODIUM: 140 mmol/L (ref 135–145)

## 2016-08-08 MED ORDER — KETOROLAC TROMETHAMINE 30 MG/ML IJ SOLN
30.0000 mg | Freq: Once | INTRAMUSCULAR | Status: DC
  Filled 2016-08-08: qty 1

## 2016-08-08 MED ORDER — DIPHENHYDRAMINE HCL 50 MG/ML IJ SOLN
25.0000 mg | Freq: Once | INTRAMUSCULAR | Status: DC
  Filled 2016-08-08: qty 1

## 2016-08-08 MED ORDER — DIPHENHYDRAMINE HCL 25 MG PO CAPS
25.0000 mg | ORAL_CAPSULE | Freq: Once | ORAL | Status: AC
  Administered 2016-08-08: 25 mg via ORAL
  Filled 2016-08-08: qty 1

## 2016-08-08 MED ORDER — KETOROLAC TROMETHAMINE 60 MG/2ML IM SOLN
60.0000 mg | Freq: Once | INTRAMUSCULAR | Status: AC
  Administered 2016-08-08: 60 mg via INTRAMUSCULAR
  Filled 2016-08-08: qty 2

## 2016-08-08 MED ORDER — METOCLOPRAMIDE HCL 10 MG PO TABS
10.0000 mg | ORAL_TABLET | Freq: Once | ORAL | Status: AC
  Administered 2016-08-08: 10 mg via ORAL
  Filled 2016-08-08: qty 1

## 2016-08-08 MED ORDER — METOCLOPRAMIDE HCL 5 MG/ML IJ SOLN
10.0000 mg | Freq: Once | INTRAMUSCULAR | Status: DC
  Filled 2016-08-08: qty 2

## 2016-08-08 NOTE — Telephone Encounter (Signed)
Per Zella Ballobin at front desk pt called and cancelled appt with Dr Elease EtienneNche because pt was going to [facility].

## 2016-08-08 NOTE — ED Provider Notes (Signed)
[facility]-EMERGENCY DEPT Provider Note   CSN: [id number] Arrival date & time: 08/08/16  1303     History   Chief Complaint Chief Complaint  Patient presents with  . Headache  . Diplopia  . Chest Pain    HPI Jonathon [name] is a 51 y.o. male.  HPI   Patient with hx DM, prostatitis, obesity p/w diplopia, headache that began yesterday. The diplopia began around 11am yesterday, has been constant.  Only occurs with both eyes, looking through one has normal vision.  Symptoms are worse with looking to the right.  Last night around 7pm he develop a headache over his right eye/right forehead.  The pain is sharp and has gradually worsened.   Has also had right shoulder pain and tingling in his right 1st-3rd fingers x 2 weeks.  The pain in his right arm is a single point of pain over the right mid-upper arm. He does heavy lifting at work.  Has point tenderness over the arm.  Has used OTC muscle rub without improvement.  Mild right upper back soreness associated.     Intermittent right sided chest pain off and on x 2 weeks, occurs at rest only, comes and goes, is dull.  Has also been waking up with  diaphoresis and nausea that has been occurring around 3am daily for the past week - his blood sugars have been around 50 each time.  He has lost 50 pounds recently but his diabetes medication has not been changed.  Takes Lantus, metformin, and glipizide.  He did decrease his Lantus dose from 30 to 25 but continues to take both the Lantus and the glipizide at night.  In the mornings his blood sugar is low, usually 50s and in the evenings it is 150-170.    Past Medical History:  Diagnosis Date  . ED (erectile dysfunction) of organic origin   . Elevated PSA 2012   with L lobe induration on DRE, normal biopsy 2012 ([facility])  . Family history of prostate cancer    father, brother  . Genital herpes   . History of chronic prostatitis 2012   granulomatous, CXR done - negative  . History of hypertension   . HLD  (hyperlipidemia)   . Morbid obesity (HCC)   . Seasonal allergies   . T2DM (type 2 diabetes mellitus) (HCC) 2010   lantus started by [facility]    Patient Active Problem List   Diagnosis Date Noted  . Left thigh pain 07/30/2014  . Knee pain, left 05/10/2014  . Insect bite 03/30/2014  . Exposure to toxic chemical 03/04/2014  . Diabetic neuropathy (HCC) 03/04/2014  . Genital herpes   . Penile rash 08/20/2012  . Uncontrolled type 2 diabetes with neuropathy (HCC)   . History of hypertension   . HLD (hyperlipidemia)   . Morbid obesity (HCC)   . ED (erectile dysfunction) of organic origin   . Family history of prostate cancer     Past Surgical History:  Procedure Laterality Date  . ETT  2012   WNL (Hank Smith)  . I&D EXTREMITY Right 12/03/2012   Procedure: IRRIGATION AND DEBRIDEMENT RIGHT HAND ABSCESS ;  Surgeon: Tami Ribas, MD;  Location: [facility];  Service: Orthopedics;  Laterality: Right;  . PROSTATE BIOPSY  2012   granulomatous prostatitis       Home Medications    Prior to Admission medications   Medication Sig Start Date End Date Taking? Authorizing Provider  atorvastatin (LIPITOR) 10 MG tablet TAKE 1  TABLET (10 MG TOTAL) BY MOUTH AT BEDTIME. Patient taking differently: TAKE 1 TABLET (10 MG TOTAL) BY MOUTH IN THE MORNING 03/04/13  Yes Eustaquio BoydenJavier Gutierrez, MD  fenofibrate (TRICOR) 145 MG tablet Take 145 mg by mouth daily.   Yes Historical Provider, MD  gabapentin (NEURONTIN) 300 MG capsule Take 300 mg by mouth 2 (two) times daily.    Yes Historical Provider, MD  ibuprofen (ADVIL,MOTRIN) 600 MG tablet Take 1 tablet (600 mg total) by mouth every 8 (eight) hours as needed. 12/25/15  Yes Joni Reiningonald K Smith, PA-C  insulin glargine (LANTUS) 100 UNIT/ML injection Inject 35 Units into the skin at bedtime.   Yes Historical Provider, MD  lisinopril (PRINIVIL,ZESTRIL) 20 MG tablet Take 10 mg by mouth daily.    Yes Historical Provider, MD  metFORMIN (GLUCOPHAGE) 500 MG tablet Take  500 mg by mouth 2 (two) times daily with a meal.   Yes Historical Provider, MD  traMADol (ULTRAM) 50 MG tablet Take 1 tablet (50 mg total) by mouth every 6 (six) hours as needed. 04/04/16  Yes Rebecka ApleyAllison P Webster, MD  diazepam (VALIUM) 2 MG tablet Take 1 tablet (2 mg total) by mouth every 8 (eight) hours as needed for anxiety. Patient not taking: Reported on 08/08/2016 04/04/16 04/04/17  Rebecka ApleyAllison P Webster, MD  glucose blood (ONE TOUCH ULTRA TEST) test strip Check blood sugar twice a day and as instructed. Dx 250.00 07/14/13   Eustaquio BoydenJavier Gutierrez, MD  lidocaine (LIDODERM) 5 % Place 1 patch onto the skin every 12 (twelve) hours. Remove & Discard patch within 12 hours or as directed by MD 04/04/16 04/04/17  Rebecka ApleyAllison P Webster, MD  metFORMIN (GLUCOPHAGE) 1000 MG tablet TAKE 1 TABLET BY MOUTH TWICE A DAY WITH A MEAL Patient not taking: Reported on 08/08/2016 03/26/14   Eustaquio BoydenJavier Gutierrez, MD  methocarbamol (ROBAXIN) 500 MG tablet Take 1 tablet (500 mg total) by mouth 2 (two) times daily. Patient not taking: Reported on 08/08/2016 08/09/15   Elpidio AnisShari Upstill, PA-C  nystatin cream (MYCOSTATIN) Apply topically 2 (two) times daily. Will need physical for further refills Patient not taking: Reported on 08/08/2016 09/24/15   Eustaquio BoydenJavier Gutierrez, MD  oxyCODONE-acetaminophen (PERCOCET/ROXICET) 5-325 MG tablet Take 1 tablet by mouth every 4 (four) hours as needed for severe pain. Patient not taking: Reported on 08/08/2016 08/09/15   Elpidio AnisShari Upstill, PA-C  sulfamethoxazole-trimethoprim (BACTRIM DS,SEPTRA DS) 800-160 MG tablet Take 1 tablet by mouth 2 (two) times daily. Patient not taking: Reported on 08/08/2016 12/25/15   Joni Reiningonald K Smith, PA-C  sulfamethoxazole-trimethoprim (BACTRIM DS,SEPTRA DS) 800-160 MG tablet Take 1 tablet by mouth 2 (two) times daily. Take in addition to existing bactrim Patient not taking: Reported on 08/08/2016 12/30/15   Myrna Blazeravid Matthew Schaevitz, MD  traMADol (ULTRAM) 50 MG tablet Take 1 tablet (50 mg total) by mouth every  6 (six) hours as needed. Patient not taking: Reported on 08/08/2016 12/25/15 12/24/16  Joni Reiningonald K Smith, PA-C    Family History Family History  Problem Relation Age of Onset  . Cancer Father 5974    colon, prostate  . Hypertension Father   . Diabetes Father   . Stroke Father 2870  . Coronary artery disease Father 8970    MI  . Cancer Brother [phone]    prostate  . Diabetes Mother   . Diabetes Sister     Social History Social History  Substance Use Topics  . Smoking status: Never Smoker  . Smokeless tobacco: Never Used  . Alcohol use Yes  Comment: occasional     Allergies   Patient has no known allergies.   Review of Systems Review of Systems  All other systems reviewed and are negative.    Physical Exam Updated Vital Signs BP 128/82   Pulse 75   Temp 98.1 F (36.7 C)   Resp 16   SpO2 100%   Physical Exam  Constitutional: He appears well-developed and well-nourished. No distress.  HENT:  Head: Normocephalic and atraumatic.  Neck: Neck supple.  Cardiovascular: Normal rate and regular rhythm.   Pulmonary/Chest: Effort normal and breath sounds normal. No respiratory distress. He has no wheezes. He has no rales.  Abdominal: Soft. He exhibits no distension and no mass. There is no tenderness. There is no rebound and no guarding.  Neurological: He is alert. He exhibits normal muscle tone.  CN II-XII intact, EOMs intact though c/o worsening diplopia with rightward gaze, no pronator drift, grip strengths equal bilaterally; strength 5/5 in all extremities, sensation intact in all extremities; finger to nose, heel to shin, rapid alternating movements normal.    Skin: He is not diaphoretic.  Nursing note and vitals reviewed.    ED Treatments / Results  Labs (all labs ordered are listed, but only abnormal results are displayed) Labs Reviewed  BASIC METABOLIC PANEL - Abnormal; Notable for the following:       Result Value   Glucose, Bld 166 (*)    All other components  within normal limits  CBC  I-STAT TROPOININ, ED  Rosezena Sensor, ED    EKG  EKG Interpretation None       Radiology Dg Chest 2 View  Result Date: 08/08/2016 CLINICAL DATA:  51 year old male with chest and right arm pain for 2 weeks. Hypoglycemia, double vision and nausea for 1 day. Initial encounter. EXAM: CHEST  2 VIEW COMPARISON:  04/04/2011. FINDINGS: Lung volumes remain normal. Mediastinal contours are stable and within normal limits. Visualized tracheal air column is within normal limits. The lungs are clear. No pneumothorax or pleural effusion. Negative visible bowel gas pattern. No acute osseous abnormality identified. IMPRESSION: Negative.  No acute cardiopulmonary abnormality. Electronically Signed   By: Odessa Fleming M.D.   On: 08/08/2016 14:33   Ct Head Wo Contrast  Result Date: 08/08/2016 CLINICAL DATA:  Double vision and headache EXAM: CT HEAD WITHOUT CONTRAST TECHNIQUE: Contiguous axial images were obtained from the base of the skull through the vertex without intravenous contrast. COMPARISON:  None. FINDINGS: Brain: No acute intraparenchymal hemorrhage, midline shift or edema. Normal size ventricles. No acute large vascular territory infarction. No extra-axial fluid. Minimal likely remote small vessel ischemic disease periventricular white matter. Vascular: No hyperdense vessel or unexpected calcification. Skull: Normal. Negative for fracture or focal lesion. Sinuses/Orbits: Clear mastoids bilaterally. The visualized paranasal sinuses are nonacute. Orbits are intact and symmetric in appearance. Other: None IMPRESSION: No acute intracranial abnormality. Minimal small vessel ischemic disease of periventricular white matter. Electronically Signed   By: Tollie Eth M.D.   On: 08/08/2016 18:02    Procedures Procedures (including critical care time)  Medications Ordered in ED Medications - No data to display   Initial Impression / Assessment and Plan / ED Course  I have reviewed  the triage vital signs and the nursing notes.  Pertinent labs & imaging results that were available during my care of the patient were reviewed by me and considered in my medical decision making (see chart for details).  Clinical Course as of Aug 09 2023  Tue Aug 08, 2016  1930 Discussed pt with Dr Roseanne Reno, neurology, who recommends MR brain without contrast and revisit once the results are back.    [EW]    Clinical Course User Index [EW] Trixie Dredge, PA-C   Afebrile nontoxic patient with diabetes and obesity p/w diplopia and right sided headache.  CT head negative.  MRI brain pending at change of shift.   Pt also with episodes of hypoglycemia every night around 3am.  If discharged, pt will be advised to discontinue glipizide and follow up with PCP - if admitted, to be managed and adjusted by admitting MD.   Pt also with intermittent nonexertional very atypical chest pain and right arm muscular pain - not clinically worrisome.  Workup reassuring.   Signed out to Terance Hart, PA-C, at change of shift pending MRI results and discussion with neurology.    Final Clinical Impressions(s) / ED Diagnoses   Final diagnoses:  Diplopia  Bad headache  Muscular pain  Atypical chest pain    New Prescriptions Current Discharge Medication List       Trixie Dredge, Cordelia Poche 08/08/16 2025    Bethann Berkshire, MD 08/08/16 2328

## 2016-08-08 NOTE — ED Notes (Signed)
PER EDP, patient able to eat and drink.

## 2016-08-08 NOTE — ED Notes (Signed)
Pt complaining of double vision in both eyes with headache over right eye.  States he has had numbness and tingling in right arm and hand

## 2016-08-08 NOTE — ED Provider Notes (Signed)
Patient signed out to me by E West PA-C at shift change. In brief he is a 51 year old male who presents with complaints of diplopia and headache with associated right arm tingling and nausea since yesterday. Plan is to obtain MRI to r/o brain stem stroke and reconsult Dr Roseanne RenoStewart following results. Patient also has complaints of hypoglycemic episodes over the past several weeks. He states he has had 50# intentional weight loss over the past several months which he thinks is contributing to this. Discussed d/c of glipizide. He has already decreasd his Lantus on his own and has made appt with his PCP on Thursday. Finally he has been complaining of intermittent chest pain. W/o in ED has been negative and pain is atypical. Follow up with PCP regarding this as well.  MRI brain is unremarkable. Updated Dr. Roseanne RenoStewart - recommends treating HA and follow up with PCP. Pt was difficult stick and refuses IV meds. Will give Toradol IM and PO Benadryl and Reglan. Work note provided. Opportunity for questions provided and all questions answered. Return precautions given.     Bethel BornKelly Marie Gekas, PA-C 08/08/16 [phone]    Bethann BerkshireJoseph Zammit, MD 08/08/16 [phone]

## 2016-08-08 NOTE — ED Notes (Signed)
Patient and wife upset that they have to be in hall and threatened to leave.  EDP and charge nurse aware.  Will be placed in next available room in Pod. E.

## 2016-08-08 NOTE — ED Notes (Signed)
Pt back from MRI 

## 2016-08-08 NOTE — Discharge Instructions (Signed)
Read the information below.  You may return to the Emergency Department at any time for worsening condition or any new symptoms that concern you.   Please discontinue your glipizide.  Track your blood sugars closely and call your primary care provider to discuss your medications.

## 2016-08-08 NOTE — Telephone Encounter (Signed)
Patient Name: Jonathon DowdyORIN Plouffe DOB: Aug 08, 1965 Initial Comment Caller states yesterday he started having double vision, and pain in his right shoulder. Nurse Assessment Nurse: Yetta BarreJones, RN, Miranda Date/Time (Eastern Time): 08/08/2016 10:41:58 AM Confirm and document reason for call. If symptomatic, describe symptoms. ---Caller states he started having double vision yesterday. He has also had right shoulder pain for 1-2 weeks. Does the patient have any new or worsening symptoms? ---Yes Will a triage be completed? ---Yes Related visit to physician within the last 2 weeks? ---No Does the PT have any chronic conditions? (i.e. diabetes, asthma, etc.) ---Yes List chronic conditions. ---Diabetes Is this a behavioral health or substance abuse call? ---No Guidelines Guideline Title Affirmed Question Affirmed Notes Vision Loss or Change Double vision Final Disposition User Go to ED Now (or PCP triage) Yetta BarreJones, RN, Miranda Comments No appt available with PCP or at primary office. [facility] He would not be going to the ED any sooner than that. Referrals GO TO FACILITY OTHER - SPECIFY Disagree/Comply: Comply

## 2016-08-08 NOTE — ED Triage Notes (Signed)
H/a, double visiion rt arm pain  Since yesterday tingling in fingers x 2 weeks,

## 2016-08-08 NOTE — ED Notes (Signed)
In MRI

## 2016-08-10 ENCOUNTER — Other Ambulatory Visit: Payer: Self-pay | Admitting: Physician Assistant

## 2016-08-17 ENCOUNTER — Ambulatory Visit (INDEPENDENT_AMBULATORY_CARE_PROVIDER_SITE_OTHER): Payer: BLUE CROSS/BLUE SHIELD | Admitting: Family Medicine

## 2016-08-17 ENCOUNTER — Encounter: Payer: Self-pay | Admitting: Family Medicine

## 2016-08-17 VITALS — BP 116/74 | HR 81 | Temp 98.0°F | Ht 73.0 in | Wt 285.8 lb

## 2016-08-17 DIAGNOSIS — E785 Hyperlipidemia, unspecified: Secondary | ICD-10-CM

## 2016-08-17 DIAGNOSIS — M25562 Pain in left knee: Secondary | ICD-10-CM

## 2016-08-17 DIAGNOSIS — E1140 Type 2 diabetes mellitus with diabetic neuropathy, unspecified: Secondary | ICD-10-CM

## 2016-08-17 DIAGNOSIS — H4921 Sixth [abducent] nerve palsy, right eye: Secondary | ICD-10-CM | POA: Insufficient documentation

## 2016-08-17 DIAGNOSIS — Z6835 Body mass index (BMI) 35.0-35.9, adult: Secondary | ICD-10-CM

## 2016-08-17 DIAGNOSIS — I10 Essential (primary) hypertension: Secondary | ICD-10-CM

## 2016-08-17 DIAGNOSIS — IMO0002 Reserved for concepts with insufficient information to code with codable children: Secondary | ICD-10-CM

## 2016-08-17 DIAGNOSIS — G8929 Other chronic pain: Secondary | ICD-10-CM

## 2016-08-17 DIAGNOSIS — B353 Tinea pedis: Secondary | ICD-10-CM | POA: Insufficient documentation

## 2016-08-17 DIAGNOSIS — E1165 Type 2 diabetes mellitus with hyperglycemia: Secondary | ICD-10-CM | POA: Diagnosis not present

## 2016-08-17 DIAGNOSIS — E1142 Type 2 diabetes mellitus with diabetic polyneuropathy: Secondary | ICD-10-CM

## 2016-08-17 MED ORDER — ATORVASTATIN CALCIUM 10 MG PO TABS: 10.0000 mg | ORAL_TABLET | Freq: Every day | ORAL | 3 refills | Status: DC

## 2016-08-17 MED ORDER — METFORMIN HCL 500 MG PO TABS: 500.0000 mg | ORAL_TABLET | Freq: Two times a day (BID) | ORAL | 3 refills | Status: DC

## 2016-08-17 MED ORDER — TRAMADOL HCL 50 MG PO TABS: 50.0000 mg | ORAL_TABLET | Freq: Two times a day (BID) | ORAL | 0 refills | Status: DC | PRN

## 2016-08-17 MED ORDER — INSULIN PEN NEEDLE 32G X 6 MM MISC: 1.0000 [IU] | 3 refills | Status: DC

## 2016-08-17 MED ORDER — LISINOPRIL 10 MG PO TABS: 10.0000 mg | ORAL_TABLET | Freq: Every day | ORAL | 3 refills | Status: DC

## 2016-08-17 MED ORDER — INSULIN GLARGINE 100 UNIT/ML SOLOSTAR PEN: 20.0000 [IU] | PEN_INJECTOR | Freq: Every day | SUBCUTANEOUS | 3 refills | Status: DC

## 2016-08-17 MED ORDER — GABAPENTIN 300 MG PO CAPS: 300.0000 mg | ORAL_CAPSULE | Freq: Two times a day (BID) | ORAL | 3 refills | Status: DC

## 2016-08-17 NOTE — Assessment & Plan Note (Signed)
Chronic, stable. Refilled lisinopril 10mg  daily.

## 2016-08-17 NOTE — Progress Notes (Signed)
BP 116/74   Pulse 81   Temp 98 F (36.7 C) (Oral)   Ht 6\' 1"  (1.854 m)   Wt 285 lb 12.8 oz (129.6 kg)   SpO2 98%   BMI 37.71 kg/m    CC: ER f/u visit Subjective:    Patient ID: Jonathon [name], male    DOB: [birth year], 51 y.o.   MRN: [id number]  HPI: Jonathon [name] is a 51 y.o. male presenting on 08/17/2016 for Knee Pain (left knee) and Medication Management   Here with fiancee.   Recent ER visit with sharp R sided headache, diplopia and chest pain. Records reviewed. Chest pain was dull and right sided, at rest without exertion. He endorsed AM hypoglycemia as well with sweating and nausea. Neurology was consulted, MR brain obtained with no acute finding. Treated with toradol IM, benadryl and reglan. Cardiac enzymes and workup was also negative. CXR, head CT was normal.   He saw eye doctor Dr Druscilla Brownie - dx with 6th nerve palsy. Given eye patch. F/u planned in 1 month.   50 lb intentional weight loss over the past several years endorsed. Last seen here 07/2014, at that time weighed 306lbs. When last seen, was on metformin 1000mg  bid, amaryl 8mg  at night, and lantus 15u nightly. Lost insurance so unable to return for f/u. [facility] Now with new job has new insurance. Current medication regimen - taking lantus 35u nightly and metformin 500mg  bid. Since hospitalization, he has stopped sulfonylurea and decreased lantus 20 units daily.  Lab Results  Component Value Date   HGBA1C 12.3 (H) 07/30/2014   Requests med refills.  Would like to discuss ongoing L knee pain - treats with tramadol and ibuprofen. Last xray 2015, unrevealing. Has knee brace from [place] but bulky so doesn't use. Has not tried physical therapy for this.   Relevant past medical, surgical, family and social history reviewed and updated as indicated. Interim medical history since our last visit reviewed. Allergies and medications reviewed and updated. Current Outpatient Prescriptions on File Prior to Visit    Medication Sig  . glucose blood (ONE TOUCH ULTRA TEST) test strip Check blood sugar twice a day and as instructed. Dx 250.00  . ibuprofen (ADVIL,MOTRIN) 600 MG tablet Take 1 tablet (600 mg total) by mouth every 8 (eight) hours as needed.  . lidocaine (LIDODERM) 5 % Place 1 patch onto the skin every 12 (twelve) hours. Remove & Discard patch within 12 hours or as directed by MD   No current facility-administered medications on file prior to visit.     Review of Systems Per HPI unless specifically indicated in ROS section     Objective:    BP 116/74   Pulse 81   Temp 98 F (36.7 C) (Oral)   Ht 6\' 1"  (1.854 m)   Wt 285 lb 12.8 oz (129.6 kg)   SpO2 98%   BMI 37.71 kg/m   Wt Readings from Last 3 Encounters:  08/17/16 285 lb 12.8 oz (129.6 kg)  04/03/16 299 lb (135.6 kg)  12/29/15 282 lb (127.9 kg)    Physical Exam  Constitutional: He appears well-developed and well-nourished. No distress.  HENT:  Head: Normocephalic and atraumatic.  Mouth/Throat: Oropharynx is clear and moist. No oropharyngeal exudate.  Eyes: Conjunctivae and EOM are normal. Pupils are equal, round, and reactive to light. No scleral icterus.  Difficulty with R eye lateral gaze with compensatory right face turn present  Neck: Normal range of motion. Neck supple.  Cardiovascular: Normal rate, regular rhythm, normal heart sounds and intact distal pulses.   No murmur heard. Pulmonary/Chest: Effort normal and breath sounds normal. No respiratory distress. He has no wheezes. He has no rales.  Musculoskeletal: He exhibits edema (tr).  Wearing compression stockings See HPI for foot exam if done R knee WNL L Knee exam: No deformity on inspection. Mild discomfort with palpation of medial and anterior knee  No effusion/swelling noted. FROM in flex/extension without crepitus, some pain with full extension. No popliteal fullness. Neg drawer test. Neg mcmurray test. No pain with valgus/varus stress. + PF grind No  abnormal patellar mobility.   Lymphadenopathy:    He has no cervical adenopathy.  Skin: Skin is warm and dry. No rash noted.  Psychiatric: He has a normal mood and affect.  Nursing note and vitals reviewed.  Results for orders placed or performed during the hospital encounter of 08/08/16  Basic metabolic panel  Result Value Ref Range   Sodium 140 135 - 145 mmol/L   Potassium 4.1 3.5 - 5.1 mmol/L   Chloride 108 101 - 111 mmol/L   CO2 26 22 - 32 mmol/L   Glucose, Bld 166 (H) 65 - 99 mg/dL   BUN 12 6 - 20 mg/dL   Creatinine, Ser 2.13 0.61 - 1.24 mg/dL   Calcium 9.5 8.9 - 08.6 mg/dL   GFR calc non Af Amer >60 >60 mL/min   GFR calc Af Amer >60 >60 mL/min   Anion gap 6 5 - 15  CBC  Result Value Ref Range   WBC 5.7 4.0 - 10.5 K/uL   RBC 5.01 4.22 - 5.81 MIL/uL   Hemoglobin 15.2 13.0 - 17.0 g/dL   HCT 57.8 46.9 - 62.9 %   MCV 89.0 78.0 - 100.0 fL   MCH 30.3 26.0 - 34.0 pg   MCHC 34.1 30.0 - 36.0 g/dL   RDW 52.8 41.3 - 24.4 %   Platelets 271 150 - 400 K/uL  I-stat troponin, ED  Result Value Ref Range   Troponin i, poc 0.01 0.00 - 0.08 ng/mL   Comment 3          I-stat troponin, ED  Result Value Ref Range   Troponin i, poc 0.00 0.00 - 0.08 ng/mL   Comment 3              Assessment & Plan:   Problem List Items Addressed This Visit    Abducens nerve palsy, right    CN 6 palsy by ophtho, planned f/u next month. With associated headache. Likely uncontrolled DM contributes. Appreciate Dr Gerome Sam care.       Relevant Medications   gabapentin (NEURONTIN) 300 MG capsule   Diabetic neuropathy (HCC)    Improved with better sugar control. Refilled gabapentin 300mg  bid.       Relevant Medications   atorvastatin (LIPITOR) 10 MG tablet   lisinopril (PRINIVIL,ZESTRIL) 10 MG tablet   metFORMIN (GLUCOPHAGE) 500 MG tablet   Insulin Glargine (LANTUS SOLOSTAR) 100 UNIT/ML Solostar Pen   HLD (hyperlipidemia)    Chronic. Reports compliance with lipitor but he has stopped tricor. Will  continue lipitor 10mg  daily, check dLDL today and full FLP next visit.       Relevant Medications   atorvastatin (LIPITOR) 10 MG tablet   lisinopril (PRINIVIL,ZESTRIL) 10 MG tablet   Other Relevant Orders   LDL Cholesterol, Direct   TSH   Hypertension, essential    Chronic, stable. Refilled lisinopril 10mg  daily.  Relevant Medications   atorvastatin (LIPITOR) 10 MG tablet   lisinopril (PRINIVIL,ZESTRIL) 10 MG tablet   Knee pain, left    Ongoing pain - anticipate arthritis + PFPS.  rec knee brace, elevation, ice. Will treat with tramadol PRN #40 printed out today.       Severe obesity (BMI 35.0-35.9 with comorbidity) (HCC)    Congratulated on weight loss to date - will continue working towards ongoing weight loss through healthy diet and lifestyle changes.       Relevant Medications   metFORMIN (GLUCOPHAGE) 500 MG tablet   Insulin Glargine (LANTUS SOLOSTAR) 100 UNIT/ML Solostar Pen   Tinea pedis    L maceration between toes R mild moccasin type tinea Discussed good foot care. Recommended lotrimin cream.       Uncontrolled type 2 diabetes with neuropathy (HCC) - Primary    Lost to f/u, [facility], L[facility]us [facility] to 35u. Since then has had healthy diet and lifestyle changes with noted weight loss, less eating out and healthier cooking by fiancee.  Congratulated on healthy diet and lifestyle changes. Now with hypoglycemia. Sulfonylurea has been stopped, lantus has been decreased to 20u daily. Foot exam today.  meds refilled today.  RTC 3 mo f/u visit.       Relevant Medications   atorvastatin (LIPITOR) 10 MG tablet   lisinopril (PRINIVIL,ZESTRIL) 10 MG tablet   metFORMIN (GLUCOPHAGE) 500 MG tablet   Insulin Glargine (LANTUS SOLOSTAR) 100 UNIT/ML Solostar Pen   Other Relevant Orders   Hemoglobin A1c       Follow up plan: Return in about 3 months (around 11/14/2016) for follow up visit.  Eustaquio BoydenJavier Katelen Luepke, MD

## 2016-08-17 NOTE — Assessment & Plan Note (Signed)
Congratulated on weight loss to date - will continue working towards ongoing weight loss through healthy diet and lifestyle changes.

## 2016-08-17 NOTE — Assessment & Plan Note (Addendum)
CN 6 palsy by ophtho, planned f/u next month. With associated headache. Likely uncontrolled DM contributes. Appreciate Dr Gerome SamPorfilio's care.

## 2016-08-17 NOTE — Assessment & Plan Note (Addendum)
Lost to f/u, [facility], Lantus increased to 35u. Since then has had healthy diet and lifestyle changes with noted weight loss, less eating out and healthier cooking by fiancee.  Congratulated on healthy diet and lifestyle changes. Now with hypoglycemia. Sulfonylurea has been stopped, lantus has been decreased to 20u daily. Foot exam today.  meds refilled today.  RTC 3 mo f/u visit.

## 2016-08-17 NOTE — Assessment & Plan Note (Signed)
Ongoing pain - anticipate arthritis + PFPS.  rec knee brace, elevation, ice. Will treat with tramadol PRN #40 printed out today.

## 2016-08-17 NOTE — Progress Notes (Signed)
Pre visit review using our clinic review tool, if applicable. No additional management support is needed unless otherwise documented below in the visit note. 

## 2016-08-17 NOTE — Assessment & Plan Note (Signed)
L maceration between toes R mild moccasin type tinea Discussed good foot care. Recommended lotrimin cream.

## 2016-08-17 NOTE — Patient Instructions (Addendum)
Medicines refilled. Labs today.  Continue lower lantus dose. Pen sent to pharmacy.  I think you have patellofemoral pain of knee along with some arthritis changes. Treat with tramadol as needed, elevation of leg, ice, and knee sleeve brace.  Return in 3 months for diabetes follow up

## 2016-08-17 NOTE — Assessment & Plan Note (Signed)
Chronic. Reports compliance with lipitor but he has stopped tricor. Will continue lipitor 10mg  daily, check dLDL today and full FLP next visit.

## 2016-08-17 NOTE — Assessment & Plan Note (Signed)
Improved with better sugar control. Refilled gabapentin 300mg  bid.

## 2016-08-18 LAB — HEMOGLOBIN A1C: Hgb A1c MFr Bld: 10.2 % — ABNORMAL HIGH (ref 4.6–6.5)

## 2016-08-18 LAB — TSH: TSH: 0.73 u[IU]/mL (ref 0.35–4.50)

## 2016-08-18 LAB — LDL CHOLESTEROL, DIRECT: Direct LDL: 105 mg/dL

## 2016-09-04 ENCOUNTER — Other Ambulatory Visit: Payer: Self-pay | Admitting: Family Medicine

## 2016-10-17 ENCOUNTER — Other Ambulatory Visit: Payer: Self-pay | Admitting: Family Medicine

## 2016-10-17 NOTE — Telephone Encounter (Signed)
Rx called in as prescribed 

## 2016-10-17 NOTE — Telephone Encounter (Signed)
plz phoen in. 

## 2016-10-17 NOTE — Telephone Encounter (Signed)
f/u scheduled for 11/14/16, last filled on 08/17/16 #40 tabs with 0 refills, please advise

## 2016-10-25 ENCOUNTER — Telehealth: Payer: Self-pay

## 2016-10-25 NOTE — Telephone Encounter (Signed)
Pt left v/m did not get cb on Stendra; I called Amanda at [facility] [facility] and pt has rx for [employer]; Marchelle Folks has to order and should be there 10/26/16.Marchelle Folks was not sure of cost to pt until runs rx tomorrow. Left v/m for pt to cb.

## 2016-10-27 NOTE — Telephone Encounter (Signed)
I called pt and Jonathon [name] is not covered by ins. Pt will contact ins and cb to [facility] at some point with substitute med covered by ins.

## 2016-11-05 ENCOUNTER — Other Ambulatory Visit: Payer: Self-pay | Admitting: Family Medicine

## 2016-11-06 NOTE — Telephone Encounter (Signed)
Received refill electronically Last refill 10/17/16 #40 Last office visit 08/17/16

## 2016-11-06 NOTE — Telephone Encounter (Signed)
plz phone in. 

## 2016-11-07 ENCOUNTER — Emergency Department (HOSPITAL_BASED_OUTPATIENT_CLINIC_OR_DEPARTMENT_OTHER)
Admit: 2016-11-07 | Discharge: 2016-11-07 | Disposition: A | Discharge status: Home / Self Care | Payer: BLUE CROSS/BLUE SHIELD | Attending: Emergency Medicine | Admitting: Emergency Medicine

## 2016-11-07 ENCOUNTER — Emergency Department (HOSPITAL_COMMUNITY)
Admission: EM | Admit: 2016-11-07 | Discharge: 2016-11-07 | Disposition: A | Discharge status: Home / Self Care | Payer: BLUE CROSS/BLUE SHIELD | Attending: Emergency Medicine | Admitting: Emergency Medicine

## 2016-11-07 ENCOUNTER — Encounter (HOSPITAL_COMMUNITY): Payer: Self-pay | Admitting: Emergency Medicine

## 2016-11-07 ENCOUNTER — Emergency Department (HOSPITAL_COMMUNITY): Payer: BLUE CROSS/BLUE SHIELD

## 2016-11-07 DIAGNOSIS — Z79899 Other long term (current) drug therapy: Secondary | ICD-10-CM | POA: Insufficient documentation

## 2016-11-07 DIAGNOSIS — Z794 Long term (current) use of insulin: Secondary | ICD-10-CM | POA: Insufficient documentation

## 2016-11-07 DIAGNOSIS — M79604 Pain in right leg: Secondary | ICD-10-CM | POA: Diagnosis present

## 2016-11-07 DIAGNOSIS — M79609 Pain in unspecified limb: Secondary | ICD-10-CM

## 2016-11-07 DIAGNOSIS — E1140 Type 2 diabetes mellitus with diabetic neuropathy, unspecified: Secondary | ICD-10-CM | POA: Insufficient documentation

## 2016-11-07 DIAGNOSIS — I10 Essential (primary) hypertension: Secondary | ICD-10-CM | POA: Diagnosis not present

## 2016-11-07 DIAGNOSIS — M5431 Sciatica, right side: Secondary | ICD-10-CM | POA: Insufficient documentation

## 2016-11-07 LAB — CBC WITH DIFFERENTIAL/PLATELET
BASOS PCT: 0 %
Basophils Absolute: 0 10*3/uL (ref 0.0–0.1)
EOS ABS: 0.1 10*3/uL (ref 0.0–0.7)
EOS PCT: 2 %
HCT: 41.4 % (ref 39.0–52.0)
Hemoglobin: 14.1 g/dL (ref 13.0–17.0)
LYMPHS ABS: 2.4 10*3/uL (ref 0.7–4.0)
Lymphocytes Relative: 49 %
MCH: 29.9 pg (ref 26.0–34.0)
MCHC: 34.1 g/dL (ref 30.0–36.0)
MCV: 87.9 fL (ref 78.0–100.0)
MONOS PCT: 6 %
Monocytes Absolute: 0.3 10*3/uL (ref 0.1–1.0)
Neutro Abs: 2 10*3/uL (ref 1.7–7.7)
Neutrophils Relative %: 43 %
PLATELETS: 257 10*3/uL (ref 150–400)
RBC: 4.71 MIL/uL (ref 4.22–5.81)
RDW: 13.1 % (ref 11.5–15.5)
WBC: 4.8 10*3/uL (ref 4.0–10.5)

## 2016-11-07 LAB — URINALYSIS, ROUTINE W REFLEX MICROSCOPIC
BILIRUBIN URINE: NEGATIVE
Glucose, UA: NEGATIVE mg/dL
HGB URINE DIPSTICK: NEGATIVE
Ketones, ur: NEGATIVE mg/dL
Leukocytes, UA: NEGATIVE
NITRITE: NEGATIVE
PROTEIN: NEGATIVE mg/dL
Specific Gravity, Urine: 1.019 (ref 1.005–1.030)
pH: 7 (ref 5.0–8.0)

## 2016-11-07 LAB — BASIC METABOLIC PANEL
Anion gap: 7 (ref 5–15)
BUN: 18 mg/dL (ref 6–20)
CALCIUM: 9.2 mg/dL (ref 8.9–10.3)
CHLORIDE: 106 mmol/L (ref 101–111)
CO2: 24 mmol/L (ref 22–32)
CREATININE: 1.09 mg/dL (ref 0.61–1.24)
GFR calc non Af Amer: >60 mL/min (ref >60)
Glucose, Bld: 147 mg/dL — ABNORMAL HIGH (ref 65–99)
Potassium: 4.3 mmol/L (ref 3.5–5.1)
SODIUM: 137 mmol/L (ref 135–145)

## 2016-11-07 MED ORDER — HYDROCODONE-ACETAMINOPHEN 5-325 MG PO TABS: 1.0000 | ORAL_TABLET | ORAL | 0 refills | Status: DC | PRN

## 2016-11-07 MED ORDER — HYDROCODONE-ACETAMINOPHEN 5-325 MG PO TABS
1.0000 | ORAL_TABLET | Freq: Once | ORAL | Status: AC
  Administered 2016-11-07: 1 via ORAL
  Filled 2016-11-07: qty 1

## 2016-11-07 MED ORDER — KETOROLAC TROMETHAMINE 30 MG/ML IJ SOLN
30.0000 mg | Freq: Once | INTRAMUSCULAR | Status: AC
  Administered 2016-11-07: 30 mg via INTRAMUSCULAR
  Filled 2016-11-07: qty 1

## 2016-11-07 MED ORDER — CYCLOBENZAPRINE HCL 10 MG PO TABS: 10.0000 mg | ORAL_TABLET | Freq: Two times a day (BID) | ORAL | 0 refills | Status: DC | PRN

## 2016-11-07 NOTE — Telephone Encounter (Signed)
Rx called in as directed.   

## 2016-11-07 NOTE — ED Triage Notes (Signed)
Pt reports 10/10 pain that starts in his right groin and radiates into his right knee and into his shin. Pt states this started 2 weeks ago. Denies any redness of swelling in leg.

## 2016-11-07 NOTE — ED Notes (Signed)
Patient transported to Ultrasound 

## 2016-11-07 NOTE — ED Notes (Signed)
ED Provider at bedside. 

## 2016-11-07 NOTE — ED Notes (Signed)
Pt returned to room from xray.

## 2016-11-07 NOTE — ED Provider Notes (Signed)
[facility]-EMERGENCY DEPT Provider Note   CSN: [id number] Arrival date & time: 11/07/16  4098     History   Chief Complaint Chief Complaint  Patient presents with  . Leg Pain    HPI Jonathon [name] is a 51 y.o. male.  Pt presents to the ED today for pain to his right leg.  He said it's been going on for about 1 month.  He has not seen his doctor for his sx, but has an appt tomorrow at the [place].  Pt said that he has pain in his groin radiating into his right shin and knee.  He is able to ambulate, but limps.  He is a diabetic and said his blood sugar has been excellent.  He has lost a large amount of weight and is trying to exercise and to eat better.      Past Medical History:  Diagnosis Date  . ED (erectile dysfunction) of organic origin   . Elevated PSA 2012   with L lobe induration on DRE, normal biopsy 2012 ([place])  . Family history of prostate cancer    father, brother  . Genital herpes   . History of chronic prostatitis 2012   granulomatous, CXR done - negative  . History of hypertension   . HLD (hyperlipidemia)   . Morbid obesity (HCC)   . Seasonal allergies   . T2DM (type 2 diabetes mellitus) (HCC) 2010   lantus started by [facility]    Patient Active Problem List   Diagnosis Date Noted  . 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 Tinea pedis 08/17/2016  . Knee pain, left 05/10/2014  . Exposure to toxic chemical 03/04/2014  . Diabetic neuropathy (HCC) 03/04/2014  . Genital herpes   . Uncontrolled type 2 diabetes with neuropathy (HCC)   . Hypertension, essential   . HLD (hyperlipidemia)   . Severe obesity (BMI 35.0-35.9 with comorbidity) (HCC)   . ED (erectile dysfunction) of organic origin   . Family history of prostate cancer     Past Surgical History:  Procedure Laterality Date  . ETT  2012   WNL ([facility])  . I&D EXTREMITY Right 12/03/2012   Procedure: IRRIGATION AND DEBRIDEMENT RIGHT HAND ABSCESS ;  Surgeon: Tami Ribas, MD;  Location: [facility];  Service: Orthopedics;  Laterality: Right;  . PROSTATE BIOPSY  2012   granulomatous prostatitis       Home Medications    Prior to Admission medications   Medication Sig Start Date End Date Taking? Authorizing Provider  atorvastatin (LIPITOR) 10 MG tablet Take 1 tablet (10 mg total) by mouth daily at 6 PM. 08/17/16   [name], MD  cyclobenzaprine (FLEXERIL) 10 MG tablet Take 1 tablet (10 mg total) by mouth 2 (two) times daily as needed for muscle spasms. 11/07/16   Jacalyn Lefevre, MD  gabapentin (NEURONTIN) 300 MG capsule Take 1 capsule (300 mg total) by mouth 2 (two) times daily. 08/17/16   [name], MD  glucose blood (ONE TOUCH ULTRA TEST) test strip Check blood sugar twice a day and as instructed. Dx 250.00 07/14/13   [name], MD  HYDROcodone-acetaminophen (NORCO/VICODIN) 5-325 MG tablet Take 1 tablet by mouth every 4 (four) hours as needed. 11/07/16   Jacalyn Lefevre, MD  ibuprofen (ADVIL,MOTRIN) 600 MG tablet Take 1 tablet (600 mg total) by mouth every 8 (eight) hours as needed. 12/25/15   Joni Reining, PA-C  Insulin Glargine (LANTUS SOLOSTAR) 100 UNIT/ML Solostar Pen Inject 20 Units into  the skin daily at 10 pm. 08/17/16   [name], MD  Insulin Pen Needle 32G X 6 MM MISC 1 Units by Does not apply route as directed. 08/17/16   [name], MD  lidocaine (LIDODERM) 5 % Place 1 patch onto the skin every 12 (twelve) hours. Remove & Discard patch within 12 hours or as directed by MD 04/04/16 04/04/17  Rebecka Apley, MD  lisinopril (PRINIVIL,ZESTRIL) 10 MG tablet Take 1 tablet (10 mg total) by mouth daily. 08/17/16   [name], MD  metFORMIN (GLUCOPHAGE) 500 MG tablet Take 1 tablet (500 mg total) by mouth 2 (two) times daily with a meal. 08/17/16   [name], MD  STENDRA 100 MG TABS TAKE 1/2 - 1 TABLET (50-100 MG) BY MOUTH DAILY AS NEEDED. 09/06/16   [name], MD  traMADol (ULTRAM) 50 MG tablet TAKE 1 TABLET BY MOUTH TWICE A DAY AS NEEDED  MODERATE PAIN 11/06/16   [name], MD    Family History Family History  Problem Relation Age of Onset  . Cancer Father 83    colon, prostate  . Hypertension Father   . Diabetes Father   . Stroke Father 2  . Coronary artery disease Father 28    MI  . Cancer Brother 10    prostate  . Diabetes Mother   . Diabetes Sister     Social History Social History  Substance Use Topics  . Smoking status: Never Smoker  . Smokeless tobacco: Never Used  . Alcohol use Yes     Comment: occasional     Allergies   Patient has no known allergies.   Review of Systems Review of Systems  Musculoskeletal:       Right knee, right leg pain  All other systems reviewed and are negative.    Physical Exam Updated Vital Signs BP (!) 127/91 (BP Location: Right Arm)   Pulse 82   Temp 98 F (36.7 C) (Oral)   Resp 20   SpO2 100%   Physical Exam  Constitutional: He appears well-developed and well-nourished.  HENT:  Head: Normocephalic and atraumatic.  Right Ear: External ear normal.  Left Ear: External ear normal.  Nose: Nose normal.  Mouth/Throat: Oropharynx is clear and moist.  Eyes: Conjunctivae and EOM are normal. Pupils are equal, round, and reactive to light.  Neck: Normal range of motion. Neck supple.  Cardiovascular: Normal rate, regular rhythm, normal heart sounds and intact distal pulses.   Pulmonary/Chest: Effort normal and breath sounds normal.  Abdominal: Soft. Bowel sounds are normal.  Musculoskeletal: Normal range of motion.       Legs: Neurological: He is alert.  Skin: Skin is warm and dry.  Psychiatric: He has a normal mood and affect. His behavior is normal. Judgment and thought content normal.  Nursing note and vitals reviewed.    ED Treatments / Results  Labs (all labs ordered are listed, but only abnormal results are displayed) Labs Reviewed  BASIC METABOLIC PANEL - Abnormal; Notable for the following:       Result Value   Glucose, Bld 147 (*)     All other components within normal limits  CBC WITH DIFFERENTIAL/PLATELET  URINALYSIS, ROUTINE W REFLEX MICROSCOPIC    EKG  EKG Interpretation None       Radiology Dg Lumbar Spine Complete  Result Date: 11/07/2016 CLINICAL DATA:  Right lower extremity and right hip pain for 1 week, no known injury EXAM: LUMBAR SPINE - COMPLETE 4+ VIEW COMPARISON:  CT  abdomen pelvis of 04/04/2016 FINDINGS: The lumbar vertebrae are in normal alignment. Intervertebral disc spaces appear normal. No compression deformity is seen. The facet joints are unremarkable on oblique views. The SI joints appear corticated. IMPRESSION: Normal alignment.  Normal intervertebral disc spaces. Electronically Signed   By: Dwyane Dee M.D.   On: 11/07/2016 10:16   Dg Knee Complete 4 Views Right  Result Date: 11/07/2016 CLINICAL DATA:  Right lower extremity and hip pain for 1 week, no injury EXAM: RIGHT KNEE - COMPLETE 4+ VIEW COMPARISON:  None. FINDINGS: The hip joint spaces are relatively well preserved for age with perhaps minimal loss of lateral compartment joint space. No fracture is seen. No joint effusion is noted. The patella appears normally positioned. IMPRESSION: No acute abnormality. Very minimal loss of lateral compartment joint space. Electronically Signed   By: Dwyane Dee M.D.   On: 11/07/2016 10:15   Dg Hip Unilat W Or Wo Pelvis 2-3 Views Right  Result Date: 11/07/2016 CLINICAL DATA:  Right lower extremity and right hip pain for 1 week, no injury EXAM: DG HIP (WITH OR WITHOUT PELVIS) 2-3V RIGHT COMPARISON:  None. FINDINGS: The hip joint spaces appear relatively normal for age with minimal degenerative change. No fracture is seen. The pelvic rami are intact. The SI joints are corticated. IMPRESSION: No acute abnormality.  Minimal degenerative change of the hips. Electronically Signed   By: Dwyane Dee M.D.   On: 11/07/2016 10:14   Preliminary results by tech - Right Lower Ext. Venous Duplex Completed. Negative for  deep and superficial vein thrombosis in the right leg.  Rita Sturdivant, BS, RDMS, RVT   Procedures Procedures (including critical care time)  Medications Ordered in ED Medications  HYDROcodone-acetaminophen (NORCO/VICODIN) 5-325 MG per tablet 1 tablet (not administered)  ketorolac (TORADOL) 30 MG/ML injection 30 mg (30 mg Intramuscular Given 11/07/16 0931)     Initial Impression / Assessment and Plan / ED Course  I have reviewed the triage vital signs and the nursing notes.  Pertinent labs & imaging results that were available during my care of the patient were reviewed by me and considered in my medical decision making (see chart for details).    Pt likely has sciatica.  He wants an MRI now, but I told him that needs to be ordered by his doctor on a non emergent basis.  He does have an appt with his doctor tomorrow.  He is encouraged to tell his pcp about his sx.  Final Clinical Impressions(s) / ED Diagnoses   Final diagnoses:  Sciatica of right side    New Prescriptions New Prescriptions   CYCLOBENZAPRINE (FLEXERIL) 10 MG TABLET    Take 1 tablet (10 mg total) by mouth 2 (two) times daily as needed for muscle spasms.   HYDROCODONE-ACETAMINOPHEN (NORCO/VICODIN) 5-325 MG TABLET    Take 1 tablet by mouth every 4 (four) hours as needed.     Jacalyn Lefevre, MD 11/07/16 [phone]

## 2016-11-07 NOTE — ED Notes (Signed)
Pt returns from ultrasound. On monitor.

## 2016-11-07 NOTE — Progress Notes (Signed)
Preliminary results by tech - Right Lower Ext. Venous Duplex Completed. Negative for deep and superficial vein thrombosis in the right leg.  Bolton Canupp, BS, RDMS, RVT  

## 2016-11-14 ENCOUNTER — Ambulatory Visit: Payer: Non-veteran care | Admitting: Family Medicine

## 2016-11-15 ENCOUNTER — Encounter: Payer: Self-pay | Admitting: Family Medicine

## 2016-11-15 ENCOUNTER — Ambulatory Visit (INDEPENDENT_AMBULATORY_CARE_PROVIDER_SITE_OTHER): Payer: BLUE CROSS/BLUE SHIELD | Admitting: Family Medicine

## 2016-11-15 VITALS — BP 108/88 | HR 95 | Wt 281.0 lb

## 2016-11-15 DIAGNOSIS — E1165 Type 2 diabetes mellitus with hyperglycemia: Secondary | ICD-10-CM | POA: Diagnosis not present

## 2016-11-15 DIAGNOSIS — G8929 Other chronic pain: Secondary | ICD-10-CM

## 2016-11-15 DIAGNOSIS — I10 Essential (primary) hypertension: Secondary | ICD-10-CM | POA: Diagnosis not present

## 2016-11-15 DIAGNOSIS — E1140 Type 2 diabetes mellitus with diabetic neuropathy, unspecified: Secondary | ICD-10-CM

## 2016-11-15 DIAGNOSIS — M5431 Sciatica, right side: Secondary | ICD-10-CM | POA: Insufficient documentation

## 2016-11-15 DIAGNOSIS — M25562 Pain in left knee: Secondary | ICD-10-CM | POA: Diagnosis not present

## 2016-11-15 DIAGNOSIS — Z6835 Body mass index (BMI) 35.0-35.9, adult: Secondary | ICD-10-CM | POA: Diagnosis not present

## 2016-11-15 DIAGNOSIS — E785 Hyperlipidemia, unspecified: Secondary | ICD-10-CM

## 2016-11-15 DIAGNOSIS — N529 Male erectile dysfunction, unspecified: Secondary | ICD-10-CM | POA: Diagnosis not present

## 2016-11-15 DIAGNOSIS — IMO0002 Reserved for concepts with insufficient information to code with codable children: Secondary | ICD-10-CM

## 2016-11-15 LAB — HEMOGLOBIN A1C: HEMOGLOBIN A1C: 7.2 % — AB (ref 4.6–6.5)

## 2016-11-15 MED ORDER — AVANAFIL 100 MG PO TABS: ORAL_TABLET | ORAL | 1 refills | Status: DC

## 2016-11-15 NOTE — Assessment & Plan Note (Addendum)
Ongoing. Discussed tramadol use. rec against hydrocodone.

## 2016-11-15 NOTE — Assessment & Plan Note (Signed)
Congratulated on ongoing weight loss. Continues working on [employer] and lifestyle changes.

## 2016-11-15 NOTE — Assessment & Plan Note (Signed)
Chronic, stable. Continue lisinopril.  

## 2016-11-15 NOTE — Progress Notes (Signed)
BP 108/88   Pulse 95   Wt 281 lb 0.1 oz (127.5 kg)   SpO2 99%   BMI 37.07 kg/m    CC: f/u visit Subjective:    Patient ID: Jonathon [name], male    DOB: 1966/03/18, 51 y.o.   MRN: [id number]  HPI: Jonathon [name] is a 51 y.o. male presenting on 11/15/2016 for Follow-up   See prior note for details. 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 Recent ER visit for R sciatica. Venous dopplers negative for DVT. Treated with hydrocodone and flexeril. Lumbar films also obtained. [facility].   DM - states last A1c was 8% 2 months ago at [place]. Regularly does check sugars fasting 79-88. He is more regular with sugar checks. Lantus was stopped due to hypoglycemia by [facility]. Compliant with antihyperglycemic regimen which includes: metformin  bid and glipizide  bid. Last diabetic eye exam: saw Porfilio 07/2016.  Pneumovax: declines.  Prevnar: not due.  Lab Results  Component Value Date   HGBA1C 10.2 (H) 08/17/2016   Diabetic Foot Exam - Simple   No data filed     Seen by [place] Q3 months as well.   Relevant past medical, surgical, family and social history reviewed and updated as indicated. Interim medical history since our last visit reviewed. Allergies and medications reviewed and updated. Outpatient Medications Prior to Visit  Medication Sig Dispense Refill  . atorvastatin (LIPITOR) 10 MG tablet Take 1 tablet (10 mg total) by mouth daily at 6 PM. 90 tablet 3  . cyclobenzaprine (FLEXERIL) 10 MG tablet Take 1 tablet (10 mg total) by mouth 2 (two) times daily as needed for muscle spasms. 20 tablet 0  . gabapentin (NEURONTIN) 300 MG capsule Take 1 capsule (300 mg total) by mouth 2 (two) times daily. 180 capsule 3  . glucose blood (ONE TOUCH ULTRA TEST) test strip Check blood sugar twice a day and as instructed. Dx 250.00 100 each 3  . Insulin Pen Needle 32G X 6 MM MISC 1 Units by Does not apply route as directed. 90 each 3  . lidocaine (LIDODERM) 5 % Place 1  patch onto the skin every 12 (twelve) hours. Remove & Discard patch within 12 hours or as directed by MD 10 patch 0  . lisinopril (PRINIVIL,ZESTRIL) 10 MG tablet Take 1 tablet (10 mg total) by mouth daily. 90 tablet 3  . metFORMIN (GLUCOPHAGE) 500 MG tablet Take 1 tablet (500 mg total) by mouth 2 (two) times daily with a meal. 180 tablet 3  . traMADol (ULTRAM) 50 MG tablet TAKE 1 TABLET BY MOUTH TWICE A DAY AS NEEDED MODERATE PAIN 40 tablet 0  . HYDROcodone-acetaminophen (NORCO/VICODIN) 5-325 MG tablet Take 1 tablet by mouth every 4 (four) hours as needed. 10 tablet 0  . ibuprofen (ADVIL,MOTRIN) 600 MG tablet Take 1 tablet (600 mg total) by mouth every 8 (eight) hours as needed. 15 tablet 0  . Insulin Glargine (LANTUS SOLOSTAR) 100 UNIT/ML Solostar Pen Inject 20 Units into the skin daily at 10 pm. 6 pen 3  . STENDRA 100 MG TABS TAKE 1/2 - 1 TABLET (50-100 MG) BY MOUTH DAILY AS NEEDED. 10 tablet 1   No facility-administered medications prior to visit.      Per HPI unless specifically indicated in ROS section below Review of Systems     Objective:    BP 108/88   Pulse 95   Wt 281 lb 0.1 oz (127.5 kg)  SpO2 99%   BMI 37.07 kg/m   Wt Readings from Last 3 Encounters:  11/15/16 281 lb 0.1 oz (127.5 kg)  08/17/16 285 lb 12.8 oz (129.6 kg)  04/03/16 299 lb (135.6 kg)    Physical Exam  Constitutional: He appears well-developed and well-nourished. No distress.  HENT:  Mouth/Throat: Oropharynx is clear and moist. No oropharyngeal exudate.  Eyes: Conjunctivae are normal. Pupils are equal, round, and reactive to light.  Cardiovascular: Normal rate, regular rhythm, normal heart sounds and intact distal pulses.   No murmur heard. Pulmonary/Chest: Effort normal and breath sounds normal. No respiratory distress. He has no wheezes. He has no rales.  Musculoskeletal: He exhibits no edema.  No pain midline spine R lower lumbar paraspinous mm tenderness  Neg SLR bilaterally. No pain with int/ext  rotation at hip.  Skin: Skin is warm and dry. No rash noted.  Psychiatric: He has a normal mood and affect.  Nursing note and vitals reviewed.  Results for orders placed or performed in visit on 11/15/16  HM DIABETES EYE EXAM  Result Value Ref Range   HM Diabetic Eye Exam No Retinopathy No Retinopathy   Lab Results  Component Value Date   CHOL 232 (H) 03/04/2014   HDL 30.70 (L) 03/04/2014   LDLCALC 90 11/20/2012   LDLDIRECT 105.0 08/17/2016   TRIG (H) 03/04/2014    550.0 Triglyceride is over 400; calculations on Lipids are invalid.   CHOLHDL 8 03/04/2014       Assessment & Plan:   Problem List Items Addressed This Visit    ED (erectile dysfunction) of organic origin    stendra has been most effective but very expensive.  rec check with goodRx website for pricing. Rx printed today.       HLD (hyperlipidemia)    Chronic, continues lipitor  daily. Endorses better control on recent [place] labs. I asked him to bring me copy.       Relevant Medications   Avanafil (STENDRA) 100 MG TABS   Hypertension, essential    Chronic, stable. Continue lisinopril.       Relevant Medications   Avanafil (STENDRA) 100 MG TABS   Knee pain, left    Ongoing. Discussed tramadol use. rec against hydrocodone.       Right sided sciatica    rec tramadol, restart meloxicam. Exercises provided today for piriformis syndrom. rec against prednisone, rec against hydrocodone.       Severe obesity (BMI 35.0-35.9 with comorbidity) (HCC)    Congratulated on ongoing weight loss. Continues working on [employer] and lifestyle changes.       Relevant Medications   glipiZIDE (GLUCOTROL) 10 MG tablet   Uncontrolled type 2 diabetes with neuropathy (HCC) - Primary    Pt endorses better control. [facility] regimen to glipizide and metformin (now off lantus). Will update A1c today.       Relevant Medications   glipiZIDE (GLUCOTROL) 10 MG tablet   Other Relevant Orders   Hemoglobin A1c        Follow up plan: Return in about 6 months (around 05/18/2017) for annual exam, prior fasting for blood work.  [name], MD

## 2016-11-15 NOTE — Assessment & Plan Note (Signed)
Pt endorses better control. [facility] has changed diabetic regimen to glipizide and metformin (now off lantus). Will update A1c today.

## 2016-11-15 NOTE — Progress Notes (Signed)
Pre visit review using our clinic review tool, if applicable. No additional management support is needed unless otherwise documented below in the visit note. 

## 2016-11-15 NOTE — Patient Instructions (Addendum)
Jerral Ralph printed out - price out at [employer] on goodrx.com Restart meloxicam. Try tramadol 2 tablets at bedtime and update me with effect. Do exercises for sciatica provided today every morning.  Keep up good sugar control - continue monitoring.  Return in 6 months for physical.  [facility] to [facility].

## 2016-11-15 NOTE — Assessment & Plan Note (Signed)
rec tramadol, restart meloxicam. Exercises provided today for piriformis syndrom. rec against prednisone, rec against hydrocodone.

## 2016-11-15 NOTE — Assessment & Plan Note (Signed)
Chronic, continues lipitor  daily. Endorses better control on recent [place] labs. I asked him to bring me copy.

## 2016-11-15 NOTE — Assessment & Plan Note (Signed)
stendra has been most effective but very expensive.  rec check with goodRx website for pricing. Rx printed today.

## 2016-11-24 ENCOUNTER — Other Ambulatory Visit: Payer: Self-pay | Admitting: Family Medicine

## 2016-11-24 NOTE — Telephone Encounter (Signed)
Last Rx 11/06/2016. Last OV 11/2016

## 2016-11-25 NOTE — Telephone Encounter (Signed)
plz phone in. 

## 2016-11-27 NOTE — Telephone Encounter (Signed)
Rx called in as prescribed 

## 2016-12-07 ENCOUNTER — Encounter: Payer: Self-pay | Admitting: Family Medicine

## 2016-12-07 ENCOUNTER — Ambulatory Visit (INDEPENDENT_AMBULATORY_CARE_PROVIDER_SITE_OTHER): Payer: BLUE CROSS/BLUE SHIELD | Admitting: Family Medicine

## 2016-12-07 VITALS — BP 128/88 | HR 97 | Temp 98.4°F | Ht 73.0 in | Wt 278.8 lb

## 2016-12-07 DIAGNOSIS — M25561 Pain in right knee: Secondary | ICD-10-CM | POA: Diagnosis not present

## 2016-12-07 MED ORDER — DICLOFENAC SODIUM 1 % TD GEL: 1.0000 "application " | Freq: Three times a day (TID) | TRANSDERMAL | 1 refills | Status: DC

## 2016-12-07 MED ORDER — MELOXICAM 15 MG PO TABS: 15.0000 mg | ORAL_TABLET | Freq: Every day | ORAL | 3 refills | Status: DC

## 2016-12-07 NOTE — Assessment & Plan Note (Signed)
Anticipate pes anserine bursitis - treat with meloxicam, voltaren, heating pad. [facility].  I also anticipate he has medial meniscal and MCL irritation.  Update if not improving with treatment.

## 2016-12-07 NOTE — Patient Instructions (Signed)
I do think you have knee bursitis as well as possible meniscal injury. May use heating pad.  Continue meloxicam, start voltaren gel to tender areas, and do stretching exercises provided today.  Let us know if not better.

## 2016-12-07 NOTE — Progress Notes (Signed)
BP 128/88   Pulse 97   Temp 98.4 F (36.9 C)   Ht 6\' 1"  (1.854 m)   Wt 278 lb 12 oz (126.4 kg)   SpO2 99%   BMI 36.78 kg/m    CC: R knee pain Subjective:    Patient ID: Jonathon [name], male    DOB: 1966/02/25, 51 y.o.   MRN: [id number]  HPI: Jonathon [name] is a 51 y.o. male presenting on 12/07/2016 for Acute Visit (swollen R knee)   Flexeril caused SI, worsened depression to point of needing to call suicide hotline.  Requests meloxicam refill - this is more effective than tramadol.   H/o chronic L knee pain - [facility]. Previously thought arthritis + PFPS. Treated with knee brace, elevation, ice, meloxicam. L knee has been stable.   Over the past month noticing worsening R knee pain and swelling. Pain medially and anteriorly, popping with extension. Denies inciting trauma/injury. R knee gives out when walking. No locking in place. No redness or warmth.   Relevant past medical, surgical, family and social history reviewed and updated as indicated. Interim medical history since our last visit reviewed. Allergies and medications reviewed and updated. Outpatient Medications Prior to Visit  Medication Sig Dispense Refill  . atorvastatin (LIPITOR) 10 MG tablet Take 1 tablet (10 mg total) by mouth daily at 6 PM. 90 tablet 3  . Avanafil (STENDRA) 100 MG TABS TAKE 1/2 - 1 TABLET (50-100 MG) BY MOUTH DAILY AS NEEDED. 10 tablet 1  . gabapentin (NEURONTIN) 300 MG capsule Take 1 capsule (300 mg total) by mouth 2 (two) times daily. 180 capsule 3  . glipiZIDE (GLUCOTROL) 10 MG tablet Take 10 mg by mouth 2 (two) times daily before lunch and supper.    Marland Kitchen glucose blood (ONE TOUCH ULTRA TEST) test strip Check blood sugar twice a day and as instructed. Dx 250.00 100 each 3  . Insulin Pen Needle 32G X 6 MM MISC 1 Units by Does not apply route as directed. 90 each 3  . lidocaine (LIDODERM) 5 % Place 1 patch onto the skin every 12 (twelve) hours. Remove & Discard patch within 12 hours or as directed  by MD 10 patch 0  . lisinopril (PRINIVIL,ZESTRIL) 10 MG tablet Take 1 tablet (10 mg total) by mouth daily. 90 tablet 3  . metFORMIN (GLUCOPHAGE) 500 MG tablet Take 1 tablet (500 mg total) by mouth 2 (two) times daily with a meal. 180 tablet 3  . cyclobenzaprine (FLEXERIL) 10 MG tablet Take 1 tablet (10 mg total) by mouth 2 (two) times daily as needed for muscle spasms. 20 tablet 0  . traMADol (ULTRAM) 50 MG tablet TAKE 1 TABLET BY MOUTH TWICE A DAY AS NEEDED MODERATE PAIN 40 tablet 0   No facility-administered medications prior to visit.      Per HPI unless specifically indicated in ROS section below Review of Systems     Objective:    BP 128/88   Pulse 97   Temp 98.4 F (36.9 C)   Ht 6\' 1"  (1.854 m)   Wt 278 lb 12 oz (126.4 kg)   SpO2 99%   BMI 36.78 kg/m   Wt Readings from Last 3 Encounters:  12/07/16 278 lb 12 oz (126.4 kg)  11/15/16 281 lb 0.1 oz (127.5 kg)  08/17/16 285 lb 12.8 oz (129.6 kg)    Physical Exam  Constitutional: He appears well-developed and well-nourished. No distress.  Musculoskeletal: He exhibits no edema.  R  knee exam: No deformity on inspection. Tender to palpation of knee at pes anserine bursa, at medial joint line, and superior to patella Mild effusion/swelling noted. FROM in flex/extension without significant crepitus. No popliteal fullness. Neg drawer test. + mcmurray test. No pain with valgus/varus stress. Mild PFgrind. No abnormal patellar mobility.   Skin: Skin is warm and dry. No rash noted. No erythema.  Nursing note and vitals reviewed.  Results for orders placed or performed in visit on 11/15/16  Hemoglobin A1c  Result Value Ref Range   Hgb A1c MFr Bld 7.2 (H) 4.6 - 6.5 %  HM DIABETES EYE EXAM  Result Value Ref Range   HM Diabetic Eye Exam No Retinopathy No Retinopathy   Lab Results  Component Value Date   CREATININE 1.09 11/07/2016    Lab Results  Component Value Date   HGBA1C 7.2 (H) 11/15/2016       Assessment & Plan:    Problem List Items Addressed This Visit    Right knee pain - Primary    Anticipate pes anserine bursitis - treat with meloxicam, voltaren, heating pad. [facility].  I also anticipate he has medial meniscal and MCL irritation.  Update if not improving with treatment.           Follow up plan: No Follow-up on file.  Eustaquio BoydenJavier Kristine Chahal, MD

## 2016-12-22 ENCOUNTER — Telehealth: Payer: Self-pay

## 2016-12-22 NOTE — Telephone Encounter (Signed)
PA has been submitted through cover mymeds   Awaiting response

## 2017-08-16 IMAGING — MR MR HEAD W/O CM
9 of 10 series · 36 of 48 positions shown · non-contrast
Comparison: CT HEAD August 08, 2016 at 2488 hours

CLINICAL DATA: Diplopia and headache beginning yesterday morning.
Worsening RIGHT retro-orbital pain. History of diabetes,
hypertension.

EXAM:
MRI HEAD WITHOUT CONTRAST
TECHNIQUE: Multiplanar, multiecho pulse sequences of the brain and surrounding
structures were obtained without intravenous contrast.

[Series 3: DWI · axial · 3.0mm · 1.09mm/px · z∈[-48,+89]mm · 9 of 94 slices shown (1 of 4)]
[im 1/94]
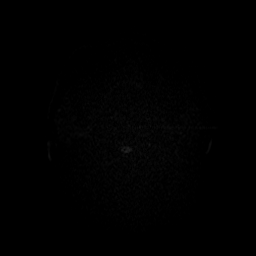
[im 11/94]
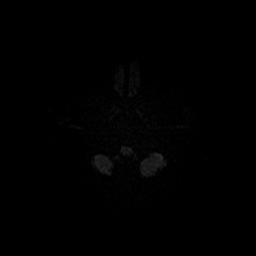
[im 21/94]
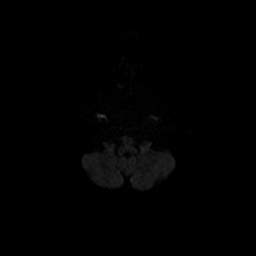
[im 32/94]
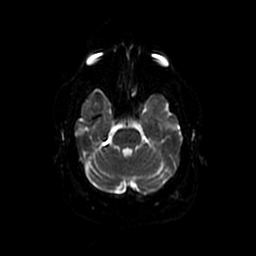
[im 42/94]
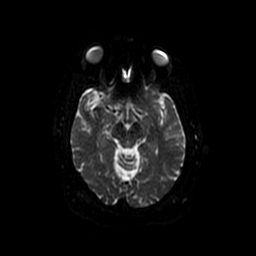
[im 52/94]
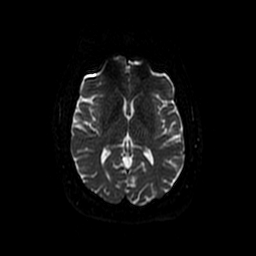
[im 63/94]
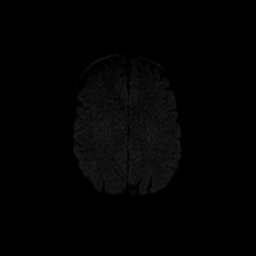
[im 83/94]
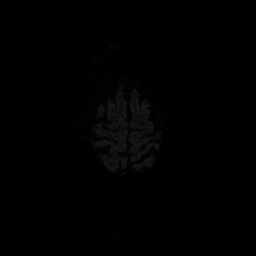
[im 94/94]
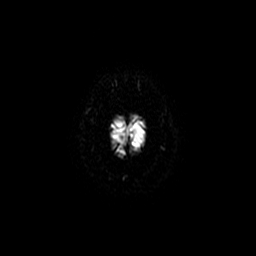

[Series 4: DWI · coronal · 5.0mm · 1.09mm/px · 7 of 66 slices shown (2 of 4)]
[im 1/66]
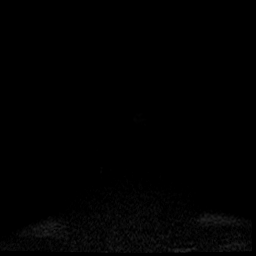
[im 11/66]
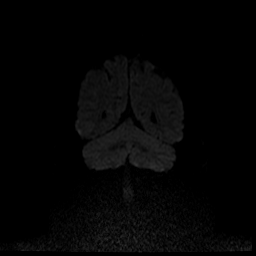
[im 22/66]
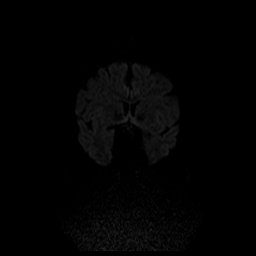
[im 33/66]
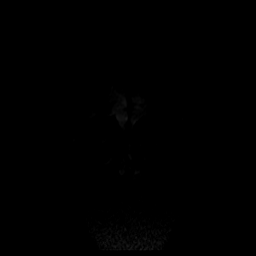
[im 44/66]
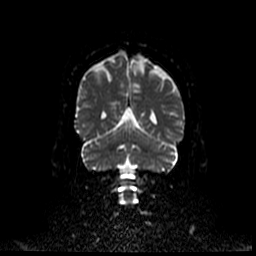
[im 55/66]
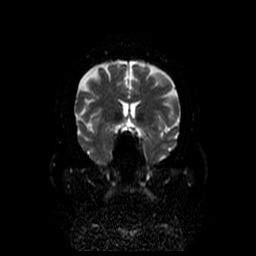
[im 66/66]
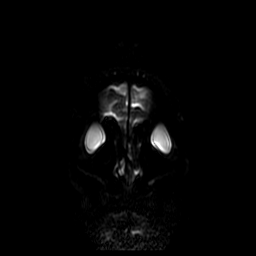

[Series 5: T1 · sagittal · 5.0mm · 0.47mm/px · 2 of 23 slices shown]
[im 1/23]
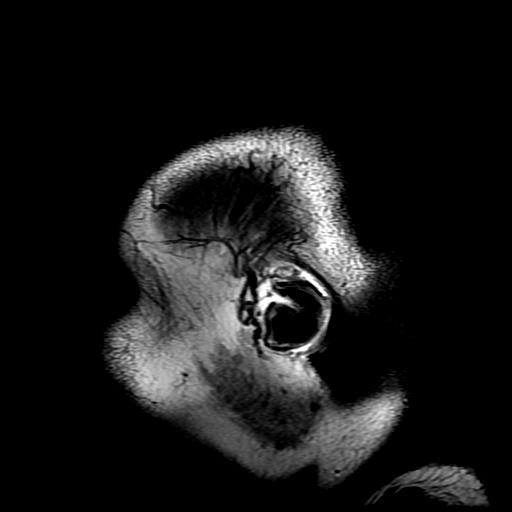
[im 23/23]
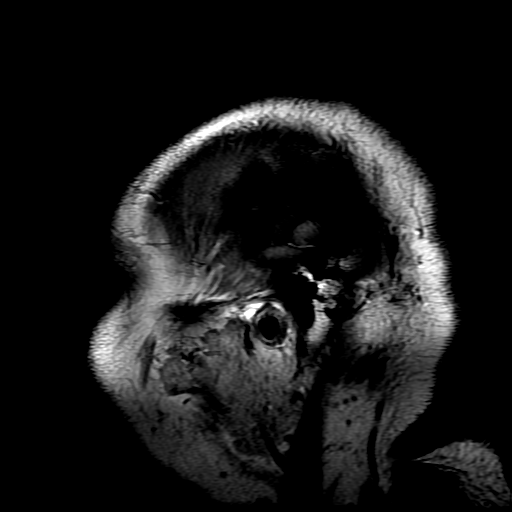

[Series 6: T2 · axial · 5.0mm · 0.47mm/px · z∈[-53,+91]mm · 3 of 25 slices shown (1 of 2)]
[im 1/25]
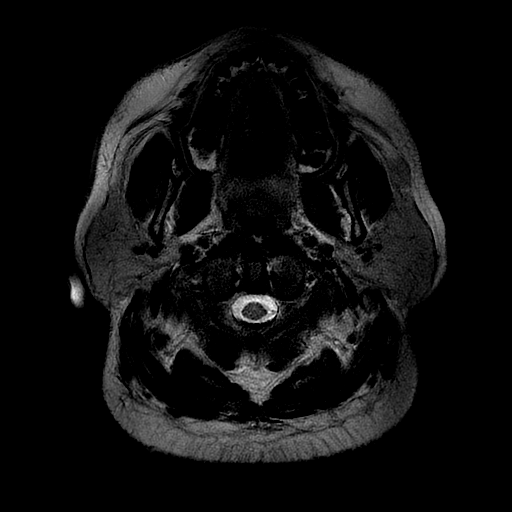
[im 13/25]
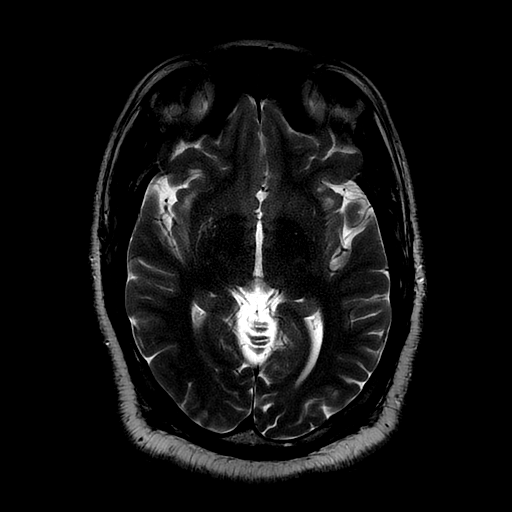
[im 25/25]
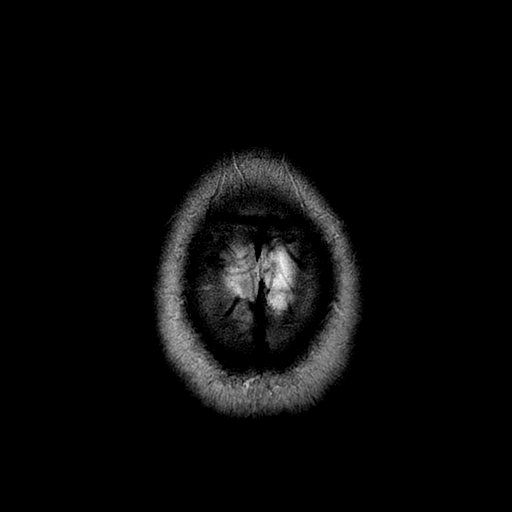

[Series 7: FLAIR · axial · 5.0mm · 0.47mm/px · 1 of 13 slices shown]
[im 1/13]
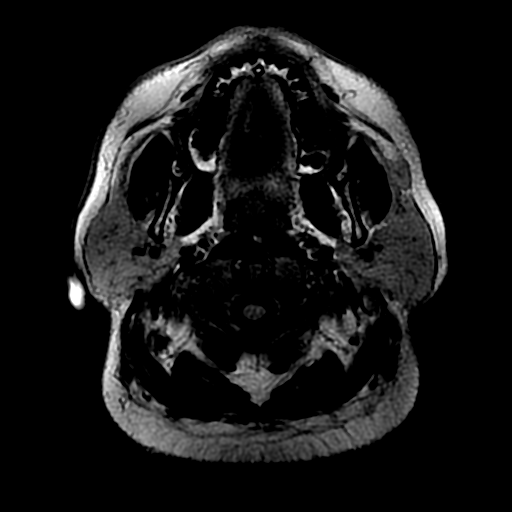

[Series 8: ax mpgr · axial · 5.0mm · 0.47mm/px · z∈[-53,+91]mm · 3 of 25 slices shown]
[im 1/25]
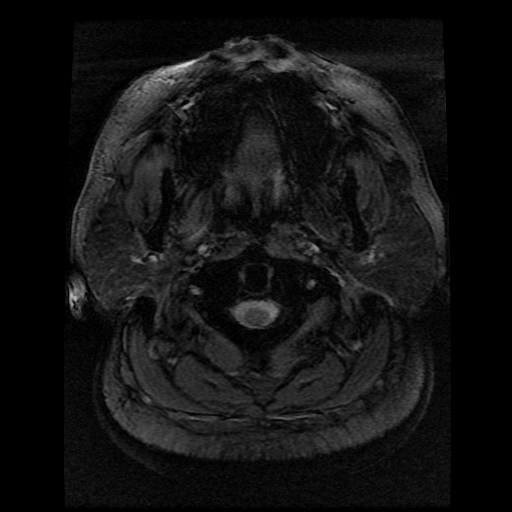
[im 13/25]
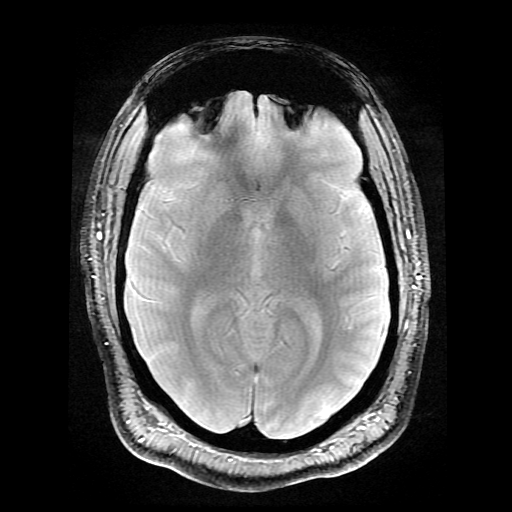
[im 25/25]
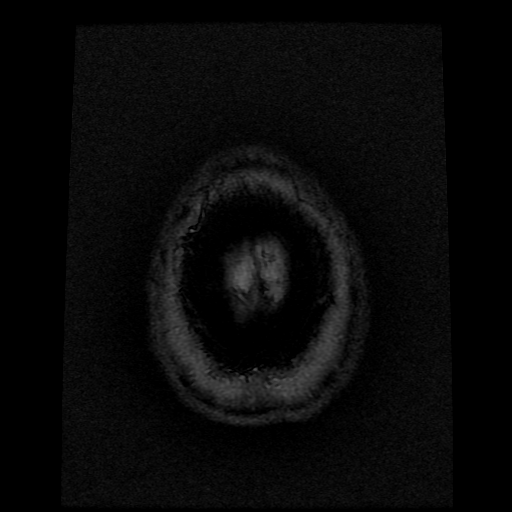

[Series 10: T2 · coronal · 5.0mm · 0.43mm/px · 3 of 28 slices shown (2 of 2)]
[im 1/28]
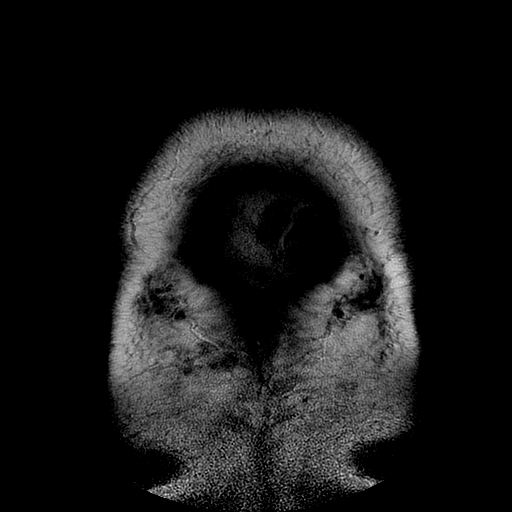
[im 14/28]
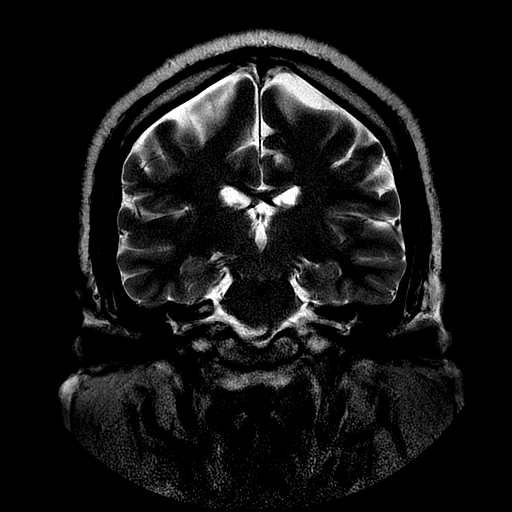
[im 28/28]
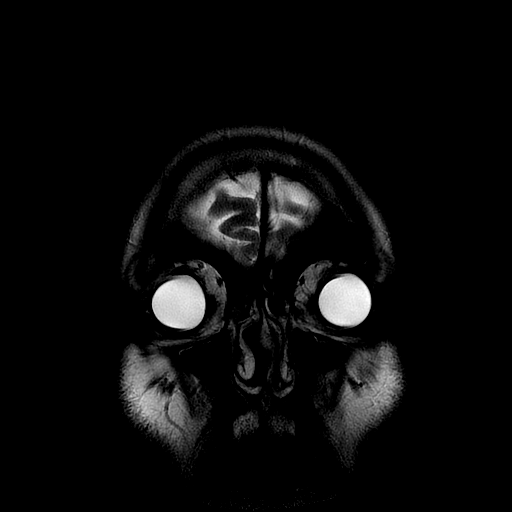

[Series 300: DWI · axial · 3.0mm · 1.09mm/px · z∈[-48,+89]mm · 5 of 47 slices shown (3 of 4)]
[im 1/47]
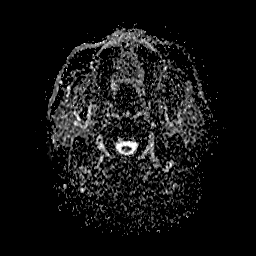
[im 12/47]
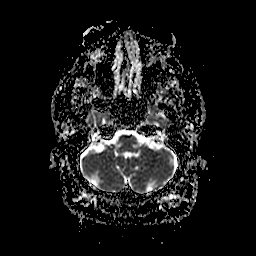
[im 24/47]
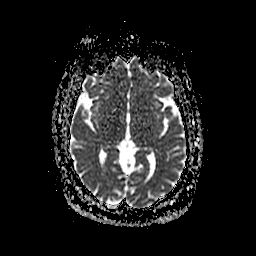
[im 35/47]
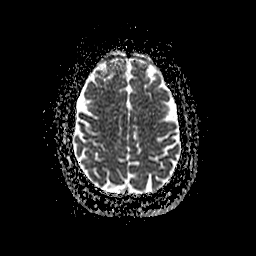
[im 47/47]
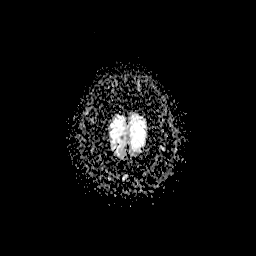

[Series 400: DWI · coronal · 5.0mm · 1.09mm/px · 3 of 33 slices shown (4 of 4)]
[im 1/33]
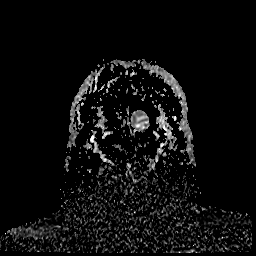
[im 17/33]
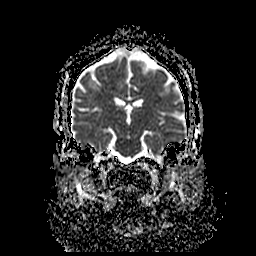
[im 33/33]
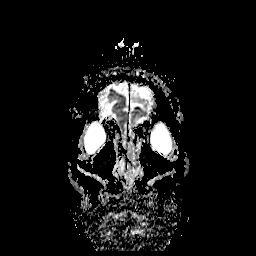

[36 of 48 positions shown; findings below may reference images not displayed]

FINDINGS: BRAIN: No reduced diffusion to suggest acute ischemia. No
susceptibility artifact to suggest hemorrhage. The ventricles and
sulci are normal for patient's age. No suspicious parenchymal
signal, masses or mass effect. No abnormal extra-axial fluid
collections. No extra-axial masses though, contrast enhanced
sequences would be more sensitive.

VASCULAR: Normal major intracranial vascular flow voids present at
skull base.

SKULL AND UPPER CERVICAL SPINE: No abnormal sellar expansion. No
suspicious calvarial bone marrow signal. Craniocervical junction
maintained.

SINUSES/ORBITS: The mastoid air-cells and included paranasal sinuses
are well-aerated. The included ocular globes and orbital contents
are non-suspicious.

OTHER: None.
IMPRESSION: Negative noncontrast MRI head.

## 2017-08-16 IMAGING — CT CT HEAD W/O CM
3 of 4 series · 17 of 47 positions shown, 20 images · non-contrast
Comparison: None.

CLINICAL DATA: Double vision and headache

EXAM:
CT HEAD WITHOUT CONTRAST
TECHNIQUE: Contiguous axial images were obtained from the base of the skull
through the vertex without intravenous contrast.

[Series 201: head w/o, idose (1) · axial · non-contrast · 0.44mm/px · z∈[+112,+252]mm · 11 of 34 slices shown, 14 images]
[im 3/34  brain]
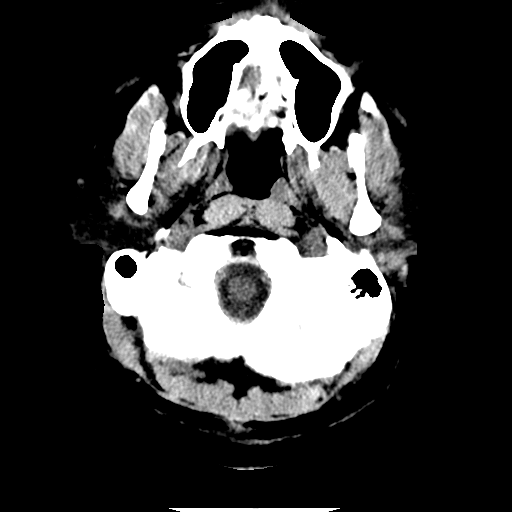
[im 3/34  bone]
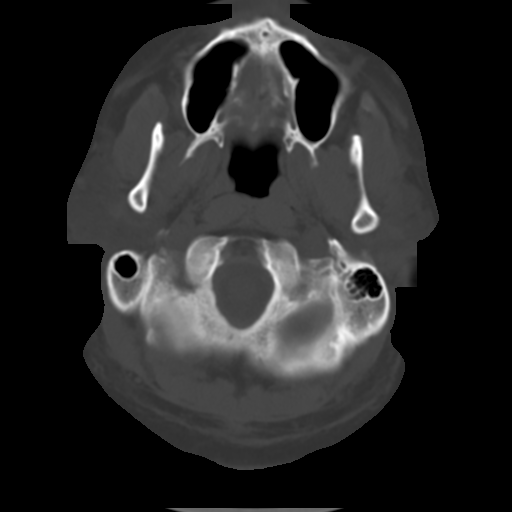
[im 5/34  brain]
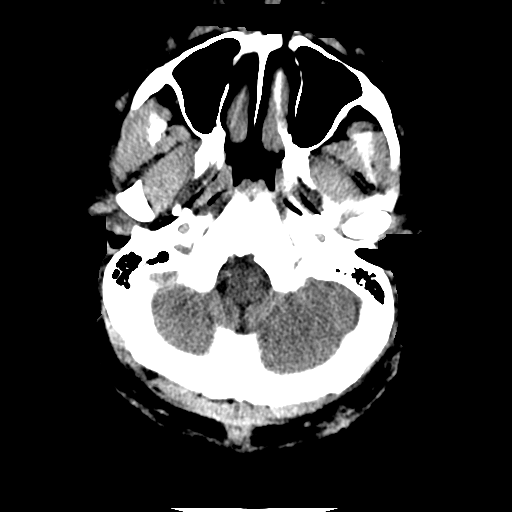
[im 8/34  brain]
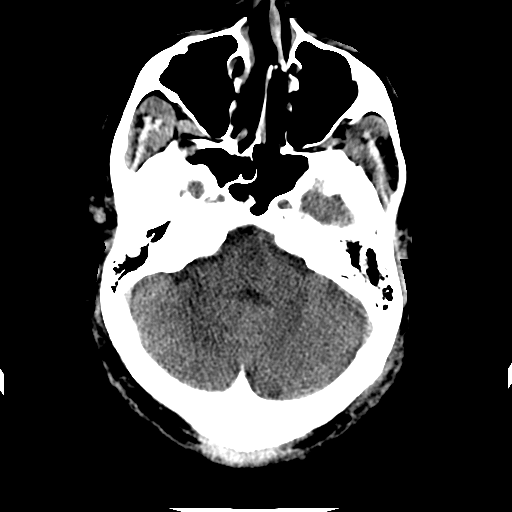
[im 12/34  brain]
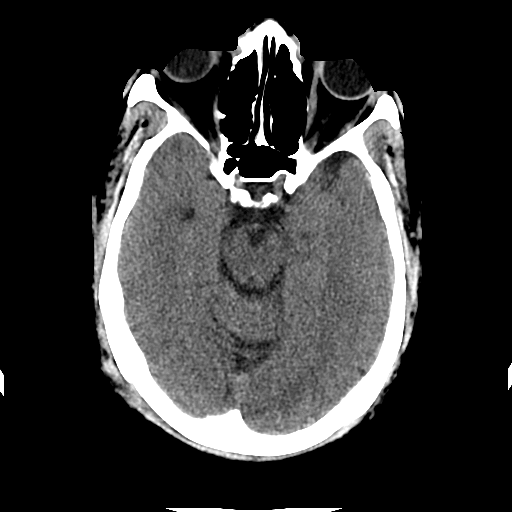
[im 15/34  brain]
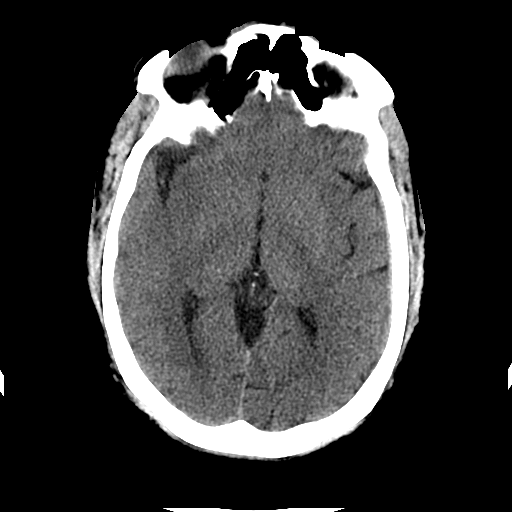
[im 15/34  bone]
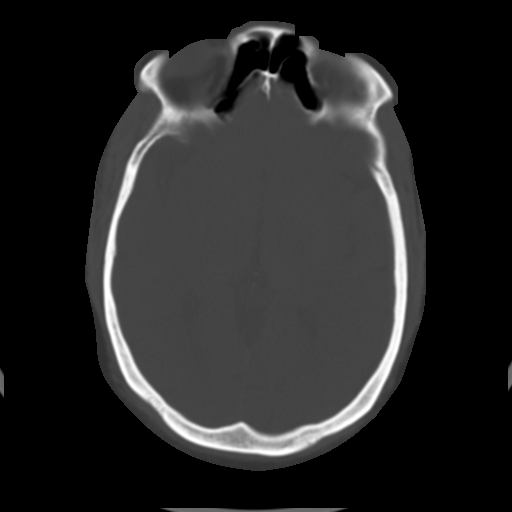
[im 17/34  brain]
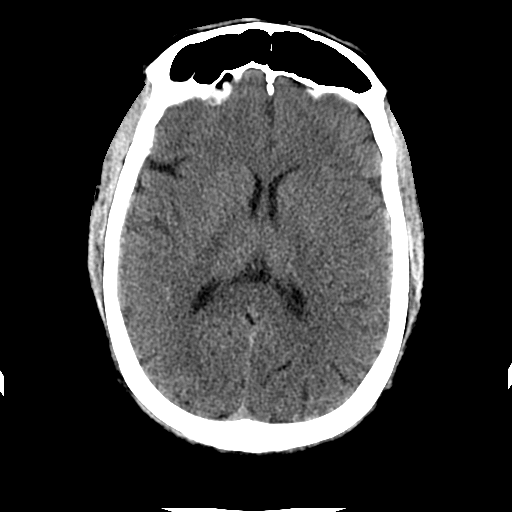
[im 19/34  brain]
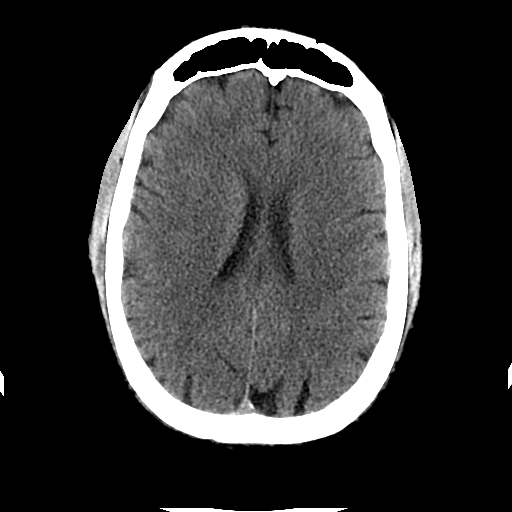
[im 22/34  brain]
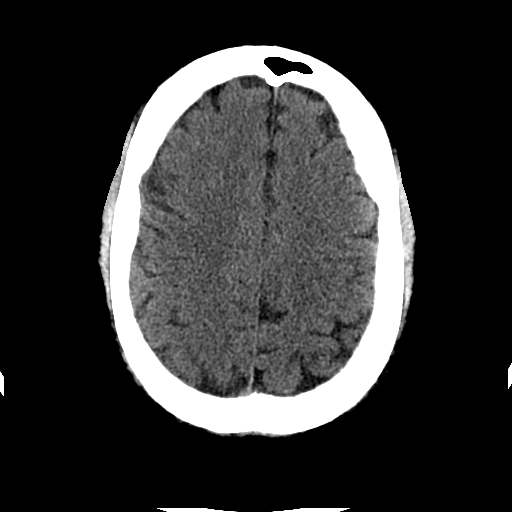
[im 26/34  brain]
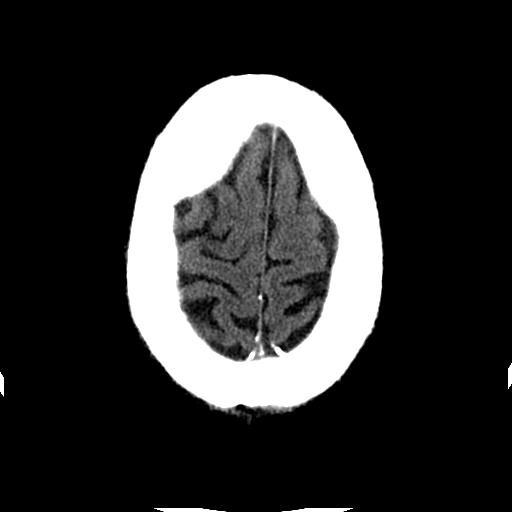
[im 26/34  bone]
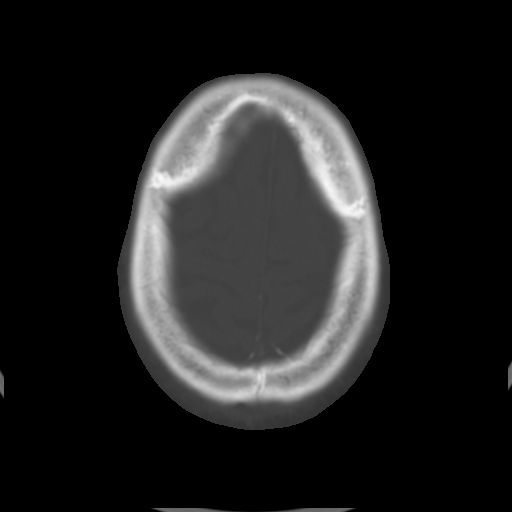
[im 29/34  brain]
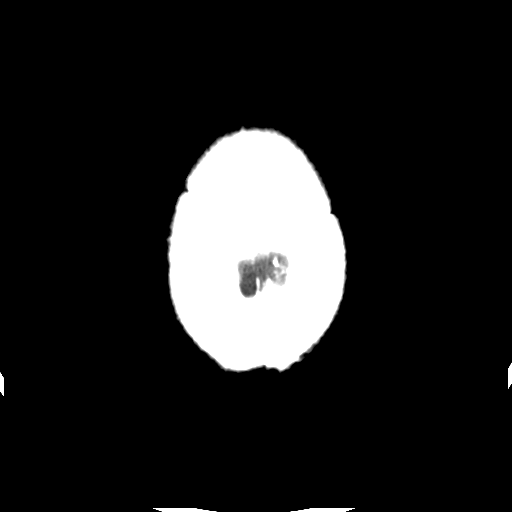
[im 31/34  brain]
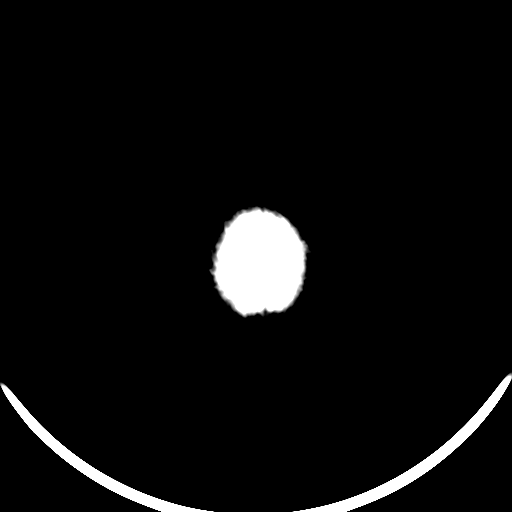

[Series 203: coronal st, idose (1) · coronal · 0.40mm/px · 3 of 74 slices shown]
[im 25/74  brain]
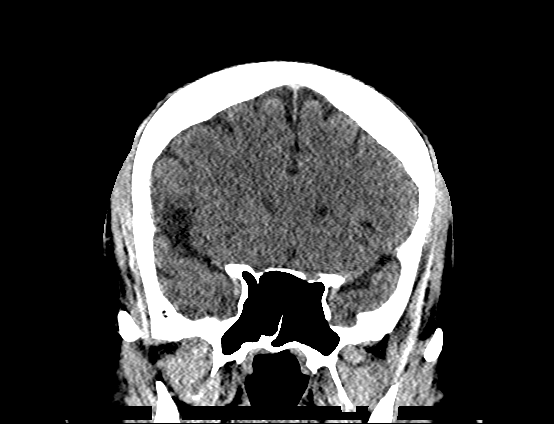
[im 33/74  brain]
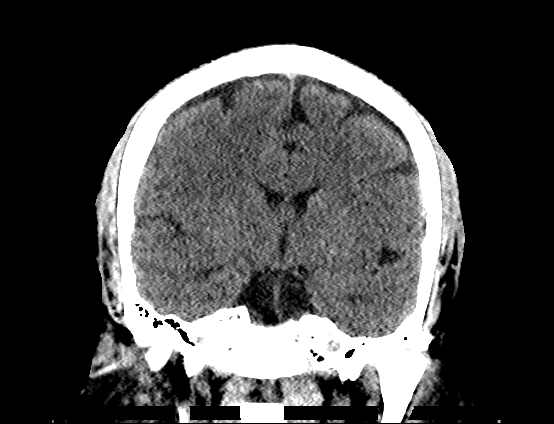
[im 41/74  brain]
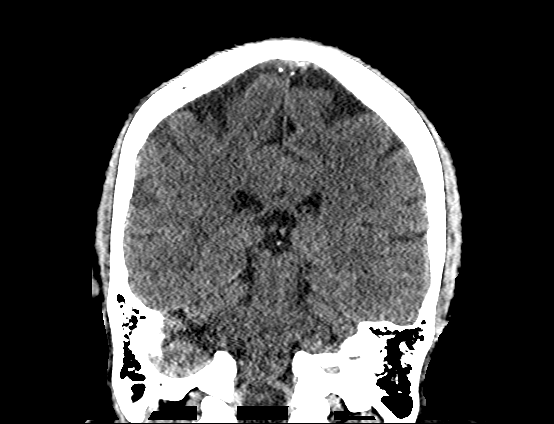

[Series 204: sagittal st, idose (1) · sagittal · 0.40mm/px · 3 of 74 slices shown]
[im 25/74  brain]
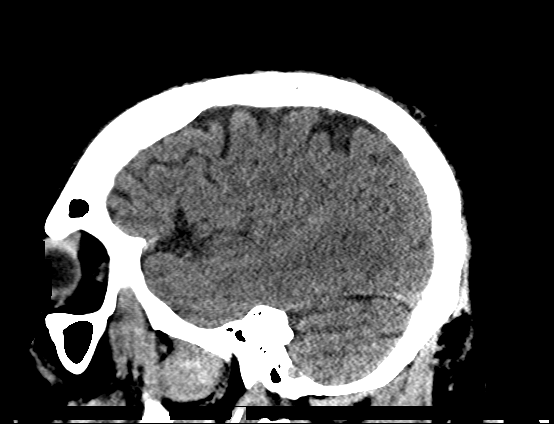
[im 37/74  brain]
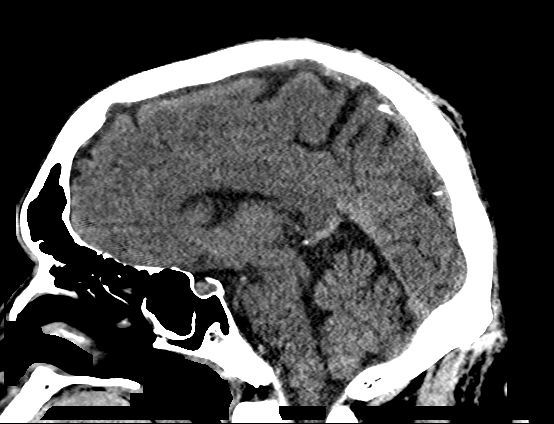
[im 49/74  brain]
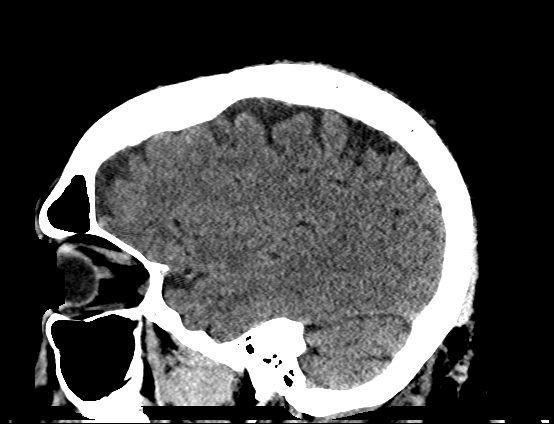

[17 of 47 positions shown; findings below may reference images not displayed]

FINDINGS: Brain: No acute intraparenchymal hemorrhage, midline shift or edema.
Normal size ventricles. No acute large vascular territory
infarction. No extra-axial fluid. Minimal likely remote small vessel
ischemic disease periventricular white matter.

Vascular: No hyperdense vessel or unexpected calcification.

Skull: Normal. Negative for fracture or focal lesion.

Sinuses/Orbits: Clear mastoids bilaterally. The visualized paranasal
sinuses are nonacute. Orbits are intact and symmetric in appearance.

Other: None
IMPRESSION: No acute intracranial abnormality. Minimal small vessel ischemic
disease of periventricular white matter.

## 2017-09-10 ENCOUNTER — Telehealth: Payer: Self-pay | Admitting: Family Medicine

## 2017-09-10 NOTE — Telephone Encounter (Addendum)
[facility] [address]

## 2017-09-10 NOTE — Telephone Encounter (Signed)
E prescribing Error - plz phone in instead. Thanks.

## 2017-12-20 ENCOUNTER — Telehealth: Payer: Self-pay | Admitting: Family Medicine

## 2017-12-20 NOTE — Telephone Encounter (Signed)
Copied from CRM [phone]. Topic: Quick Communication - See Telephone Encounter >> Dec 20, 2017 11:01 AM Floria RavelingStovall, Shana A wrote: CRM for notification. See Telephone encounter for: 12/20/17.  Pt called in and stated that the STENDRA 100 MG TABS [[id number]][[id number]] is $280.0  for 4 pill, he stated he can not afford that.  He would like to know if he could be switched to something different?  [facility] [address]

## 2017-12-20 NOTE — Telephone Encounter (Signed)
I spoke with pt and he will ck with ins co to see what substitute that is covered by his ins for Jonathon RalphStendra that would be more affordable. Will do CRM so [facility] can talk with pt.

## 2018-02-14 HISTORY — PX: COLONOSCOPY: SHX174

## 2018-02-14 LAB — HM COLONOSCOPY

## 2018-03-11 HISTORY — PX: COLONOSCOPY: SHX174

## 2018-03-11 LAB — HM COLONOSCOPY

## 2018-05-03 ENCOUNTER — Other Ambulatory Visit: Payer: Self-pay | Admitting: Family Medicine

## 2018-05-06 NOTE — Telephone Encounter (Signed)
Name of Medication: Tramadol (not on current med list) Name of Pharmacy: [facility] Last [place] Ahr or Written Date and Quantity: 11/30/16, #40 Last Office Visit and Type: 12/07/16, acute Next Office Visit and Type: none Last Controlled Substance Agreement Date: none Last UDS: none

## 2018-05-15 ENCOUNTER — Telehealth: Payer: Self-pay

## 2018-05-15 NOTE — Telephone Encounter (Signed)
I spoke with pt; pt is needing meds refilled and wants to schedule med refill appt now and then will schedule CPX later. Pt will come fasting incase needs to have labs done. Last labs 11/15/16. Pt has meds to last until appt scheduled on 05/21/18 at 10:15.

## 2018-05-20 ENCOUNTER — Encounter: Payer: Self-pay | Admitting: Family Medicine

## 2018-05-20 NOTE — Progress Notes (Deleted)
   There were no vitals taken for this visit.   CC: med refill visit Subjective:    Patient ID: Jonathon [name], male    DOB: 1965/11/06, 52 y.o.   MRN: [id number]  HPI: Jonathon [name] is a 52 y.o. male presenting on 05/21/2018 for No chief complaint on file.   Last seen here 11/2016 - did not f/u as asked. [facility]   Relevant past medical, surgical, family and social history reviewed and updated as indicated. Interim medical history since our last visit reviewed. Allergies and medications reviewed and updated. Outpatient Medications Prior to Visit  Medication Sig Dispense Refill  . atorvastatin (LIPITOR) 10 MG tablet Take 1 tablet (10 mg total) by mouth daily at 6 PM. 90 tablet 3  . diclofenac sodium (VOLTAREN) 1 % GEL Apply 1 application topically 3 (three) times daily. 1 Tube 1  . gabapentin (NEURONTIN) 300 MG capsule Take 1 capsule (300 mg total) by mouth 2 (two) times daily. 180 capsule 3  . glipiZIDE (GLUCOTROL) 10 MG tablet Take 10 mg by mouth 2 (two) times daily before lunch and supper.    Marland Kitchen glucose blood (ONE TOUCH ULTRA TEST) test strip Check blood sugar twice a day and as instructed. Dx 250.00 100 each 3  . Insulin Pen Needle 32G X 6 MM MISC 1 Units by Does not apply route as directed. 90 each 3  . lisinopril (PRINIVIL,ZESTRIL) 10 MG tablet Take 1 tablet (10 mg total) by mouth daily. 90 tablet 3  . meloxicam (MOBIC) 15 MG tablet Take 1 tablet (15 mg total) by mouth daily. 30 tablet 3  . metFORMIN (GLUCOPHAGE) 500 MG tablet Take 1 tablet (500 mg total) by mouth 2 (two) times daily with a meal. 180 tablet 3  . STENDRA 100 MG TABS TAKE 1/2 - 1 TABLET (50-100 MG) BY MOUTH DAILY AS NEEDED. 10 tablet 1   No facility-administered medications prior to visit.      Per HPI unless specifically indicated in ROS section below Review of Systems     Objective:    There were no vitals taken for this visit.  Wt Readings from Last 3 Encounters:  12/07/16 278 lb 12 oz (126.4  kg)  11/15/16 281 lb 0.1 oz (127.5 kg)  08/17/16 285 lb 12.8 oz (129.6 kg)    Physical Exam Results for orders placed or performed in visit on 11/15/16  Hemoglobin A1c  Result Value Ref Range   Hgb A1c MFr Bld 7.2 (H) 4.6 - 6.5 %  HM DIABETES EYE EXAM  Result Value Ref Range   HM Diabetic Eye Exam No Retinopathy No Retinopathy      Assessment & Plan:   Problem List Items Addressed This Visit    None       No orders of the defined types were placed in this encounter.  No orders of the defined types were placed in this encounter.   Follow up plan: No follow-ups on file.  Jonathon Boyden, MD

## 2018-05-21 ENCOUNTER — Ambulatory Visit: Payer: Non-veteran care | Admitting: Family Medicine

## 2018-05-21 DIAGNOSIS — Z0289 Encounter for other administrative examinations: Secondary | ICD-10-CM

## 2018-05-31 ENCOUNTER — Other Ambulatory Visit: Payer: Self-pay

## 2018-05-31 MED ORDER — GLIPIZIDE 10 MG PO TABS: 10.0000 mg | ORAL_TABLET | Freq: Two times a day (BID) | ORAL | 0 refills | Status: DC

## 2018-05-31 NOTE — Telephone Encounter (Signed)
E-scribed.  Pt has f/u on 06/03/18.

## 2018-06-02 NOTE — Progress Notes (Signed)
BP 134/80 (BP Location: Left Arm, Patient Position: Sitting, Cuff Size: Large)   Pulse 90   Temp 98.2 F (36.8 C) (Oral)   Ht 6\' 1"  (1.854 m)   Wt (!) 310 lb 12 oz (141 kg)   SpO2 96%   BMI 41.00 kg/m    CC: med refill visit Subjective:    Patient ID: Jonathon [name], male    DOB: September 26, 1965, 52 y.o.   MRN: [id number]  HPI: Jonathon [name] is a 52 y.o. male presenting on 06/03/2018 for Follow-up (Here for f/u and med refills. Pt accompanied by his fiance, Tonya. )   Last seen 11/2016, did not follow up - lost insurance. Now has new job.  [facility] [place].  Here with fiance Tonya.   HTN well controlled off lisinopril.  HLD - compliant with atorvastatin 10mg  daily. Told liver was fatty.   DM - does regularly check sugars every other day, morning 201 and night 253. Compliant with antihyperglycemic regimen which includes: metformin 500mg  bid and glipizide 5mg  bid. Denies low sugars or hypoglycemic symptoms. Some foot paresthesias. Last diabetic eye exam DUE. Pneumovax: DUE - declines. Prevnar: not due yet. Glucometer brand: one-touch. DSME: has completed through [place]. Lab Results  Component Value Date   HGBA1C 9.3 (A) 06/03/2018   Diabetic Foot Exam - Simple   Simple Foot Form Diabetic Foot exam was performed with the following findings:  Yes 06/03/2018  3:47 PM  Visual Inspection See comments:  Yes Sensation Testing Intact to touch and monofilament testing bilaterally:  Yes Pulse Check Posterior Tibialis and Dorsalis pulse intact bilaterally:  Yes Comments 2+ DP bilaterally Sensation intact, limited R sole due to skin thickening Darkened nail 2nd R toenail Maceration between toes    Lab Results  Component Value Date   MICROALBUR 2.6 (H) 03/04/2014      Requests switch from stendra (not covered by insurance although it worked the best). Would like to return to other PDE5 inhibitor.  Relevant past medical, surgical, family and social history reviewed  and updated as indicated. Interim medical history since our last visit reviewed. Allergies and medications reviewed and updated. Outpatient Medications Prior to Visit  Medication Sig Dispense Refill  . atorvastatin (LIPITOR) 10 MG tablet Take 1 tablet (10 mg total) by mouth daily at 6 PM. 90 tablet 3  . diclofenac sodium (VOLTAREN) 1 % GEL Apply 1 application topically 3 (three) times daily. 1 Tube 1  . gabapentin (NEURONTIN) 300 MG capsule Take 1 capsule (300 mg total) by mouth 2 (two) times daily. 180 capsule 3  . glipiZIDE (GLUCOTROL) 10 MG tablet Take 1 tablet (10 mg total) by mouth 2 (two) times daily before lunch and supper. 60 tablet 0  . glucose blood (ONE TOUCH ULTRA TEST) test strip Check blood sugar twice a day and as instructed. Dx 250.00 100 each 3  . Insulin Pen Needle 32G X 6 MM MISC 1 Units by Does not apply route as directed. 90 each 3  . meloxicam (MOBIC) 15 MG tablet Take 1 tablet (15 mg total) by mouth daily. 30 tablet 3  . metFORMIN (GLUCOPHAGE) 500 MG tablet Take 1 tablet (500 mg total) by mouth 2 (two) times daily with a meal. 180 tablet 3  . STENDRA 100 MG TABS TAKE 1/2 - 1 TABLET (50-100 MG) BY MOUTH DAILY AS NEEDED. 10 tablet 1  . lisinopril (PRINIVIL,ZESTRIL) 10 MG tablet Take 1 tablet (10 mg total) by mouth daily. 90 tablet  3   No facility-administered medications prior to visit.      Per HPI unless specifically indicated in ROS section below Review of Systems     Objective:    BP 134/80 (BP Location: Left Arm, Patient Position: Sitting, Cuff Size: Large)   Pulse 90   Temp 98.2 F (36.8 C) (Oral)   Ht 6\' 1"  (1.854 m)   Wt (!) 310 lb 12 oz (141 kg)   SpO2 96%   BMI 41.00 kg/m   Wt Readings from Last 3 Encounters:  06/03/18 (!) 310 lb 12 oz (141 kg)  12/07/16 278 lb 12 oz (126.4 kg)  11/15/16 281 lb 0.1 oz (127.5 kg)    Physical Exam  Constitutional: He appears well-developed and well-nourished. No distress.  HENT:  Head: Normocephalic and  atraumatic.  Right Ear: External ear normal.  Left Ear: External ear normal.  Nose: Nose normal.  Mouth/Throat: Oropharynx is clear and moist. No oropharyngeal exudate.  Eyes: Pupils are equal, round, and reactive to light. Conjunctivae and EOM are normal. No scleral icterus.  Neck: Normal range of motion. Neck supple.  Cardiovascular: Normal rate, regular rhythm, normal heart sounds and intact distal pulses.  No murmur heard. Pulmonary/Chest: Effort normal and breath sounds normal. No respiratory distress. He has no wheezes. He has no rales.  Musculoskeletal: He exhibits no edema.  See HPI for foot exam if done  Lymphadenopathy:    He has no cervical adenopathy.  Skin: Skin is warm and dry. No rash noted.  Psychiatric: He has a normal mood and affect.  Nursing note and vitals reviewed.  Results for orders placed or performed in visit on 06/03/18  POCT glycosylated hemoglobin (Hb A1C)  Result Value Ref Range   Hemoglobin A1C 9.3 (A) 4.0 - 5.6 %   HbA1c POC (<> result, manual entry)     HbA1c, POC (prediabetic range)     HbA1c, POC (controlled diabetic range)        Assessment & Plan:   Problem List Items Addressed This Visit    Uncontrolled type 2 diabetes with neuropathy (HCC) - Primary    Chronic, uncontrolled. Desires to change from glipizide - interested in SLGT2 inhibitor. Will price out jardiance 10mg  daily, advised check online for savings program. Advised continue metformin. Check microalbumin today.       Relevant Medications   empagliflozin (JARDIANCE) 10 MG TABS tablet   Other Relevant Orders   POCT glycosylated hemoglobin (Hb A1C) (Completed)   Microalbumin / creatinine urine ratio   Tinea pedis    Maceration bilaterally Moccasin type tinea of right foot.  Discussed regular clotrimazole use. Consider oral antifungal.      Relevant Medications   clotrimazole (LOTRIMIN) 1 % cream   Severe obesity (BMI 35.0-35.9 with comorbidity) (HCC)    30lb weight gain  noted.       Relevant Medications   empagliflozin (JARDIANCE) 10 MG TABS tablet   Hypertension, essential    Pt endorse stable period off ACEI.       HLD (hyperlipidemia)    Chronic, stable on lipitor 10mg  daily - continue.       ED (erectile dysfunction) of organic origin    Discussed options. stendra has been the most effective but very expensive. Coupon printed out today.       Diabetic neuropathy (HCC)   Relevant Medications   empagliflozin (JARDIANCE) 10 MG TABS tablet       Meds ordered this encounter  Medications  . empagliflozin (JARDIANCE)  10 MG TABS tablet    Sig: Take 10 mg by mouth daily.    Dispense:  30 tablet    Refill:  6  . clotrimazole (LOTRIMIN) 1 % cream    Sig: Apply 1 application topically 2 (two) times daily.    Dispense:  60 g    Refill:  1   Orders Placed This Encounter  Procedures  . Microalbumin / creatinine urine ratio  . POCT glycosylated hemoglobin (Hb A1C)    Follow up plan: Return in about 3 months (around 09/03/2018) for follow up visit.  [name], MD

## 2018-06-03 ENCOUNTER — Ambulatory Visit: Payer: 59 | Admitting: Family Medicine

## 2018-06-03 ENCOUNTER — Encounter: Payer: Self-pay | Admitting: Family Medicine

## 2018-06-03 VITALS — BP 134/80 | HR 90 | Temp 98.2°F | Ht 73.0 in | Wt 310.8 lb

## 2018-06-03 DIAGNOSIS — E1142 Type 2 diabetes mellitus with diabetic polyneuropathy: Secondary | ICD-10-CM

## 2018-06-03 DIAGNOSIS — B353 Tinea pedis: Secondary | ICD-10-CM | POA: Diagnosis not present

## 2018-06-03 DIAGNOSIS — I10 Essential (primary) hypertension: Secondary | ICD-10-CM | POA: Diagnosis not present

## 2018-06-03 DIAGNOSIS — E1165 Type 2 diabetes mellitus with hyperglycemia: Secondary | ICD-10-CM | POA: Diagnosis not present

## 2018-06-03 DIAGNOSIS — E785 Hyperlipidemia, unspecified: Secondary | ICD-10-CM

## 2018-06-03 DIAGNOSIS — Z6835 Body mass index (BMI) 35.0-35.9, adult: Secondary | ICD-10-CM

## 2018-06-03 DIAGNOSIS — IMO0002 Reserved for concepts with insufficient information to code with codable children: Secondary | ICD-10-CM

## 2018-06-03 DIAGNOSIS — N529 Male erectile dysfunction, unspecified: Secondary | ICD-10-CM

## 2018-06-03 DIAGNOSIS — E1140 Type 2 diabetes mellitus with diabetic neuropathy, unspecified: Secondary | ICD-10-CM | POA: Diagnosis not present

## 2018-06-03 LAB — POCT GLYCOSYLATED HEMOGLOBIN (HGB A1C): Hemoglobin A1C: 9.3 % — AB (ref 4.0–5.6)

## 2018-06-03 MED ORDER — CLOTRIMAZOLE 1 % EX CREA: 1.0000 "application " | TOPICAL_CREAM | Freq: Two times a day (BID) | CUTANEOUS | 1 refills | Status: DC

## 2018-06-03 MED ORDER — EMPAGLIFLOZIN 10 MG PO TABS: 10.0000 mg | ORAL_TABLET | Freq: Every day | ORAL | 6 refills | Status: DC

## 2018-06-03 NOTE — Assessment & Plan Note (Signed)
30 lb weight gain noted.  

## 2018-06-03 NOTE — Assessment & Plan Note (Signed)
Chronic, uncontrolled. Desires to change from glipizide - interested in SLGT2 inhibitor. Will price out jardiance 10mg  daily, advised check online for savings program. Advised continue metformin. Check microalbumin today.

## 2018-06-03 NOTE — Assessment & Plan Note (Addendum)
Maceration bilaterally Moccasin type tinea of right foot.  Discussed regular clotrimazole use. Consider oral antifungal.

## 2018-06-03 NOTE — Assessment & Plan Note (Signed)
Pt endorse stable period off ACEI.

## 2018-06-03 NOTE — Patient Instructions (Addendum)
Urine test today (for protein) Schedule eye exam as you're due.  Continue metformin. Price out jardiance (alternatives in same family are farxiga and invokana). You can google jardiance for savings program.  Price out stendra (coupon provided today).  Return in 3 months for follow up visit.

## 2018-06-03 NOTE — Assessment & Plan Note (Signed)
Chronic, stable on lipitor 10mg  daily - continue.

## 2018-06-03 NOTE — Assessment & Plan Note (Signed)
Discussed options. stendra has been the most effective but very expensive. Coupon printed out today.

## 2018-06-04 LAB — MICROALBUMIN / CREATININE URINE RATIO
CREATININE, U: 112.2 mg/dL
MICROALB/CREAT RATIO: 0.8 mg/g (ref 0.0–30.0)
Microalb, Ur: 0.9 mg/dL (ref 0.0–1.9)

## 2018-06-22 ENCOUNTER — Other Ambulatory Visit: Payer: Self-pay | Admitting: Family Medicine

## 2018-06-23 ENCOUNTER — Other Ambulatory Visit: Payer: Self-pay | Admitting: Family Medicine

## 2018-07-03 ENCOUNTER — Telehealth: Payer: Self-pay | Admitting: *Deleted

## 2018-07-03 DIAGNOSIS — N529 Male erectile dysfunction, unspecified: Secondary | ICD-10-CM

## 2018-07-03 MED ORDER — SILDENAFIL CITRATE 100 MG PO TABS: 50.0000 mg | ORAL_TABLET | Freq: Every day | ORAL | 11 refills | Status: DC | PRN

## 2018-07-03 NOTE — Telephone Encounter (Signed)
Spoke to pt who states his insurance will not cover 100mg  of stendra, but instead viagra 100mg . [facility]

## 2018-07-03 NOTE — Telephone Encounter (Signed)
viagra sent to pharmacy 

## 2018-07-04 NOTE — Telephone Encounter (Signed)
Spoke with pt notifying him Dr. Reece AgarG sent rx to pharmacy.  Pt expresses his thanks.

## 2018-07-22 ENCOUNTER — Other Ambulatory Visit: Payer: Self-pay | Admitting: Family Medicine

## 2018-08-16 ENCOUNTER — Other Ambulatory Visit: Payer: Self-pay | Admitting: Family Medicine

## 2018-09-02 ENCOUNTER — Ambulatory Visit: Payer: Non-veteran care | Admitting: Family Medicine

## 2018-09-07 ENCOUNTER — Other Ambulatory Visit: Payer: Self-pay | Admitting: Family Medicine

## 2018-09-09 ENCOUNTER — Ambulatory Visit (INDEPENDENT_AMBULATORY_CARE_PROVIDER_SITE_OTHER): Payer: 59 | Admitting: Family Medicine

## 2018-09-09 ENCOUNTER — Encounter: Payer: Self-pay | Admitting: Family Medicine

## 2018-09-09 VITALS — BP 132/76 | HR 84 | Temp 98.6°F | Ht 73.0 in | Wt 310.4 lb

## 2018-09-09 DIAGNOSIS — E1140 Type 2 diabetes mellitus with diabetic neuropathy, unspecified: Secondary | ICD-10-CM

## 2018-09-09 DIAGNOSIS — E1165 Type 2 diabetes mellitus with hyperglycemia: Secondary | ICD-10-CM | POA: Diagnosis not present

## 2018-09-09 DIAGNOSIS — I10 Essential (primary) hypertension: Secondary | ICD-10-CM | POA: Diagnosis not present

## 2018-09-09 DIAGNOSIS — E1142 Type 2 diabetes mellitus with diabetic polyneuropathy: Secondary | ICD-10-CM | POA: Diagnosis not present

## 2018-09-09 DIAGNOSIS — B353 Tinea pedis: Secondary | ICD-10-CM

## 2018-09-09 DIAGNOSIS — IMO0002 Reserved for concepts with insufficient information to code with codable children: Secondary | ICD-10-CM

## 2018-09-09 LAB — POCT GLYCOSYLATED HEMOGLOBIN (HGB A1C): Hemoglobin A1C: 9.3 % — AB (ref 4.0–5.6)

## 2018-09-09 NOTE — Patient Instructions (Addendum)
Price out different medicines in same family as jardiance/empagliflozin (farxiga/dapagliflozin or invokana/canagliflozin).  [facility]. Let me know if you find a more affordable option through local pharmacy and I'll send in. Schedule eye doctor appointment.  Bring me copy of labs at the [place].  Return in 3 months for follow up visit.

## 2018-09-09 NOTE — Assessment & Plan Note (Signed)
Chronic, remains uncontrolled. With new year no longer able to afford jardiance although he felt it was significantly helping. Asked to price out SLGT2 alternatives and to [facility] for [facility] (has appt tomorrow). RTC 3 mo f/u visit. Pt agrees with plan.

## 2018-09-09 NOTE — Assessment & Plan Note (Signed)
Ongoing right sided. Using [facility] antifungal cream with benefit.

## 2018-09-09 NOTE — Assessment & Plan Note (Signed)
Remains off ACEI.

## 2018-09-09 NOTE — Assessment & Plan Note (Signed)
Notes burning neuropathic pain at night time however feels gabapentin may be causing pedal edema. Consider lyrica trial.

## 2018-09-09 NOTE — Progress Notes (Signed)
BP 132/76   Pulse 84   Temp 98.6 F (37 C)   Ht  (1.854 m)   Wt (!) 310 lb 6 oz (140.8 kg)   BMI 40.95 kg/m    CC: 3 mo f/u visit  Subjective:    Patient ID: Jonathon [name], male    DOB: 01-12-1966, 53 y.o.   MRN: [id number]  HPI: Budd Freiermuth is a 53 y.o. male presenting on 09/09/2018 for Diabetes (3 months follow up)   Receives meds through the [place] - has appt tomorrow.   DM - does regularly check sugars 190-210 fasting, 230-240 in evenings. (prior while on SLGT2 fasting 170s). Compliant with antihyperglycemic regimen which includes: jardiance  daily, glipizide  bid, metformin  bid. Last month no longer able to afford jardiance $8 to $500. Denies low sugars or hypoglycemic symptoms. Some paresthesias of feet - ongoing for the past few months. Last diabetic eye exam DUE. Pneumovax: DUE. Prevnar: not due. Glucometer brand: one-touch. DSME: through the [place]. A1c 8% at [facility] (06/2018) Lab Results  Component Value Date   HGBA1C 9.3 (A) 09/09/2018   Diabetic Foot Exam - Simple   Simple Foot Form Diabetic Foot exam was performed with the following findings:  Yes 09/09/2018  9:12 AM  Visual Inspection See comments:  Yes Sensation Testing See comments:  Yes Pulse Check Posterior Tibialis and Dorsalis pulse intact bilaterally:  Yes Comments Diminished sensation to monofilament bilaterally Maceration between 3-5th toes as well as scaling of sole at R foot    Lab Results  Component Value Date   MICROALBUR 0.9 06/03/2018         Relevant past medical, surgical, family and social history reviewed and updated as indicated. Interim medical history since our last visit reviewed. Allergies and medications reviewed and updated. Outpatient Medications Prior to Visit  Medication Sig Dispense Refill  . atorvastatin (LIPITOR) 10 MG tablet Take 1 tablet (10 mg total) by mouth daily at 6 PM. 90 tablet 3  . empagliflozin (JARDIANCE) 10 MG TABS tablet Take 10 mg by mouth daily. 30  tablet 6  . gabapentin (NEURONTIN) 300 MG capsule Take 1 capsule (300 mg total) by mouth 2 (two) times daily. 180 capsule 3  . glucose blood (ONE TOUCH ULTRA TEST) test strip Check blood sugar twice a day and as instructed. Dx 250.00 100 each 3  . meloxicam (MOBIC) 15 MG tablet Take 1 tablet (15 mg total) by mouth daily. 30 tablet 3  . metFORMIN (GLUCOPHAGE) 500 MG tablet TAKE 1 TABLET BY MOUTH TWICE A DAY WITH A MEAL 180 tablet 0  . sildenafil (VIAGRA) 100 MG tablet Take 0.5-1 tablets (50-100 mg total) by mouth daily as needed for erectile dysfunction. 5 tablet 11  . STENDRA 100 MG TABS TAKE 1/2 - 1 TABLET (50-100 MG) BY MOUTH DAILY AS NEEDED. 10 tablet 1  . glipiZIDE (GLUCOTROL) 10 MG tablet TAKE 1 TABLET (10 MG TOTAL) BY MOUTH 2 (TWO) TIMES DAILY BEFORE LUNCH AND SUPPER. 60 tablet 0  . clotrimazole (LOTRIMIN) 1 % cream Apply 1 application topically 2 (two) times daily. 60 g 1  . diclofenac sodium (VOLTAREN) 1 % GEL Apply 1 application topically 3 (three) times daily. 1 Tube 1  . Insulin Pen Needle 32G X 6 MM MISC 1 Units by Does not apply route as directed. 90 each 3   No facility-administered medications prior to visit.      Per HPI unless specifically indicated in ROS section below Review of Systems  Objective:    BP 132/76   Pulse 84   Temp 98.6 F (37 C)   Ht 6\' 1"  (1.854 m)   Wt (!) 310 lb 6 oz (140.8 kg)   BMI 40.95 kg/m   Wt Readings from Last 3 Encounters:  09/09/18 (!) 310 lb 6 oz (140.8 kg)  06/03/18 (!) 310 lb 12 oz (141 kg)  12/07/16 278 lb 12 oz (126.4 kg)    Physical Exam Vitals signs and nursing note reviewed.  Constitutional:      General: He is not in acute distress.    Appearance: He is well-developed.  HENT:     Head: Normocephalic and atraumatic.     Right Ear: External ear normal.     Left Ear: External ear normal.     Nose: Nose normal.     Mouth/Throat:     Mouth: Mucous membranes are moist.     Pharynx: No oropharyngeal exudate.  Eyes:      General: No scleral icterus.    Extraocular Movements: Extraocular movements intact.     Conjunctiva/sclera: Conjunctivae normal.     Pupils: Pupils are equal, round, and reactive to light.  Neck:     Musculoskeletal: Normal range of motion and neck supple.  Cardiovascular:     Rate and Rhythm: Normal rate and regular rhythm.     Heart sounds: Normal heart sounds. No murmur.  Pulmonary:     Effort: Pulmonary effort is normal. No respiratory distress.     Breath sounds: Normal breath sounds. No wheezing or rales.  Musculoskeletal:        General: Swelling present.     Right lower leg: Edema (nonpitting) present.     Left lower leg: Edema (nonpitting) present.     Comments: See HPI for foot exam if done  Lymphadenopathy:     Cervical: No cervical adenopathy.  Skin:    General: Skin is warm and dry.     Findings: No rash.       Results for orders placed or performed in visit on 09/09/18  POCT glycosylated hemoglobin (Hb A1C)  Result Value Ref Range   Hemoglobin A1C 9.3 (A) 4.0 - 5.6 %   HbA1c POC (<> result, manual entry)     HbA1c, POC (prediabetic range)     HbA1c, POC (controlled diabetic range)     Assessment & Plan:  Asked ot bring [place] copy of labs he gets through [place] - states he had lipids and hep C screen done there.  Problem List Items Addressed This Visit    Uncontrolled type 2 diabetes with neuropathy (HCC) - Primary    Chronic, remains uncontrolled. With new year no longer able to afford jardiance although he felt it was significantly helping. Asked to price out SLGT2 alternatives and [facility] (has appt tomorrow). RTC 3 mo f/u visit. Pt agrees with plan.       Relevant Orders   POCT glycosylated hemoglobin (Hb A1C) (Completed)   Tinea pedis    Ongoing right sided. Using [facility] antifungal cream with benefit.       Hypertension, essential    Remains off ACEI.       Diabetic neuropathy (HCC)    Notes burning neuropathic pain at night  time however feels gabapentin may be causing pedal edema. Consider lyrica trial.           No orders of the defined types were placed in this encounter.  Orders Placed  This Encounter  Procedures  . POCT glycosylated hemoglobin (Hb A1C)    Follow up plan: Return in about 3 months (around 12/08/2018) for follow up visit.  [name], MD

## 2018-09-21 ENCOUNTER — Other Ambulatory Visit: Payer: Self-pay | Admitting: Family Medicine

## 2018-10-03 ENCOUNTER — Other Ambulatory Visit: Payer: Self-pay | Admitting: Family Medicine

## 2018-12-02 ENCOUNTER — Encounter: Payer: Self-pay | Admitting: Family Medicine

## 2018-12-02 ENCOUNTER — Ambulatory Visit (INDEPENDENT_AMBULATORY_CARE_PROVIDER_SITE_OTHER): Payer: 59 | Admitting: Family Medicine

## 2018-12-02 ENCOUNTER — Other Ambulatory Visit: Payer: Self-pay

## 2018-12-02 VITALS — BP 126/84 | HR 79 | Temp 97.9°F | Ht 73.0 in | Wt 297.4 lb

## 2018-12-02 DIAGNOSIS — M79644 Pain in right finger(s): Secondary | ICD-10-CM

## 2018-12-02 MED ORDER — NAPROXEN 500 MG PO TABS: ORAL_TABLET | ORAL | 0 refills | Status: DC

## 2018-12-02 NOTE — Patient Instructions (Signed)
I think you have trigger finger of the thumb.  Treat with anti inflammatory course - naprosyn 500mg  twice daily with meals sent to pharmacy for 1 week then as needed. Continue thumb splint.  Use ice to thumb (not directly on skin but covered with towel).  If not improving with this treatment, schedule follow up appointment with sports medicine Dr Patsy Lageropland.   Trigger Finger  Trigger finger (stenosing tenosynovitis) is a condition that causes a finger to get stuck in a bent position. Each finger has a tough, cord-like tissue that connects muscle to bone (tendon), and each tendon is surrounded by a tunnel of tissue (tendon sheath). To move your finger, your tendon needs to slide freely through the sheath. Trigger finger happens when the tendon or the sheath thickens, making it difficult to move your finger. Trigger finger can affect any finger or a thumb. It may affect more than one finger. Mild cases may clear up with rest and medicine. Severe cases require more treatment. What are the causes? Trigger finger is caused by a thickened finger tendon or tendon sheath. The cause of this thickening is not known. What increases the risk? The following factors may make you more likely to develop this condition:  Doing activities that require a strong grip.  Having rheumatoid arthritis, gout, or diabetes.  Being 4440-55 years old.  Being a woman. What are the signs or symptoms? Symptoms of this condition include:  Pain when bending or straightening your finger.  Tenderness or swelling where your finger attaches to the palm of your hand.  A lump in the palm of your hand or on the inside of your finger.  Hearing a popping sound when you try to straighten your finger.  Feeling a popping, catching, or locking sensation when you try to straighten your finger.  Being unable to straighten your finger. How is this diagnosed? This condition is diagnosed based on your symptoms and a physical exam. How  is this treated? This condition may be treated by:  Resting your finger and avoiding activities that make symptoms worse.  Wearing a finger splint to keep your finger in a slightly bent position.  Taking NSAIDs to relieve pain and swelling.  Injecting medicine (steroids) into the tendon sheath to reduce swelling and irritation. Injections may need to be repeated.  Having surgery to open the tendon sheath. This may be done if other treatments do not work and you cannot straighten your finger. You may need physical therapy after surgery. Follow these instructions at home:   Use moist heat to help reduce pain and swelling as told by your health care provider.  Rest your finger and avoid activities that make pain worse. Return to normal activities as told by your health care provider.  If you have a splint, wear it as told by your health care provider.  Take over-the-counter and prescription medicines only as told by your health care provider.  Keep all follow-up visits as told by your health care provider. This is important. Contact a health care provider if:  Your symptoms are not improving with home care. Summary  Trigger finger (stenosing tenosynovitis) causes your finger to get stuck in a bent position, and it can make it difficult and painful to straighten your finger.  This condition develops when a finger tendon or tendon sheath thickens.  Treatment starts with resting, wearing a splint, and taking NSAIDs.  In severe cases, surgery to open the tendon sheath may be needed. This information is not  intended to replace advice given to you by your health care provider. Make sure you discuss any questions you have with your health care provider. Document Released: 04/22/2004 Document Revised: 06/13/2016 Document Reviewed: 06/13/2016 Elsevier Interactive Patient Education  2019 [employer].

## 2018-12-02 NOTE — Progress Notes (Signed)
This visit was conducted in person.  BP 126/84 (BP Location: Left Arm, Patient Position: Sitting, Cuff Size: Large)   Pulse 79   Temp 97.9 F (36.6 C) (Oral)   Ht 6\' 1"  (1.854 m)   Wt 297 lb 6 oz (134.9 kg)   SpO2 96%   BMI 39.23 kg/m    CC: thumb pain on right Subjective:    Patient ID: Verdis Warfield, male    DOB: 05-30-1966, 53 y.o.   MRN: [id number]  HPI: Amaar Henckel is a 53 y.o. male presenting on 12/02/2018 for Hand Pain (C/o right thumb pain. Pain started 2.5-3 wks ago. Thinks it is a sprain. )   2-3 wk h/o R thumb pain initially more swelling with limited ROM/flexion at IP and MCP joint. [facility]ing [facility] with benefit. Seems to be improving slowly. Took advil 400mg  last night as well with benefit.   No redness/warmth of the joint.  Denies inciting trauma/injury.  No known h/o gout.   Has been exercising more doing push ups - wonders if sprained thumb.   Known diabetic - endorses improving glycemic control. Not yet due for rpt A1c. Lab Results  Component Value Date   HGBA1C 9.3 (A) 09/09/2018       Relevant past medical, surgical, family and social history reviewed and updated as indicated. Interim medical history since our last visit reviewed. Allergies and medications reviewed and updated. Outpatient Medications Prior to Visit  Medication Sig Dispense Refill  . atorvastatin (LIPITOR) 10 MG tablet Take 1 tablet (10 mg total) by mouth daily at 6 PM. 90 tablet 3  . empagliflozin (JARDIANCE) 10 MG TABS tablet Take 10 mg by mouth daily. 30 tablet 6  . gabapentin (NEURONTIN) 300 MG capsule Take 1 capsule (300 mg total) by mouth 2 (two) times daily. 180 capsule 3  . glipiZIDE (GLUCOTROL) 10 MG tablet TAKE 1 TABLET (10 MG TOTAL) BY MOUTH 2 (TWO) TIMES DAILY BEFORE LUNCH AND SUPPER. 60 tablet 3  . glucose blood (ONE TOUCH ULTRA TEST) test strip Check blood sugar twice a day and as instructed. Dx 250.00 100 each 3  . meloxicam (MOBIC) 15 MG tablet Take 1  tablet (15 mg total) by mouth daily. 30 tablet 3  . metFORMIN (GLUCOPHAGE) 500 MG tablet TAKE 1 TABLET BY MOUTH TWICE A DAY WITH A MEAL 180 tablet 0  . sildenafil (VIAGRA) 100 MG tablet Take 0.5-1 tablets (50-100 mg total) by mouth daily as needed for erectile dysfunction. 5 tablet 11  . STENDRA 100 MG TABS TAKE 1/2 - 1 TABLET (50-100 MG) BY MOUTH DAILY AS NEEDED. 10 tablet 1   No facility-administered medications prior to visit.      Per HPI unless specifically indicated in ROS section below Review of Systems Objective:    BP 126/84 (BP Location: Left Arm, Patient Position: Sitting, Cuff Size: Large)   Pulse 79   Temp 97.9 F (36.6 C) (Oral)   Ht 6\' 1"  (1.854 m)   Wt 297 lb 6 oz (134.9 kg)   SpO2 96%   BMI 39.23 kg/m   Wt Readings from Last 3 Encounters:  12/02/18 297 lb 6 oz (134.9 kg)  09/09/18 (!) 310 lb 6 oz (140.8 kg)  06/03/18 (!) 310 lb 12 oz (141 kg)    Physical Exam Vitals signs and nursing note reviewed.  Constitutional:      General: He is not in acute distress.    Appearance: Normal appearance. He is not ill-appearing.  Musculoskeletal: Normal range of motion.        General: Swelling and tenderness present. No deformity or signs of injury.     Comments: Mild swelling R thumb at IP and MCP, triggering with flexion at both joints. Tender to palpation at palmar and dorsal MCP and palmar IP of R thumb. No [facility] at [facility] or at anatomical snuff box. No redness or warmth of thumb. No pain or laxity with UCL testing L thumb/wrist WNL 2+ rad pulses bilaterally  Skin:    General: Skin is warm and dry.     Capillary Refill: Capillary refill takes less than 2 seconds.     Findings: No erythema or rash.  Neurological:     Mental Status: He is alert.     Comments: Bilateral hands neurovascularly intact  Psychiatric:        Mood and Affect: Mood normal.        Behavior: Behavior normal.       Assessment & Plan:   Problem List Items Addressed This Visit    Pain of right  thumb - Primary    Anticipate trigger thumb on right. Discussed pathophysiology of stenosing tenosynovitis. Thumb splint seems to be helping - ok to continue. Rx naprosyn course, rec ice to thumb, schedule appt with Dr Patsy [place] if not continuing to improve over next few days.           Meds ordered this encounter  Medications  . naproxen (NAPROSYN) 500 MG tablet    Sig: Take one po bid x 1 week then prn pain, take with food    Dispense:  30 tablet    Refill:  0   No orders of the defined types were placed in this encounter.   Follow up plan: Return if symptoms worsen or fail to improve.  Eustaquio BoydenJavier Blaze Sandin, MD

## 2018-12-02 NOTE — Assessment & Plan Note (Signed)
Anticipate trigger thumb on right. Discussed pathophysiology of stenosing tenosynovitis. Thumb splint seems to be helping - ok to continue. Rx naprosyn course, rec ice to thumb, schedule appt with Dr Patsy Lager if not continuing to improve over next few days.

## 2018-12-10 ENCOUNTER — Other Ambulatory Visit: Payer: Self-pay | Admitting: Family Medicine

## 2018-12-10 ENCOUNTER — Ambulatory Visit: Payer: Non-veteran care | Admitting: Family Medicine

## 2018-12-10 DIAGNOSIS — Z1159 Encounter for screening for other viral diseases: Secondary | ICD-10-CM

## 2018-12-10 DIAGNOSIS — IMO0002 Reserved for concepts with insufficient information to code with codable children: Secondary | ICD-10-CM

## 2018-12-10 DIAGNOSIS — E1140 Type 2 diabetes mellitus with diabetic neuropathy, unspecified: Secondary | ICD-10-CM

## 2018-12-10 DIAGNOSIS — E785 Hyperlipidemia, unspecified: Secondary | ICD-10-CM

## 2018-12-10 DIAGNOSIS — Z125 Encounter for screening for malignant neoplasm of prostate: Secondary | ICD-10-CM

## 2018-12-11 ENCOUNTER — Other Ambulatory Visit: Payer: 59

## 2018-12-14 ENCOUNTER — Other Ambulatory Visit: Payer: Self-pay | Admitting: Family Medicine

## 2018-12-16 NOTE — Telephone Encounter (Signed)
Electronic refill request Naproxen Last refill 12/02/18 #30 Last office visit 12/02/18

## 2018-12-18 ENCOUNTER — Ambulatory Visit: Payer: 59 | Admitting: Family Medicine

## 2018-12-19 ENCOUNTER — Other Ambulatory Visit: Payer: Self-pay | Admitting: Family Medicine

## 2018-12-19 NOTE — Telephone Encounter (Signed)
Pls r/s 3 mo in office DM f/u and curbside lab visit.  Then send back to me to send refill.

## 2018-12-19 NOTE — Telephone Encounter (Signed)
Pt is rescheduled for 12/20/18 for labs and 12/23/18 for dr visit. He said he can only do 3:30. He said he tried coming to last appointment but we couldn't get off early.

## 2018-12-20 ENCOUNTER — Other Ambulatory Visit: Payer: Self-pay

## 2018-12-20 ENCOUNTER — Other Ambulatory Visit (INDEPENDENT_AMBULATORY_CARE_PROVIDER_SITE_OTHER): Payer: 59

## 2018-12-20 DIAGNOSIS — E1165 Type 2 diabetes mellitus with hyperglycemia: Secondary | ICD-10-CM | POA: Diagnosis not present

## 2018-12-20 DIAGNOSIS — Z125 Encounter for screening for malignant neoplasm of prostate: Secondary | ICD-10-CM | POA: Diagnosis not present

## 2018-12-20 DIAGNOSIS — IMO0002 Reserved for concepts with insufficient information to code with codable children: Secondary | ICD-10-CM

## 2018-12-20 DIAGNOSIS — E785 Hyperlipidemia, unspecified: Secondary | ICD-10-CM | POA: Diagnosis not present

## 2018-12-20 DIAGNOSIS — Z1159 Encounter for screening for other viral diseases: Secondary | ICD-10-CM

## 2018-12-20 DIAGNOSIS — E1140 Type 2 diabetes mellitus with diabetic neuropathy, unspecified: Secondary | ICD-10-CM

## 2018-12-20 LAB — LIPID PANEL
Cholesterol: 233 mg/dL — ABNORMAL HIGH (ref 0–200)
HDL: 33.7 mg/dL — ABNORMAL LOW (ref >39.00)
NonHDL: 199.59
Total CHOL/HDL Ratio: 7
Triglycerides: 313 mg/dL — ABNORMAL HIGH (ref 0.0–149.0)
VLDL: 62.6 mg/dL — ABNORMAL HIGH (ref 0.0–40.0)

## 2018-12-20 LAB — COMPREHENSIVE METABOLIC PANEL
ALT: 28 U/L (ref 0–53)
AST: 18 U/L (ref 0–37)
Albumin: 4.2 g/dL (ref 3.5–5.2)
Alkaline Phosphatase: 57 U/L (ref 39–117)
BUN: 15 mg/dL (ref 6–23)
CO2: 25 mEq/L (ref 19–32)
Calcium: 9.4 mg/dL (ref 8.4–10.5)
Chloride: 103 mEq/L (ref 96–112)
Creatinine, Ser: 1.01 mg/dL (ref 0.40–1.50)
GFR: 92.7 mL/min (ref >60.00)
Glucose, Bld: 194 mg/dL — ABNORMAL HIGH (ref 70–99)
Potassium: 4.2 mEq/L (ref 3.5–5.1)
Sodium: 138 mEq/L (ref 135–145)
Total Bilirubin: 0.4 mg/dL (ref 0.2–1.2)
Total Protein: 7.1 g/dL (ref 6.0–8.3)

## 2018-12-20 LAB — LDL CHOLESTEROL, DIRECT: Direct LDL: 95 mg/dL

## 2018-12-20 LAB — PSA: PSA: 1.47 ng/mL (ref 0.10–4.00)

## 2018-12-20 LAB — HEMOGLOBIN A1C: Hgb A1c MFr Bld: 8.7 % — ABNORMAL HIGH (ref 4.6–6.5)

## 2018-12-23 ENCOUNTER — Other Ambulatory Visit: Payer: Self-pay

## 2018-12-23 ENCOUNTER — Encounter: Payer: Self-pay | Admitting: Family Medicine

## 2018-12-23 ENCOUNTER — Ambulatory Visit (INDEPENDENT_AMBULATORY_CARE_PROVIDER_SITE_OTHER): Payer: 59 | Admitting: Family Medicine

## 2018-12-23 VITALS — BP 122/80 | HR 96 | Temp 98.1°F | Ht 73.0 in | Wt 300.0 lb

## 2018-12-23 DIAGNOSIS — E1140 Type 2 diabetes mellitus with diabetic neuropathy, unspecified: Secondary | ICD-10-CM | POA: Diagnosis not present

## 2018-12-23 DIAGNOSIS — IMO0002 Reserved for concepts with insufficient information to code with codable children: Secondary | ICD-10-CM

## 2018-12-23 DIAGNOSIS — E1165 Type 2 diabetes mellitus with hyperglycemia: Secondary | ICD-10-CM

## 2018-12-23 DIAGNOSIS — E782 Mixed hyperlipidemia: Secondary | ICD-10-CM

## 2018-12-23 DIAGNOSIS — Z6835 Body mass index (BMI) 35.0-35.9, adult: Secondary | ICD-10-CM

## 2018-12-23 LAB — HEPATITIS C ANTIBODY
Hepatitis C Ab: NONREACTIVE
SIGNAL TO CUT-OFF: 0.52 (ref <1.00)

## 2018-12-23 MED ORDER — EMPAGLIFLOZIN 25 MG PO TABS: 25.0000 mg | ORAL_TABLET | Freq: Every day | ORAL | 6 refills | Status: DC

## 2018-12-23 NOTE — Patient Instructions (Addendum)
Schedule eye appointment.  Continue exercise routine for goal sustainable weight loss.  Increase jardiance to 25mg  daily - new dose sent to pharmacy.  Return in 3-4 months for follow up diabetes visit.

## 2018-12-23 NOTE — Assessment & Plan Note (Signed)
Continue to encourage efforts at sustainable weight loss

## 2018-12-23 NOTE — Progress Notes (Signed)
This visit was conducted in person.  BP 122/80   Pulse 96   Temp 98.1 F (36.7 C) (Oral)   Ht 6\' 1"  (1.854 m)   Wt 300 lb (136.1 kg)   SpO2 97%   BMI 39.58 kg/m    CC: 3 mo DM f/u visit Subjective:    Patient ID: Jonathon DowdyOrin [name], male    DOB: 01/18/1966, 53 y.o.   MRN: [id number]  HPI: Jonathon DowdyOrin Villavicencio is a 53 y.o. male presenting on 12/23/2018 for Follow-up (3 mo follow up--)   Annual exams through the [facility]  DM - does not regularly (twice weekly) check sugars fasting 130, PM 175-200. Compliant with antihyperglycemic regimen which includes: glipizide 10mg  bid, metformin 500mg  bid, jardiance 10mg . No yeast infections, UT. Denies low sugars or hypoglycemic symptoms. Denies paresthesias. Last diabetic eye exam overdue. Pneumovax: DUE. Prevnar: not due. Glucometer brand: one-touch. DSME: completed through the [facility]. Lab Results  Component Value Date   HGBA1C 8.7 (H) 12/20/2018   Diabetic Foot Exam - Simple   No data filed     Lab Results  Component Value Date   MICROALBUR 0.9 06/03/2018         Relevant past medical, surgical, family and social history reviewed and updated as indicated. Interim medical history since our last visit reviewed. Allergies and medications reviewed and updated. Outpatient Medications Prior to Visit  Medication Sig Dispense Refill  . atorvastatin (LIPITOR) 10 MG tablet Take 1 tablet (10 mg total) by mouth daily at 6 PM. 90 tablet 3  . gabapentin (NEURONTIN) 300 MG capsule Take 1 capsule (300 mg total) by mouth 2 (two) times daily. 180 capsule 3  . glipiZIDE (GLUCOTROL) 10 MG tablet TAKE 1 TABLET (10 MG TOTAL) BY MOUTH 2 (TWO) TIMES DAILY BEFORE LUNCH AND SUPPER. 60 tablet 3  . glucose blood (ONE TOUCH ULTRA TEST) test strip Check blood sugar twice a day and as instructed. Dx 250.00 100 each 3  . metFORMIN (GLUCOPHAGE) 500 MG tablet TAKE 1 TABLET BY MOUTH TWICE A DAY WITH MEALS 180 tablet 0  . naproxen (NAPROSYN) 500 MG tablet TAKE ONE TABLET BY MOUTH TWICE DAILY  X1 WEEK THEN AS NEEDED FOR PAIN 30 tablet 0  . sildenafil (VIAGRA) 100 MG tablet Take 0.5-1 tablets (50-100 mg total) by mouth daily as needed for erectile dysfunction. 5 tablet 11  . empagliflozin (JARDIANCE) 10 MG TABS tablet Take 10 mg by mouth daily. 30 tablet 6  . meloxicam (MOBIC) 15 MG tablet Take 1 tablet (15 mg total) by mouth daily. (Patient not taking: Reported on 12/23/2018) 30 tablet 3   No facility-administered medications prior to visit.      Per HPI unless specifically indicated in ROS section below Review of Systems Objective:    BP 122/80   Pulse 96   Temp 98.1 F (36.7 C) (Oral)   Ht 6\' 1"  (1.854 m)   Wt 300 lb (136.1 kg)   SpO2 97%   BMI 39.58 kg/m   Wt Readings from Last 3 Encounters:  12/23/18 300 lb (136.1 kg)  12/02/18 297 lb 6 oz (134.9 kg)  09/09/18 (!) 310 lb 6 oz (140.8 kg)    Physical Exam Vitals signs and nursing note reviewed.  Constitutional:      General: He is not in acute distress.    Appearance: He is well-developed.  HENT:     Head: Normocephalic and atraumatic.     Right Ear: External ear normal.     Left Ear:  External ear normal.     Nose: Nose normal.     Mouth/Throat:     Pharynx: No oropharyngeal exudate.  Eyes:     General: No scleral icterus.    Conjunctiva/sclera: Conjunctivae normal.     Pupils: Pupils are equal, round, and reactive to light.  Neck:     Musculoskeletal: Normal range of motion and neck supple.  Cardiovascular:     Rate and Rhythm: Normal rate and regular rhythm.     Heart sounds: Normal heart sounds. No murmur.  Pulmonary:     Effort: Pulmonary effort is normal. No respiratory distress.     Breath sounds: Normal breath sounds. No wheezing or rales.  Musculoskeletal:     Comments: See HPI for foot exam if done  Lymphadenopathy:     Cervical: No cervical adenopathy.  Skin:    General: Skin is warm and dry.     Findings: No rash.       Results for orders placed or performed in visit on 12/20/18  PSA   Result Value Ref Range   PSA 1.47 0.10 - 4.00 ng/mL  Hemoglobin A1c  Result Value Ref Range   Hgb A1c MFr Bld 8.7 (H) 4.6 - 6.5 %  Lipid panel  Result Value Ref Range   Cholesterol 233 (H) 0 - 200 mg/dL   Triglycerides 313.0 (H) 0.0 - 149.0 mg/dL   HDL 33.70 (L) >39.00 mg/dL   VLDL 62.6 (H) 0.0 - 40.0 mg/dL   Total CHOL/HDL Ratio 7    NonHDL 199.59   Comprehensive metabolic panel  Result Value Ref Range   Sodium 138 135 - 145 mEq/L   Potassium 4.2 3.5 - 5.1 mEq/L   Chloride 103 96 - 112 mEq/L   CO2 25 19 - 32 mEq/L   Glucose, Bld 194 (H) 70 - 99 mg/dL   BUN 15 6 - 23 mg/dL   Creatinine, Ser 1.01 0.40 - 1.50 mg/dL   Total Bilirubin 0.4 0.2 - 1.2 mg/dL   Alkaline Phosphatase 57 39 - 117 U/L   AST 18 0 - 37 U/L   ALT 28 0 - 53 U/L   Total Protein 7.1 6.0 - 8.3 g/dL   Albumin 4.2 3.5 - 5.2 g/dL   Calcium 9.4 8.4 - 10.5 mg/dL   GFR 92.70 >60.00 mL/min  LDL cholesterol, direct  Result Value Ref Range   Direct LDL 95.0 mg/dL   Assessment & Plan:   Problem List Items Addressed This Visit    Uncontrolled type 2 diabetes with neuropathy ([place]) - Primary    Chronic, slowly improving. Encouraged renewed efforts at diabetic diet and regular exercise routine. Will increase jardiance to 25mg  daily, continue glipizide and metformin. Encouraged he schedule eye exam as overdue. He is satisfied with jardiance - denies recent UTI, yeast infection, or signs of groin skin infection.      Relevant Medications   empagliflozin (JARDIANCE) 25 MG TABS tablet   Severe obesity (BMI 35.0-35.9 with comorbidity) ([place])    Continue to encourage efforts at sustainable weight loss      Relevant Medications   empagliflozin (JARDIANCE) 25 MG TABS tablet   HLD (hyperlipidemia)    Chronic, on lipitor 10mg  daily. Consider increasing dose to better control triglycerides.           Meds ordered this encounter  Medications  . empagliflozin (JARDIANCE) 25 MG TABS tablet    Sig: Take 25 mg by mouth  daily.    Dispense:  30 tablet  Refill:  6   No orders of the defined types were placed in this encounter.   Follow up plan: Return in about 3 months (around 03/25/2019) for follow up visit.  Jonathon BoydenJavier Mesiah Manzo, MD

## 2018-12-23 NOTE — Assessment & Plan Note (Addendum)
Chronic, slowly improving. Encouraged renewed efforts at diabetic diet and regular exercise routine. Will increase jardiance to 25mg  daily, continue glipizide and metformin. Encouraged he schedule eye exam as overdue. He is satisfied with jardiance - denies recent UTI, yeast infection, or signs of groin skin infection.

## 2018-12-23 NOTE — Assessment & Plan Note (Signed)
Chronic, on lipitor 10mg  daily. Consider increasing dose to better control triglycerides.

## 2018-12-26 ENCOUNTER — Other Ambulatory Visit: Payer: Self-pay | Admitting: Family Medicine

## 2018-12-31 ENCOUNTER — Other Ambulatory Visit: Payer: Self-pay | Admitting: Family Medicine

## 2018-12-31 NOTE — Telephone Encounter (Signed)
Patient left a voicemail stating that he has been trying to get a refill on his Viagra since last week. Last office visit 12/23/18 Last refill 12/18//19 #5/11

## 2019-01-04 ENCOUNTER — Other Ambulatory Visit: Payer: Self-pay | Admitting: Family Medicine

## 2019-01-11 ENCOUNTER — Other Ambulatory Visit: Payer: Self-pay | Admitting: Family Medicine

## 2019-02-14 ENCOUNTER — Other Ambulatory Visit: Payer: Self-pay | Admitting: Family Medicine

## 2019-03-20 ENCOUNTER — Telehealth: Payer: Self-pay | Admitting: Family Medicine

## 2019-03-20 MED ORDER — GLUCOSE BLOOD VI STRP: ORAL_STRIP | 3 refills | Status: DC

## 2019-03-20 NOTE — Telephone Encounter (Signed)
Pt wife Jonathon [name] calling to request test strips be sent in to [facility].  Pt has been out of strips for a while now and kept forgetting to call which is why the wife is calling.   Please call back once this has been sent in. Thanks.

## 2019-03-20 NOTE — Telephone Encounter (Signed)
E-scribed refill 

## 2019-03-29 ENCOUNTER — Other Ambulatory Visit: Payer: Self-pay | Admitting: Family Medicine

## 2019-03-31 ENCOUNTER — Other Ambulatory Visit: Payer: Self-pay | Admitting: Family Medicine

## 2019-04-01 ENCOUNTER — Other Ambulatory Visit: Payer: Self-pay | Admitting: Family Medicine

## 2019-04-01 ENCOUNTER — Other Ambulatory Visit: Payer: Self-pay

## 2019-04-01 NOTE — Telephone Encounter (Signed)
[place] Night - Client TELEPHONE ADVICE RECORD [facility] Patient Name: Jonathon [name] Gender: Male DOB: [birth year] Age: 53 Y 18 M 20 D Return Phone Number: [phone] (Primary), [phone] (Secondary) Address: [address] City/State/Zip: [place] [address] Client [facility] Night - Client Client Site [place] - Night Contact Type Call Who Is Calling Patient / Member / Family / Caregiver Call Type Triage / Clinical Relationship To Patient Self Return Phone Number [phone] (Primary) Chief Complaint Prescription Refill or Medication Request (non symptomatic) Reason for Call Symptomatic / Request for [place] states he is still waiting for his script to be filled. Translation No Nurse Assessment Guidelines Guideline Title Affirmed Question Affirmed Notes Nurse Date/Time (Eastern Time) Disp. Time Eilene Ghazi Time) Disposition Final User 03/31/2019 6:24:07 PM Send To RN Personal Junius Creamer, RN, Debra 03/31/2019 6:48:40 PM Attempt made - no message left [place], RN, Lonn Georgia 03/31/2019 7:03:03 PM FINAL ATTEMPT MADE - message left Yes Norwood, RN, Lonn Georgia

## 2019-04-01 NOTE — Telephone Encounter (Signed)
Last OV:  12/23/18, acute Next OV:   04/24/19, CPE

## 2019-04-02 NOTE — Telephone Encounter (Signed)
Refills for Clotrimazole 1% cream and Glipizide 10 mg was sent to his pharmacy on 04/01/2019. Sildenafil was denied because it was refilled on 02/17/2019 for #5 tablets with 4 refills.  He should have refills available at his pharamacy.

## 2019-04-03 MED ORDER — SILDENAFIL CITRATE 100 MG PO TABS: 50.0000 mg | ORAL_TABLET | Freq: Every day | ORAL | 0 refills | Status: DC | PRN

## 2019-04-03 NOTE — Addendum Note (Signed)
Addended by: Helene Shoe on: 04/03/2019 10:04 AM   Modules accepted: Orders

## 2019-04-03 NOTE — Telephone Encounter (Signed)
Pt left v/m requesting refill viagra to [facility]. I spoke with Evonne at [facility] and pt does not have available refills on viagra 100 mg. Pt last refilled viagra 100 mg # 5 on 03/23/19.Please advise.

## 2019-04-10 ENCOUNTER — Other Ambulatory Visit: Payer: Self-pay | Admitting: Family Medicine

## 2019-04-11 NOTE — Telephone Encounter (Signed)
LOV 12/23/2018 for follow up Next appointment on 04/24/2019 for CPE  Naproxen last filled on 01/13/2019 #30, 0 refill Sildenafil last filled on 04/03/2019 #5 with 0 refill.

## 2019-04-17 ENCOUNTER — Other Ambulatory Visit: Payer: Self-pay | Admitting: Family Medicine

## 2019-04-17 DIAGNOSIS — I10 Essential (primary) hypertension: Secondary | ICD-10-CM

## 2019-04-17 DIAGNOSIS — Z8042 Family history of malignant neoplasm of prostate: Secondary | ICD-10-CM

## 2019-04-17 DIAGNOSIS — E1140 Type 2 diabetes mellitus with diabetic neuropathy, unspecified: Secondary | ICD-10-CM

## 2019-04-17 DIAGNOSIS — E782 Mixed hyperlipidemia: Secondary | ICD-10-CM

## 2019-04-17 DIAGNOSIS — IMO0002 Reserved for concepts with insufficient information to code with codable children: Secondary | ICD-10-CM

## 2019-04-18 ENCOUNTER — Other Ambulatory Visit (INDEPENDENT_AMBULATORY_CARE_PROVIDER_SITE_OTHER): Payer: 59

## 2019-04-18 ENCOUNTER — Other Ambulatory Visit: Payer: Self-pay

## 2019-04-18 DIAGNOSIS — I10 Essential (primary) hypertension: Secondary | ICD-10-CM

## 2019-04-18 DIAGNOSIS — Z8042 Family history of malignant neoplasm of prostate: Secondary | ICD-10-CM

## 2019-04-18 DIAGNOSIS — E1165 Type 2 diabetes mellitus with hyperglycemia: Secondary | ICD-10-CM

## 2019-04-18 DIAGNOSIS — E1140 Type 2 diabetes mellitus with diabetic neuropathy, unspecified: Secondary | ICD-10-CM | POA: Diagnosis not present

## 2019-04-18 DIAGNOSIS — IMO0002 Reserved for concepts with insufficient information to code with codable children: Secondary | ICD-10-CM

## 2019-04-18 DIAGNOSIS — E782 Mixed hyperlipidemia: Secondary | ICD-10-CM | POA: Diagnosis not present

## 2019-04-18 LAB — MICROALBUMIN / CREATININE URINE RATIO
Creatinine,U: 88.1 mg/dL
Microalb Creat Ratio: 0.8 mg/g (ref 0.0–30.0)
Microalb, Ur: <0.7 mg/dL (ref 0.0–1.9)

## 2019-04-18 LAB — COMPREHENSIVE METABOLIC PANEL
ALT: 23 U/L (ref 0–53)
AST: 14 U/L (ref 0–37)
Albumin: 4.3 g/dL (ref 3.5–5.2)
Alkaline Phosphatase: 65 U/L (ref 39–117)
BUN: 18 mg/dL (ref 6–23)
CO2: 28 mEq/L (ref 19–32)
Calcium: 10.1 mg/dL (ref 8.4–10.5)
Chloride: 103 mEq/L (ref 96–112)
Creatinine, Ser: 1.19 mg/dL (ref 0.40–1.50)
GFR: 76.63 mL/min (ref >60.00)
Glucose, Bld: 148 mg/dL — ABNORMAL HIGH (ref 70–99)
Potassium: 4.9 mEq/L (ref 3.5–5.1)
Sodium: 142 mEq/L (ref 135–145)
Total Bilirubin: 0.5 mg/dL (ref 0.2–1.2)
Total Protein: 7 g/dL (ref 6.0–8.3)

## 2019-04-18 LAB — PSA: PSA: 1.2 ng/mL (ref 0.10–4.00)

## 2019-04-18 LAB — HEMOGLOBIN A1C: Hgb A1c MFr Bld: 8.8 % — ABNORMAL HIGH (ref 4.6–6.5)

## 2019-04-18 LAB — LIPID PANEL
Cholesterol: 251 mg/dL — ABNORMAL HIGH (ref 0–200)
HDL: 30.2 mg/dL — ABNORMAL LOW (ref >39.00)
Total CHOL/HDL Ratio: 8
Triglycerides: 566 mg/dL — ABNORMAL HIGH (ref 0.0–149.0)

## 2019-04-18 LAB — LDL CHOLESTEROL, DIRECT: Direct LDL: 77 mg/dL

## 2019-04-18 NOTE — Addendum Note (Signed)
Addended by: Ellamae Sia on: 04/18/2019 09:16 AM   Modules accepted: Orders

## 2019-04-24 ENCOUNTER — Encounter: Payer: Non-veteran care | Admitting: Family Medicine

## 2019-04-24 DIAGNOSIS — Z0289 Encounter for other administrative examinations: Secondary | ICD-10-CM

## 2019-05-07 ENCOUNTER — Other Ambulatory Visit: Payer: Self-pay | Admitting: Family Medicine

## 2019-05-07 NOTE — Telephone Encounter (Signed)
Naproxen Last filled: 04/11/19, #30 Last OV:  12/23/18, f/u Next OV:  05/30/19, CPE

## 2019-05-23 ENCOUNTER — Other Ambulatory Visit: Payer: Self-pay | Admitting: Family Medicine

## 2019-05-28 ENCOUNTER — Other Ambulatory Visit: Payer: Self-pay | Admitting: Family Medicine

## 2019-05-30 ENCOUNTER — Encounter: Payer: Self-pay | Admitting: Family Medicine

## 2019-05-30 ENCOUNTER — Other Ambulatory Visit: Payer: Self-pay

## 2019-05-30 ENCOUNTER — Ambulatory Visit (INDEPENDENT_AMBULATORY_CARE_PROVIDER_SITE_OTHER): Payer: 59 | Admitting: Family Medicine

## 2019-05-30 VITALS — BP 140/78 | HR 102 | Temp 98.6°F | Ht 71.5 in | Wt 299.4 lb

## 2019-05-30 DIAGNOSIS — Z0000 Encounter for general adult medical examination without abnormal findings: Secondary | ICD-10-CM | POA: Diagnosis not present

## 2019-05-30 DIAGNOSIS — E1142 Type 2 diabetes mellitus with diabetic polyneuropathy: Secondary | ICD-10-CM

## 2019-05-30 DIAGNOSIS — E1165 Type 2 diabetes mellitus with hyperglycemia: Secondary | ICD-10-CM

## 2019-05-30 DIAGNOSIS — E1169 Type 2 diabetes mellitus with other specified complication: Secondary | ICD-10-CM

## 2019-05-30 DIAGNOSIS — IMO0002 Reserved for concepts with insufficient information to code with codable children: Secondary | ICD-10-CM

## 2019-05-30 DIAGNOSIS — I10 Essential (primary) hypertension: Secondary | ICD-10-CM

## 2019-05-30 DIAGNOSIS — Z8042 Family history of malignant neoplasm of prostate: Secondary | ICD-10-CM

## 2019-05-30 DIAGNOSIS — Z23 Encounter for immunization: Secondary | ICD-10-CM

## 2019-05-30 DIAGNOSIS — E785 Hyperlipidemia, unspecified: Secondary | ICD-10-CM

## 2019-05-30 DIAGNOSIS — E118 Type 2 diabetes mellitus with unspecified complications: Secondary | ICD-10-CM

## 2019-05-30 DIAGNOSIS — Z0001 Encounter for general adult medical examination with abnormal findings: Secondary | ICD-10-CM | POA: Insufficient documentation

## 2019-05-30 MED ORDER — OZEMPIC (0.25 OR 0.5 MG/DOSE) 2 MG/1.5ML ~~LOC~~ SOPN: PEN_INJECTOR | SUBCUTANEOUS | 6 refills | Status: AC

## 2019-05-30 MED ORDER — ATORVASTATIN CALCIUM 40 MG PO TABS: 40.0000 mg | ORAL_TABLET | Freq: Every day | ORAL | 3 refills | Status: DC

## 2019-05-30 NOTE — Assessment & Plan Note (Signed)
Preventative protocols reviewed and updated unless pt declined. Discussed healthy diet and lifestyle.  

## 2019-05-30 NOTE — Assessment & Plan Note (Signed)
Chronic with LDL at goal, but trig too high - will increase atorvastatin to 40mg  daily. The 10-year ASCVD risk score Mikey Bussing DC Brooke Bonito., et al., 2013) is: 17.6%   Values used to calculate the score:     Age: 53 years     Sex: Male     Is Non-Hispanic African American: Yes     Diabetic: Yes     Tobacco smoker: No     Systolic Blood Pressure: 697 mmHg     Is BP treated: No     HDL Cholesterol: 30.2 mg/dL     Total Cholesterol: 251 mg/dL

## 2019-05-30 NOTE — Patient Instructions (Addendum)
Pneumovax today Schedule eye exam  Bring me copy of colonoscopy report at your convenience.  Try ozempic 0.25mg  weekly for 2 weeks then increase to 0.5mg  weekly. This was sent to local pharmacy Increase atorvastatin to 40mg  daily - new dose at pharmacy. Return in 3-4 months for diabetes follow up visit.   Health Maintenance, Male Adopting a healthy lifestyle and getting preventive care are important in promoting health and wellness. Ask your health care provider about:  The right schedule for you to have regular tests and exams.  Things you can do on your own to prevent diseases and keep yourself healthy. What should I know about diet, weight, and exercise? Eat a healthy diet   Eat a diet that includes plenty of vegetables, fruits, low-fat dairy products, and lean protein.  Do not eat a lot of foods that are high in solid fats, added sugars, or sodium. Maintain a healthy weight Body mass index (BMI) is a measurement that can be used to identify possible weight problems. It estimates body fat based on height and weight. Your health care provider can help determine your BMI and help you achieve or maintain a healthy weight. Get regular exercise Get regular exercise. This is one of the most important things you can do for your health. Most adults should:  Exercise for at least 150 minutes each week. The exercise should increase your heart rate and make you sweat (moderate-intensity exercise).  Do strengthening exercises at least twice a week. This is in addition to the moderate-intensity exercise.  Spend less time sitting. Even light physical activity can be beneficial. Watch cholesterol and blood lipids Have your blood tested for lipids and cholesterol at 55 years of age, then have this test every 5 years. You may need to have your cholesterol levels checked more often if:  Your lipid or cholesterol levels are high.  You are older than 55 years of age.  You are at high risk for  heart disease. What should I know about cancer screening? Many types of cancers can be detected early and may often be prevented. Depending on your health history and family history, you may need to have cancer screening at various ages. This may include screening for:  Colorectal cancer.  Prostate cancer.  Skin cancer.  Lung cancer. What should I know about heart disease, diabetes, and high blood pressure? Blood pressure and heart disease  High blood pressure causes heart disease and increases the risk of stroke. This is more likely to develop in people who have high blood pressure readings, are of African descent, or are overweight.  Talk with your health care provider about your target blood pressure readings.  Have your blood pressure checked: ? Every 3-5 years if you are 94-79 years of age. ? Every year if you are 27 years old or older.  If you are between the ages of 41 and 14 and are a current or former smoker, ask your health care provider if you should have a one-time screening for abdominal aortic aneurysm (AAA). Diabetes Have regular diabetes screenings. This checks your fasting blood sugar level. Have the screening done:  Once every three years after age 77 if you are at a normal weight and have a low risk for diabetes.  More often and at a younger age if you are overweight or have a high risk for diabetes. What should I know about preventing infection? Hepatitis B If you have a higher risk for hepatitis B, you should be screened  for this virus. Talk with your health care provider to find out if you are at risk for hepatitis B infection. Hepatitis C Blood testing is recommended for:  Everyone born from [birth year] through 1967.  Anyone with known risk factors for hepatitis C. Sexually transmitted infections (STIs)  You should be screened each year for STIs, including gonorrhea and chlamydia, if: ? You are sexually active and are younger than 55 years of age. ? You are  older than 55 years of age and your health care provider tells you that you are at risk for this type of infection. ? Your sexual activity has changed since you were last screened, and you are at increased risk for chlamydia or gonorrhea. Ask your health care provider if you are at risk.  Ask your health care provider about whether you are at high risk for HIV. Your health care provider may recommend a prescription medicine to help prevent HIV infection. If you choose to take medicine to prevent HIV, you should first get tested for HIV. You should then be tested every 3 months for as long as you are taking the medicine. Follow these instructions at home: Lifestyle  Do not use any products that contain nicotine or tobacco, such as cigarettes, e-cigarettes, and chewing tobacco. If you need help quitting, ask your health care provider.  Do not use street drugs.  Do not share needles.  Ask your health care provider for help if you need support or information about quitting drugs. Alcohol use  Do not drink alcohol if your health care provider tells you not to drink.  If you drink alcohol: ? Limit how much you have to 0-2 drinks a day. ? Be aware of how much alcohol is in your drink. In the [place] one drink equals one 12 oz bottle of beer (355 mL), one 5 oz glass of wine (148 mL), or one 1 oz glass of hard liquor (44 mL). General instructions  Schedule regular health, dental, and eye exams.  Stay current with your vaccines.  Tell your health care provider if: ? You often feel depressed. ? You have ever been abused or do not feel safe at home. Summary  Adopting a healthy lifestyle and getting preventive care are important in promoting health and wellness.  Follow your health care provider's instructions about healthy diet, exercising, and getting tested or screened for diseases.  Follow your health care provider's instructions on monitoring your cholesterol and blood pressure. This  information is not intended to replace advice given to you by your health care provider. Make sure you discuss any questions you have with your health care provider. Document Released: 12/30/2007 Document Revised: 06/26/2018 Document Reviewed: 06/26/2018 Elsevier Patient Education  2020 [employer].

## 2019-05-30 NOTE — Assessment & Plan Note (Signed)
Encouraged healthy diet and lifestyle changes to affect sustainable weight loss.  

## 2019-05-30 NOTE — Progress Notes (Signed)
This visit was conducted in person.  BP 140/78 (BP Location: Left Arm, Patient Position: Sitting, Cuff Size: Large)   Pulse (!) 102   Temp 98.6 F (37 C) (Temporal)   Ht 5' 11.5" (1.816 m)   Wt 299 lb 7 oz (135.8 kg)   SpO2 96%   BMI 41.18 kg/m    CC: CPE Subjective:    Patient ID: Jonathon [name], male    DOB: [birth year], 53 y.o.   MRN: [id number]  HPI: Jonathon [name] is a 53 y.o. male presenting on 05/30/2019 for Annual Exam   Gets yearly physical at [facility] - upcoming appt 07/2019.  Had liver ultrasound yesterday through [place]. Told has fatty liver. Results pending.   Preventative: COLONOSCOPY 02/14/2018 - at [facility] per patient - told normal Prostate cancer screening - no nocturia, strong stream. Will continue PSA and defer DRE Lung cancer screening - not eligible Flu shot - declines Tdap 2014 Pneumovax - today Seat belt use discussed Sunscreen use discussed. No changing moles on skin  Non smoker Alcohol - drinks <6 pack beer/wk Dentist yearly Eye exam overdue   Widower - wife suddenly passed away Nov 24, 2011. New fiancee. Has 5 children but none live at home. 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 Interim medical history since our last visit reviewed. Allergies and medications reviewed and updated. Outpatient Medications Prior to Visit  Medication Sig Dispense Refill  . clotrimazole (LOTRIMIN) 1 % cream APPLY TO AFFECTED AREA TWICE A DAY 60 g 1  . empagliflozin (JARDIANCE) 25 MG TABS tablet Take 25 mg by mouth daily. 30 tablet 6  . gabapentin (NEURONTIN) 300 MG capsule Take 1 capsule (300 mg total) by mouth 2 (two) times daily as needed.    Marland Kitchen glipiZIDE (GLUCOTROL) 10 MG tablet TAKE 1 TABLET (10 MG TOTAL) BY MOUTH 2 (TWO) TIMES DAILY BEFORE LUNCH AND SUPPER. 180 tablet 0  .  glucose blood (ONE TOUCH ULTRA TEST) test strip Check blood sugar twice a day and as instructed. Dx 250.00 100 each 3  . meloxicam (MOBIC) 15 MG tablet Take 1 tablet (15 mg total) by mouth daily. 30 tablet 3  . metFORMIN (GLUCOPHAGE) 500 MG tablet TAKE 1 TABLET BY MOUTH TWICE A DAY WITH MEALS 180 tablet 0  . naproxen (NAPROSYN) 500 MG tablet TAKE ONE TABLET BY MOUTH TWICE DAILY X1 WEEK THEN AS NEEDED FOR PAIN 30 tablet 0  . sildenafil (VIAGRA) 100 MG tablet TAKE 0.5-1 TABLETS (50-100 MG TOTAL) BY MOUTH DAILY AS NEEDED FOR ERECTILE DYSFUNCTION. 5 tablet 0  . atorvastatin (LIPITOR) 10 MG tablet Take 1 tablet (10 mg total) by mouth daily at 6 PM. 90 tablet 3  . gabapentin (NEURONTIN) 300 MG capsule Take 1 capsule (300 mg total) by mouth 2 (two) times daily. 180 capsule 3   No facility-administered medications prior to visit.      Per HPI unless specifically indicated in ROS section below Review of Systems  Constitutional: Negative for activity change, appetite change, chills, fatigue, fever and unexpected weight change.  HENT: Negative for hearing loss.   Eyes: Negative for visual disturbance.  Respiratory: Negative for cough, chest tightness, shortness of breath and wheezing.   Cardiovascular: Negative for chest pain, palpitations and leg swelling.  Gastrointestinal: Negative for abdominal distention, abdominal pain,  blood in stool, constipation, diarrhea, nausea and vomiting.  Genitourinary: Negative for difficulty urinating and hematuria.  Musculoskeletal: Negative for arthralgias, myalgias and neck pain.  Skin: Negative for rash.  Neurological: Negative for dizziness, seizures, syncope and headaches.  Hematological: Negative for adenopathy. Does not bruise/bleed easily.  Psychiatric/Behavioral: Negative for dysphoric mood. The patient is not nervous/anxious.    Objective:    BP 140/78 (BP Location: Left Arm, Patient Position: Sitting, Cuff Size: Large)   Pulse (!) 102   Temp 98.6 F  (37 C) (Temporal)   Ht 5' 11.5" (1.816 m)   Wt 299 lb 7 oz (135.8 kg)   SpO2 96%   BMI 41.18 kg/m   Wt Readings from Last 3 Encounters:  05/30/19 299 lb 7 oz (135.8 kg)  12/23/18 300 lb (136.1 kg)  12/02/18 297 lb 6 oz (134.9 kg)    Physical Exam Vitals signs and nursing note reviewed.  Constitutional:      General: He is not in acute distress.    Appearance: Normal appearance. He is well-developed. He is obese. He is not ill-appearing.  HENT:     Head: Normocephalic and atraumatic.     Right Ear: Hearing, tympanic membrane, ear canal and external ear normal.     Left Ear: Hearing, tympanic membrane, ear canal and external ear normal.     Nose: Nose normal.     Mouth/Throat:     Mouth: Mucous membranes are moist.     Pharynx: Oropharynx is clear. Uvula midline. No oropharyngeal exudate or posterior oropharyngeal erythema.  Eyes:     General: No scleral icterus.    Extraocular Movements: Extraocular movements intact.     Conjunctiva/sclera: Conjunctivae normal.     Pupils: Pupils are equal, round, and reactive to light.  Neck:     Musculoskeletal: Normal range of motion and neck supple.  Cardiovascular:     Rate and Rhythm: Normal rate and regular rhythm.     Pulses: Normal pulses.          Radial pulses are 2+ on the right side and 2+ on the left side.     Heart sounds: Normal heart sounds. No murmur.  Pulmonary:     Effort: Pulmonary effort is normal. No respiratory distress.     Breath sounds: Normal breath sounds. No wheezing, rhonchi or rales.  Abdominal:     General: Abdomen is flat. Bowel sounds are normal. There is no distension.     Palpations: Abdomen is soft. There is no mass.     Tenderness: There is no abdominal tenderness. There is no guarding or rebound.     Hernia: No hernia is present.  Musculoskeletal: Normal range of motion.     Right lower leg: No edema.     Left lower leg: No edema.  Lymphadenopathy:     Cervical: No cervical adenopathy.  Skin:     General: Skin is warm and dry.     Findings: No rash.  Neurological:     General: No focal deficit present.     Mental Status: He is alert and oriented to person, place, and time.     Comments: CN grossly intact, station and gait intact  Psychiatric:        Mood and Affect: Mood normal.        Behavior: Behavior normal.        Thought Content: Thought content normal.        Judgment: Judgment normal.  Results for orders placed or performed in visit on 05/30/19  HM COLONOSCOPY  Result Value Ref Range   HM Colonoscopy Patient Reported See Report (in chart), Patient Reported   Assessment & Plan:   Problem List Items Addressed This Visit    Obesity, morbid, BMI 40.0-49.9 ([place])    Encouraged healthy diet and lifestyle changes to affect sustainable weight loss.       Relevant Medications   Semaglutide,0.25 or 0.5MG /DOS, (OZEMPIC, 0.25 OR 0.5 MG/DOSE,) 2 MG/1.5ML SOPN   Hypertension, essential    Stable off antihypertensive.       Relevant Medications   atorvastatin (LIPITOR) 40 MG tablet   Health maintenance examination - Primary    Preventative protocols reviewed and updated unless pt declined. Discussed healthy diet and lifestyle.       Family history of prostate cancer    PSA stable. Denies prostate symptoms. Continue yearly PSA check.       Dyslipidemia associated with type 2 diabetes mellitus (HCC)    Chronic with LDL at goal, but trig too high - will increase atorvastatin to 40mg  daily. The 10-year ASCVD risk score Mikey Bussing DC Brooke Bonito., et al., 2013) is: 17.6%   Values used to calculate the score:     Age: 47 years     Sex: Male     Is Non-Hispanic African American: Yes     Diabetic: Yes     Tobacco smoker: No     Systolic Blood Pressure: 195 mmHg     Is BP treated: No     HDL Cholesterol: 30.2 mg/dL     Total Cholesterol: 251 mg/dL       Relevant Medications   Semaglutide,0.25 or 0.5MG /DOS, (OZEMPIC, 0.25 OR 0.5 MG/DOSE,) 2 MG/1.5ML SOPN   atorvastatin  (LIPITOR) 40 MG tablet   Diabetic neuropathy (HCC)    PRN gabapentin.       Relevant Medications   Semaglutide,0.25 or 0.5MG /DOS, (OZEMPIC, 0.25 OR 0.5 MG/DOSE,) 2 MG/1.5ML SOPN   atorvastatin (LIPITOR) 40 MG tablet   Diabetes mellitus type 2, uncontrolled, with complications (HCC)    Remains uncontrolled despite full dose 3 oral antihyperglycemic regimen. He is hesitant for injection but agrees to trial ozempic - sent in. Reviewed monitoring for nausea. Reviewed side effects to watch on jardiance. Pt agrees with plan. RTC 3-4 mo f/u visit. Encouraged schedule eye exam. Agrees to pneumovax.       Relevant Medications   Semaglutide,0.25 or 0.5MG /DOS, (OZEMPIC, 0.25 OR 0.5 MG/DOSE,) 2 MG/1.5ML SOPN   atorvastatin (LIPITOR) 40 MG tablet    Other Visit Diagnoses    Need for 23-polyvalent pneumococcal polysaccharide vaccine       Relevant Orders   Pneumococcal polysaccharide vaccine 23-valent greater than or equal to 2yo subcutaneous/IM       Meds ordered this encounter  Medications  . Semaglutide,0.25 or 0.5MG /DOS, (OZEMPIC, 0.25 OR 0.5 MG/DOSE,) 2 MG/1.5ML SOPN    Sig: Inject 0.25 mg into the skin once a week for 14 days, THEN 0.5 mg once a week.    Dispense:  1 pen    Refill:  6  . atorvastatin (LIPITOR) 40 MG tablet    Sig: Take 1 tablet (40 mg total) by mouth daily at 6 PM.    Dispense:  90 tablet    Refill:  3   Orders Placed This Encounter  Procedures  . Pneumococcal polysaccharide vaccine 23-valent greater than or equal to 2yo subcutaneous/IM  . HM COLONOSCOPY    This external order was  created through the Results Console.    Patient instructions: Pneumovax today Schedule eye exam  Bring me copy of colonoscopy report at your convenience.  Try ozempic 0.25mg  weekly for 2 weeks then increase to 0.5mg  weekly. This was sent to local pharmacy Increase atorvastatin to 40mg  daily - new dose at pharmacy. Return in 3-4 months for diabetes follow up visit.   Follow up  plan: Return in about 4 months (around 09/27/2019) for follow up visit.  Eustaquio BoydenJavier Annet Manukyan, MD

## 2019-05-30 NOTE — Assessment & Plan Note (Signed)
Stable off antihypertensive.

## 2019-05-30 NOTE — Assessment & Plan Note (Signed)
PSA stable. Denies prostate symptoms. Continue yearly PSA check.

## 2019-05-30 NOTE — Assessment & Plan Note (Addendum)
Remains uncontrolled despite full dose 3 oral antihyperglycemic regimen. He is hesitant for injection but agrees to trial ozempic - sent in. Reviewed monitoring for nausea. Reviewed side effects to watch on jardiance. Pt agrees with plan. RTC 3-4 mo f/u visit. Encouraged schedule eye exam. Agrees to pneumovax.

## 2019-05-30 NOTE — Assessment & Plan Note (Signed)
PRN gabapentin.

## 2019-06-02 ENCOUNTER — Other Ambulatory Visit: Payer: Self-pay | Admitting: Family Medicine

## 2019-07-16 ENCOUNTER — Other Ambulatory Visit: Payer: Self-pay | Admitting: Family Medicine

## 2019-08-18 DIAGNOSIS — U071 COVID-19: Secondary | ICD-10-CM

## 2019-08-18 HISTORY — DX: COVID-19: U07.1

## 2019-08-21 ENCOUNTER — Other Ambulatory Visit: Payer: Self-pay

## 2019-08-21 MED ORDER — GLIPIZIDE 10 MG PO TABS: 10.0000 mg | ORAL_TABLET | Freq: Two times a day (BID) | ORAL | 3 refills | Status: DC

## 2019-08-21 MED ORDER — METFORMIN HCL 500 MG PO TABS: 500.0000 mg | ORAL_TABLET | Freq: Two times a day (BID) | ORAL | 3 refills | Status: DC

## 2019-08-21 NOTE — Telephone Encounter (Signed)
Receiving refill request from [facility] for patient's medications. Spoke with patient and verified that he is using this pharmacy now. Pharmacy added in his chart. Refills provided.

## 2019-08-22 ENCOUNTER — Emergency Department (HOSPITAL_COMMUNITY)
Admission: EM | Admit: 2019-08-22 | Discharge: 2019-08-23 | Disposition: A | Discharge status: Home / Self Care | Payer: No Typology Code available for payment source | Source: Home / Self Care | Attending: Emergency Medicine | Admitting: Emergency Medicine

## 2019-08-22 ENCOUNTER — Other Ambulatory Visit: Payer: Self-pay

## 2019-08-22 ENCOUNTER — Encounter (HOSPITAL_COMMUNITY): Payer: Self-pay

## 2019-08-22 ENCOUNTER — Emergency Department (HOSPITAL_COMMUNITY): Payer: No Typology Code available for payment source

## 2019-08-22 DIAGNOSIS — R0602 Shortness of breath: Secondary | ICD-10-CM | POA: Diagnosis not present

## 2019-08-22 DIAGNOSIS — I10 Essential (primary) hypertension: Secondary | ICD-10-CM | POA: Insufficient documentation

## 2019-08-22 DIAGNOSIS — U071 COVID-19: Secondary | ICD-10-CM | POA: Diagnosis not present

## 2019-08-22 DIAGNOSIS — M791 Myalgia, unspecified site: Secondary | ICD-10-CM | POA: Insufficient documentation

## 2019-08-22 DIAGNOSIS — R1012 Left upper quadrant pain: Secondary | ICD-10-CM | POA: Insufficient documentation

## 2019-08-22 DIAGNOSIS — R5383 Other fatigue: Secondary | ICD-10-CM | POA: Insufficient documentation

## 2019-08-22 DIAGNOSIS — Z79899 Other long term (current) drug therapy: Secondary | ICD-10-CM | POA: Insufficient documentation

## 2019-08-22 DIAGNOSIS — Z7984 Long term (current) use of oral hypoglycemic drugs: Secondary | ICD-10-CM | POA: Insufficient documentation

## 2019-08-22 DIAGNOSIS — R509 Fever, unspecified: Secondary | ICD-10-CM | POA: Insufficient documentation

## 2019-08-22 DIAGNOSIS — E119 Type 2 diabetes mellitus without complications: Secondary | ICD-10-CM | POA: Insufficient documentation

## 2019-08-22 LAB — COMPREHENSIVE METABOLIC PANEL
ALT: 31 U/L (ref 0–44)
AST: 20 U/L (ref 15–41)
Albumin: 3.8 g/dL (ref 3.5–5.0)
Alkaline Phosphatase: 61 U/L (ref 38–126)
Anion gap: 9 (ref 5–15)
BUN: 17 mg/dL (ref 6–20)
CO2: 22 mmol/L (ref 22–32)
Calcium: 8.9 mg/dL (ref 8.9–10.3)
Chloride: 109 mmol/L (ref 98–111)
Creatinine, Ser: 1.24 mg/dL (ref 0.61–1.24)
GFR calc Af Amer: >60 mL/min (ref >60)
GFR calc non Af Amer: >60 mL/min (ref >60)
Glucose, Bld: 167 mg/dL — ABNORMAL HIGH (ref 70–99)
Potassium: 4.3 mmol/L (ref 3.5–5.1)
Sodium: 140 mmol/L (ref 135–145)
Total Bilirubin: 0.6 mg/dL (ref 0.3–1.2)
Total Protein: 7.4 g/dL (ref 6.5–8.1)

## 2019-08-22 LAB — CBC WITH DIFFERENTIAL/PLATELET
Abs Immature Granulocytes: 0.02 10*3/uL (ref 0.00–0.07)
Basophils Absolute: 0 10*3/uL (ref 0.0–0.1)
Basophils Relative: 0 %
Eosinophils Absolute: 0 10*3/uL (ref 0.0–0.5)
Eosinophils Relative: 0 %
HCT: 47.2 % (ref 39.0–52.0)
Hemoglobin: 15.6 g/dL (ref 13.0–17.0)
Immature Granulocytes: 0 %
Lymphocytes Relative: 26 %
Lymphs Abs: 1.4 10*3/uL (ref 0.7–4.0)
MCH: 30.6 pg (ref 26.0–34.0)
MCHC: 33.1 g/dL (ref 30.0–36.0)
MCV: 92.5 fL (ref 80.0–100.0)
Monocytes Absolute: 0.3 10*3/uL (ref 0.1–1.0)
Monocytes Relative: 6 %
Neutro Abs: 3.5 10*3/uL (ref 1.7–7.7)
Neutrophils Relative %: 68 %
Platelets: 175 10*3/uL (ref 150–400)
RBC: 5.1 MIL/uL (ref 4.22–5.81)
RDW: 14.2 % (ref 11.5–15.5)
WBC: 5.2 10*3/uL (ref 4.0–10.5)
nRBC: 0 % (ref 0.0–0.2)

## 2019-08-22 LAB — LIPASE, BLOOD: Lipase: 39 U/L (ref 11–51)

## 2019-08-22 MED ORDER — LIDOCAINE VISCOUS HCL 2 % MT SOLN
15.0000 mL | Freq: Once | OROMUCOSAL | Status: AC
  Administered 2019-08-22: 23:00:00 15 mL via ORAL
  Filled 2019-08-22: qty 15

## 2019-08-22 MED ORDER — ALUM & MAG HYDROXIDE-SIMETH 200-200-20 MG/5ML PO SUSP
30.0000 mL | Freq: Once | ORAL | Status: AC
  Administered 2019-08-22: 30 mL via ORAL
  Filled 2019-08-22: qty 30

## 2019-08-22 MED ORDER — KETOROLAC TROMETHAMINE 30 MG/ML IJ SOLN
15.0000 mg | Freq: Once | INTRAMUSCULAR | Status: AC
  Administered 2019-08-22: 15 mg via INTRAVENOUS
  Filled 2019-08-22: qty 1

## 2019-08-22 MED ORDER — PANTOPRAZOLE SODIUM 40 MG IV SOLR
40.0000 mg | Freq: Once | INTRAVENOUS | Status: AC
  Administered 2019-08-22: 40 mg via INTRAVENOUS
  Filled 2019-08-22: qty 40

## 2019-08-22 NOTE — ED Triage Notes (Signed)
Pt reports LUQ pain that started around 530pm. Pt denies N/V/D.

## 2019-08-22 NOTE — ED Provider Notes (Signed)
[facility]-EMERGENCY DEPT Provider Note   CSN: [id number] Arrival date & time: 08/22/19  2148     History Chief Complaint  Patient presents with  . Abdominal Pain    Jonathon [name] is a 54 y.o. male.  54 year old male with hx of DM, HLD, HTN, obesity presents to the emergency department for evaluation of left upper quadrant pain.  He describes a sharp, cramping pain which lasts about 40 seconds before resolving for 5 to 10 minutes; subsequently recurring.  Pain has been ongoing since 1730 tonight.  He had had a cheeseburger 30 minutes prior to onset of his pain, but denies specific aggravation of his pain with eating.  Pain does not radiate and is not aggravated with exertion.  Has not taken any medications for his symptoms.  No nausea, vomiting, diarrhea, dysuria, hematuria, SOB.  Denies hx of abdominal surgeries.    Patient does report preceding symptoms of fever (Tmax 100.2F), myalgias, chills, fatigue x 4 days.  These symptoms have not worsened since his pain began tonight.  Had a COVID test on Monday which was negative.  The history is provided by the patient. No language interpreter was used.  Abdominal Pain      Past Medical History:  Diagnosis Date  . ED (erectile dysfunction) of organic origin   . Elevated PSA 2012   with L lobe induration on DRE, normal biopsy 2012 ([place])  . Family history of prostate cancer    father, brother  . Genital herpes   . History of chronic prostatitis 2012   granulomatous, CXR done - negative  . History of hypertension   . HLD (hyperlipidemia)   . Morbid obesity (HCC)   . Seasonal allergies   . T2DM (type 2 diabetes mellitus) (HCC) 2010   lantus started by [facility]    Patient Active Problem List   Diagnosis Date Noted  . Health maintenance examination 05/30/2019  . Pain of right thumb 12/02/2018  . Right knee pain 12/07/2016  . Right sided sciatica 11/15/2016  . 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 Tinea pedis  08/17/2016  . Knee pain, left 05/10/2014  . Exposure to toxic chemical 03/04/2014  . Diabetic neuropathy (HCC) 03/04/2014  . Genital herpes   . Diabetes mellitus type 2, uncontrolled, with complications (HCC)   . Hypertension, essential   . Dyslipidemia associated with type 2 diabetes mellitus (HCC)   . Obesity, morbid, BMI 40.0-49.9 (HCC)   . ED (erectile dysfunction) of organic origin   . Family history of prostate cancer     Past Surgical History:  Procedure Laterality Date  . COLONOSCOPY  02/14/2018   at [facility] per patient - told normal  . ETT  2012   WNL ([facility]a [facility])  . I & D EXTREMITY Right 12/03/2012   Procedure: IRRIGATION AND DEBRIDEMENT RIGHT HAND ABSCESS ;  Surgeon: Tami Ribas, MD;  Location: [facility];  Service: Orthopedics;  Laterality: Right;  . PROSTATE BIOPSY  2012   granulomatous prostatitis       Family History  Problem Relation Age of Onset  . Cancer Father 48       colon, prostate  . Hypertension Father   . Diabetes Father   . Stroke Father 58  . Coronary artery disease Father 45       MI  . Cancer Brother 33       prostate  . Diabetes Mother   . Diabetes Sister     Social History  Tobacco Use  . Smoking status: Never Smoker  . Smokeless tobacco: Never Used  Substance Use Topics  . Alcohol use: Yes    Comment: occasional  . Drug use: No    Home Medications Prior to Admission medications   Medication Sig Start Date End Date Taking? Authorizing Provider  glipiZIDE (GLUCOTROL) 10 MG tablet Take 1 tablet (10 mg total) by mouth 2 (two) times daily before lunch and supper. 08/21/19  Yes [name], MD  JARDIANCE 25 MG TABS tablet TAKE 1 TABLET BY MOUTH EVERY DAY Patient taking differently: Take 25 mg by mouth every morning.  07/16/19  Yes [name], MD  metFORMIN (GLUCOPHAGE) 500 MG tablet Take 1 tablet (500 mg total) by mouth 2 (two) times daily with a meal. 08/21/19  Yes [name], MD  naproxen (NAPROSYN)  500 MG tablet TAKE ONE TABLET BY MOUTH TWICE DAILY X1 WEEK THEN AS NEEDED FOR PAIN Patient taking differently: Take 500 mg by mouth 2 (two) times daily as needed for moderate pain.  05/09/19  Yes [name], MD  Semaglutide (OZEMPIC, 0.25 OR 0.5 MG/DOSE, [place]) Inject 0.5 mg into the skin every Sunday.   Yes [provider]  atorvastatin (LIPITOR) 40 MG tablet Take 1 tablet (40 mg total) by mouth daily at 6 PM. Patient not taking: Reported on 08/22/2019 05/30/19   [name], MD  clotrimazole (LOTRIMIN) 1 % cream APPLY TO AFFECTED AREA TWICE A DAY Patient not taking: Reported on 08/22/2019 04/01/19   [name], MD  glucose blood (ONE TOUCH ULTRA TEST) test strip Check blood sugar twice a day and as instructed. Dx 250.00 03/20/19   [name], MD  meloxicam (MOBIC) 15 MG tablet Take 1 tablet (15 mg total) by mouth daily. Patient not taking: Reported on 08/22/2019 12/07/16   [name], MD  pantoprazole (PROTONIX) 20 MG tablet Take 1 tablet (20 mg total) by mouth daily. 08/23/19   Antony Madura, PA-C  sildenafil (VIAGRA) 100 MG tablet TAKE 0.5-1 TABLETS (50-100 MG TOTAL) BY MOUTH DAILY AS NEEDED FOR ERECTILE DYSFUNCTION. 07/16/19   [name], MD    Allergies    Flexeril [cyclobenzaprine]  Review of Systems   Review of Systems  Gastrointestinal: Positive for abdominal pain.  Ten systems reviewed and are negative for acute change, except as noted in the HPI.    Physical Exam Updated Vital Signs BP (!) 160/96   Pulse 95   Temp 99.5 F (37.5 C) (Oral)   Resp (!) 21   SpO2 95%   Physical Exam Vitals and nursing note reviewed.  Constitutional:      General: He is not in acute distress.    Appearance: He is well-developed. He is not diaphoretic.     Comments: Intermittently appears uncomfortable. In NAD.  HENT:     Head: Normocephalic and atraumatic.  Eyes:     General: No scleral icterus.    Conjunctiva/sclera: Conjunctivae normal.   Cardiovascular:     Rate and Rhythm: Normal rate and regular rhythm.     Pulses: Normal pulses.  Pulmonary:     Effort: Pulmonary effort is normal. No respiratory distress.     Comments: Respirations even and unlabored Abdominal:     Palpations: Abdomen is soft.     Comments: Soft, obese and distended abdomen. No reproducible TTP on exam. No guarding.  Musculoskeletal:        General: Normal range of motion.     Cervical back: Normal range of motion.  Skin:  General: Skin is warm and dry.     Coloration: Skin is not pale.     Findings: No erythema or rash.  Neurological:     Mental Status: He is alert and oriented to person, place, and time.     Coordination: Coordination normal.  Psychiatric:        Behavior: Behavior normal.     ED Results / Procedures / Treatments   Labs (all labs ordered are listed, but only abnormal results are displayed) Labs Reviewed  COMPREHENSIVE METABOLIC PANEL - Abnormal; Notable for the following components:      Result Value   Glucose, Bld 167 (*)    All other components within normal limits  CBC WITH DIFFERENTIAL/PLATELET  LIPASE, BLOOD  TROPONIN I (HIGH SENSITIVITY)    EKG ED ECG REPORT   Date: 08/23/2019  Rate: 97  Rhythm: normal sinus rhythm  QRS Axis: normal  Intervals: normal  ST/T Wave abnormalities: nonspecific T wave changes  Conduction Disutrbances:none  Narrative Interpretation: Sinus rhythm, probably LAE, borderline T wave abnormalities.  Old EKG Reviewed: none available  I have personally reviewed the EKG tracing and agree with the computerized printout as noted.   Radiology DG Chest 2 View  Result Date: 08/22/2019 CLINICAL DATA:  Left upper quadrant pain EXAM: CHEST - 2 VIEW COMPARISON:  August 08, 2016 FINDINGS: The heart size and mediastinal contours are within normal limits. Both lungs are clear. The visualized skeletal structures are unremarkable. IMPRESSION: No active cardiopulmonary disease. Electronically  Signed   By: Jonna Clark M.D.   On: 08/22/2019 22:46    Procedures Procedures (including critical care time)  Medications Ordered in ED Medications  alum & mag hydroxide-simeth (MAALOX/MYLANTA) 200-200-20 MG/5ML suspension 30 mL (30 mLs Oral Given 08/22/19 2241)    And  lidocaine (XYLOCAINE) 2 % viscous mouth solution 15 mL (15 mLs Oral Given 08/22/19 2241)  pantoprazole (PROTONIX) injection 40 mg (40 mg Intravenous Given 08/22/19 2242)  ketorolac (TORADOL) 30 MG/ML injection 15 mg (15 mg Intravenous Given 08/22/19 2242)    ED Course  I have reviewed the triage vital signs and the nursing notes.  Pertinent labs & imaging results that were available during my care of the patient were reviewed by me and considered in my medical decision making (see chart for details).  Clinical Course as of Aug 22 541  Sat Aug 23, 2019  0071 Patient reassessed. Appears more comfortable. Heart rate in the 90's. No hypoxia. He states that he feels better. Pain continues to wax and wane but currently rated at a 3/10. Feels the medications helped some.  Repeat abdominal exam with some focal LUQ TTP. No peritoneal signs. Labs and imaging reviewed with the patient which are WNL. No cardiopulmonary abnormality on CXR. No free air under the diaphragm. EKG is nonischemic and troponin negative. Symptoms atypical for ACS; low suspicion for emergent cardiopulmonary abnormality given reassuring evaluation today.  Will PO challenge. Water given. Questions gastritis vs MSK spasm. May be worth a trial on PPIs until able to see his PCP.   [KH]    Clinical Course User Index [KH] Darylene Price   MDM Rules/Calculators/A&P                      54 year old male presents to the emergency department for left upper quadrant abdominal pain.  Initially not able to reproduce pain on palpation, though had some mild tenderness in the left upper quadrant on repeat exam.  He has  no peritoneal signs, palpable masses, guarding.   Symptoms have improved in the ED following GI cocktail, Protonix, Toradol.  Now tolerating oral fluids without difficulty.  PO intake does not exacerbate symptoms.  Work-up in the emergency department today has been reassuring.  Given clinical improvement, do not feel further emergent imaging is indicated.  He has no evidence of free air under his diaphragm to suggest acute perforation.  EKG and troponin not concerning for acute ischemia or ACS.  Question gastritis versus muscle spasm.  Will trial on a course of Protonix until patient is able to follow-up with his primary care doctor.  Return precautions discussed and provided. Patient discharged in stable condition with no unaddressed concerns.   Final Clinical Impression(s) / ED Diagnoses Final diagnoses:  Left upper quadrant abdominal pain    Rx / DC Orders ED Discharge Orders         Ordered    pantoprazole (PROTONIX) 20 MG tablet  Daily     08/23/19 0134           Antonietta Breach, PA-C 08/23/19 0547    Virgel Manifold, MD 08/24/19 1256

## 2019-08-23 LAB — TROPONIN I (HIGH SENSITIVITY): Troponin I (High Sensitivity): 9 ng/L (ref <18)

## 2019-08-23 MED ORDER — PANTOPRAZOLE SODIUM 20 MG PO TBEC: 20.0000 mg | DELAYED_RELEASE_TABLET | Freq: Every day | ORAL | 1 refills | Status: DC

## 2019-08-23 NOTE — ED Notes (Signed)
Pt drank a glass of water and has reported no nausea and no increase in abdominal pain.

## 2019-08-23 NOTE — Discharge Instructions (Signed)
Your evaluation in the emergency department today was reassuring.  We recommend Protonix daily to try and help alleviate symptom recurrence.  You may take Tylenol or ibuprofen for any recurrent discomfort.  We recommend follow-up with your primary care doctor.  Return to the ED if symptoms persist or worsen.

## 2019-08-25 ENCOUNTER — Encounter (HOSPITAL_COMMUNITY): Payer: Self-pay

## 2019-08-25 ENCOUNTER — Telehealth: Payer: Self-pay

## 2019-08-25 ENCOUNTER — Other Ambulatory Visit: Payer: Self-pay

## 2019-08-25 ENCOUNTER — Inpatient Hospital Stay (HOSPITAL_COMMUNITY)
Admission: EM | Admit: 2019-08-25 | Discharge: 2019-09-02 | DRG: 177 | Disposition: A | Discharge status: Home / Self Care | Payer: No Typology Code available for payment source | Attending: Family Medicine | Admitting: Family Medicine

## 2019-08-25 ENCOUNTER — Emergency Department (HOSPITAL_COMMUNITY): Payer: No Typology Code available for payment source

## 2019-08-25 DIAGNOSIS — I82403 Acute embolism and thrombosis of unspecified deep veins of lower extremity, bilateral: Secondary | ICD-10-CM | POA: Diagnosis not present

## 2019-08-25 DIAGNOSIS — Z79899 Other long term (current) drug therapy: Secondary | ICD-10-CM | POA: Diagnosis not present

## 2019-08-25 DIAGNOSIS — J1282 Pneumonia due to coronavirus disease 2019: Secondary | ICD-10-CM | POA: Diagnosis not present

## 2019-08-25 DIAGNOSIS — Z6839 Body mass index (BMI) 39.0-39.9, adult: Secondary | ICD-10-CM

## 2019-08-25 DIAGNOSIS — Z800 Family history of malignant neoplasm of digestive organs: Secondary | ICD-10-CM

## 2019-08-25 DIAGNOSIS — R0602 Shortness of breath: Secondary | ICD-10-CM | POA: Diagnosis present

## 2019-08-25 DIAGNOSIS — I10 Essential (primary) hypertension: Secondary | ICD-10-CM | POA: Diagnosis present

## 2019-08-25 DIAGNOSIS — N529 Male erectile dysfunction, unspecified: Secondary | ICD-10-CM | POA: Diagnosis present

## 2019-08-25 DIAGNOSIS — Z833 Family history of diabetes mellitus: Secondary | ICD-10-CM | POA: Diagnosis not present

## 2019-08-25 DIAGNOSIS — Z823 Family history of stroke: Secondary | ICD-10-CM

## 2019-08-25 DIAGNOSIS — K219 Gastro-esophageal reflux disease without esophagitis: Secondary | ICD-10-CM | POA: Diagnosis present

## 2019-08-25 DIAGNOSIS — N411 Chronic prostatitis: Secondary | ICD-10-CM | POA: Diagnosis present

## 2019-08-25 DIAGNOSIS — E785 Hyperlipidemia, unspecified: Secondary | ICD-10-CM | POA: Diagnosis present

## 2019-08-25 DIAGNOSIS — E1165 Type 2 diabetes mellitus with hyperglycemia: Secondary | ICD-10-CM | POA: Diagnosis not present

## 2019-08-25 DIAGNOSIS — Z8249 Family history of ischemic heart disease and other diseases of the circulatory system: Secondary | ICD-10-CM

## 2019-08-25 DIAGNOSIS — J9601 Acute respiratory failure with hypoxia: Secondary | ICD-10-CM | POA: Diagnosis present

## 2019-08-25 DIAGNOSIS — T380X5A Adverse effect of glucocorticoids and synthetic analogues, initial encounter: Secondary | ICD-10-CM | POA: Diagnosis not present

## 2019-08-25 DIAGNOSIS — B372 Candidiasis of skin and nail: Secondary | ICD-10-CM | POA: Diagnosis present

## 2019-08-25 DIAGNOSIS — Z791 Long term (current) use of non-steroidal anti-inflammatories (NSAID): Secondary | ICD-10-CM

## 2019-08-25 DIAGNOSIS — E1169 Type 2 diabetes mellitus with other specified complication: Secondary | ICD-10-CM | POA: Diagnosis present

## 2019-08-25 DIAGNOSIS — U071 COVID-19: Secondary | ICD-10-CM | POA: Diagnosis not present

## 2019-08-25 DIAGNOSIS — I82409 Acute embolism and thrombosis of unspecified deep veins of unspecified lower extremity: Secondary | ICD-10-CM | POA: Diagnosis not present

## 2019-08-25 DIAGNOSIS — Z8042 Family history of malignant neoplasm of prostate: Secondary | ICD-10-CM | POA: Diagnosis not present

## 2019-08-25 DIAGNOSIS — Z7984 Long term (current) use of oral hypoglycemic drugs: Secondary | ICD-10-CM

## 2019-08-25 DIAGNOSIS — Z888 Allergy status to other drugs, medicaments and biological substances status: Secondary | ICD-10-CM

## 2019-08-25 DIAGNOSIS — E1140 Type 2 diabetes mellitus with diabetic neuropathy, unspecified: Secondary | ICD-10-CM | POA: Diagnosis present

## 2019-08-25 DIAGNOSIS — IMO0002 Reserved for concepts with insufficient information to code with codable children: Secondary | ICD-10-CM | POA: Diagnosis present

## 2019-08-25 DIAGNOSIS — J9600 Acute respiratory failure, unspecified whether with hypoxia or hypercapnia: Secondary | ICD-10-CM

## 2019-08-25 DIAGNOSIS — J189 Pneumonia, unspecified organism: Secondary | ICD-10-CM

## 2019-08-25 DIAGNOSIS — E118 Type 2 diabetes mellitus with unspecified complications: Secondary | ICD-10-CM | POA: Diagnosis present

## 2019-08-25 LAB — CBC WITH DIFFERENTIAL/PLATELET
Abs Immature Granulocytes: 0.01 10*3/uL (ref 0.00–0.07)
Basophils Absolute: 0 10*3/uL (ref 0.0–0.1)
Basophils Relative: 0 %
Eosinophils Absolute: 0 10*3/uL (ref 0.0–0.5)
Eosinophils Relative: 0 %
HCT: 48 % (ref 39.0–52.0)
Hemoglobin: 15.7 g/dL (ref 13.0–17.0)
Immature Granulocytes: 0 %
Lymphocytes Relative: 24 %
Lymphs Abs: 1.1 10*3/uL (ref 0.7–4.0)
MCH: 30.6 pg (ref 26.0–34.0)
MCHC: 32.7 g/dL (ref 30.0–36.0)
MCV: 93.6 fL (ref 80.0–100.0)
Monocytes Absolute: 0.2 10*3/uL (ref 0.1–1.0)
Monocytes Relative: 4 %
Neutro Abs: 3.3 10*3/uL (ref 1.7–7.7)
Neutrophils Relative %: 72 %
Platelets: 175 10*3/uL (ref 150–400)
RBC: 5.13 MIL/uL (ref 4.22–5.81)
RDW: 14.4 % (ref 11.5–15.5)
WBC: 4.5 10*3/uL (ref 4.0–10.5)
nRBC: 0 % (ref 0.0–0.2)

## 2019-08-25 LAB — COMPREHENSIVE METABOLIC PANEL
ALT: 30 U/L (ref 0–44)
AST: 29 U/L (ref 15–41)
Albumin: 3.5 g/dL (ref 3.5–5.0)
Alkaline Phosphatase: 59 U/L (ref 38–126)
Anion gap: 12 (ref 5–15)
BUN: 15 mg/dL (ref 6–20)
CO2: 23 mmol/L (ref 22–32)
Calcium: 8.6 mg/dL — ABNORMAL LOW (ref 8.9–10.3)
Chloride: 105 mmol/L (ref 98–111)
Creatinine, Ser: 1.36 mg/dL — ABNORMAL HIGH (ref 0.61–1.24)
GFR calc Af Amer: >60 mL/min (ref >60)
GFR calc non Af Amer: 58 mL/min — ABNORMAL LOW (ref >60)
Glucose, Bld: 246 mg/dL — ABNORMAL HIGH (ref 70–99)
Potassium: 4.3 mmol/L (ref 3.5–5.1)
Sodium: 140 mmol/L (ref 135–145)
Total Bilirubin: 0.8 mg/dL (ref 0.3–1.2)
Total Protein: 7.8 g/dL (ref 6.5–8.1)

## 2019-08-25 LAB — PROCALCITONIN: Procalcitonin: 0.57 ng/mL

## 2019-08-25 LAB — D-DIMER, QUANTITATIVE: D-Dimer, Quant: 0.45 ug/mL-FEU (ref 0.00–0.50)

## 2019-08-25 LAB — POC SARS CORONAVIRUS 2 AG -  ED: SARS Coronavirus 2 Ag: POSITIVE — AB

## 2019-08-25 LAB — C-REACTIVE PROTEIN: CRP: 26.6 mg/dL — ABNORMAL HIGH (ref <1.0)

## 2019-08-25 LAB — LACTIC ACID, PLASMA
Lactic Acid, Venous: 2.5 mmol/L (ref 0.5–1.9)
Lactic Acid, Venous: 1.4 mmol/L (ref 0.5–1.9)

## 2019-08-25 LAB — FERRITIN: Ferritin: 594 ng/mL — ABNORMAL HIGH (ref 24–336)

## 2019-08-25 LAB — TRIGLYCERIDES: Triglycerides: 249 mg/dL — ABNORMAL HIGH (ref <150)

## 2019-08-25 LAB — LACTATE DEHYDROGENASE: LDH: 303 U/L — ABNORMAL HIGH (ref 98–192)

## 2019-08-25 LAB — FIBRINOGEN: Fibrinogen: 797 mg/dL — ABNORMAL HIGH (ref 210–475)

## 2019-08-25 LAB — GLUCOSE, CAPILLARY: Glucose-Capillary: 212 mg/dL — ABNORMAL HIGH (ref 70–99)

## 2019-08-25 MED ORDER — DEXAMETHASONE SODIUM PHOSPHATE 10 MG/ML IJ SOLN
10.0000 mg | Freq: Once | INTRAMUSCULAR | Status: AC
  Administered 2019-08-25: 10 mg via INTRAVENOUS
  Filled 2019-08-25: qty 1

## 2019-08-25 MED ORDER — SODIUM CHLORIDE 0.9 % IV SOLN
200.0000 mg | Freq: Once | INTRAVENOUS | Status: AC
  Administered 2019-08-25: 200 mg via INTRAVENOUS
  Filled 2019-08-25: qty 200

## 2019-08-25 MED ORDER — ONDANSETRON HCL 4 MG PO TABS: 4.0000 mg | ORAL_TABLET | Freq: Four times a day (QID) | ORAL | Status: DC | PRN

## 2019-08-25 MED ORDER — ACETAMINOPHEN 325 MG PO TABS
650.0000 mg | ORAL_TABLET | Freq: Once | ORAL | Status: AC
  Administered 2019-08-25: 650 mg via ORAL
  Filled 2019-08-25: qty 2

## 2019-08-25 MED ORDER — ASCORBIC ACID 500 MG PO TABS
500.0000 mg | ORAL_TABLET | Freq: Every day | ORAL | Status: DC
  Administered 2019-08-26 – 2019-09-02 (×8): 500 mg via ORAL
  Filled 2019-08-25 (×8): qty 1

## 2019-08-25 MED ORDER — INSULIN ASPART 100 UNIT/ML ~~LOC~~ SOLN
0.0000 [IU] | Freq: Three times a day (TID) | SUBCUTANEOUS | Status: DC
  Administered 2019-08-26: 08:00:00 5 [IU] via SUBCUTANEOUS
  Filled 2019-08-25: qty 0.15

## 2019-08-25 MED ORDER — SODIUM CHLORIDE 0.9 % IV SOLN
500.0000 mg | INTRAVENOUS | Status: DC
  Administered 2019-08-26 – 2019-08-27 (×2): 500 mg via INTRAVENOUS
  Filled 2019-08-25 (×3): qty 500

## 2019-08-25 MED ORDER — SODIUM CHLORIDE 0.9 % IV SOLN
100.0000 mg | Freq: Every day | INTRAVENOUS | Status: AC
  Administered 2019-08-26 – 2019-08-29 (×4): 100 mg via INTRAVENOUS
  Filled 2019-08-25 (×4): qty 20

## 2019-08-25 MED ORDER — ENOXAPARIN SODIUM 40 MG/0.4ML ~~LOC~~ SOLN
40.0000 mg | SUBCUTANEOUS | Status: DC
  Administered 2019-08-26 (×2): 40 mg via SUBCUTANEOUS
  Filled 2019-08-25 (×2): qty 0.4

## 2019-08-25 MED ORDER — SODIUM CHLORIDE 0.9 % IV SOLN
500.0000 mg | Freq: Once | INTRAVENOUS | Status: AC
  Administered 2019-08-25: 500 mg via INTRAVENOUS
  Filled 2019-08-25: qty 500

## 2019-08-25 MED ORDER — HYDROCOD POLST-CPM POLST ER 10-8 MG/5ML PO SUER
5.0000 mL | Freq: Two times a day (BID) | ORAL | Status: DC | PRN
  Administered 2019-08-26 – 2019-09-01 (×8): 5 mL via ORAL
  Filled 2019-08-25 (×8): qty 5

## 2019-08-25 MED ORDER — PANTOPRAZOLE SODIUM 20 MG PO TBEC
20.0000 mg | DELAYED_RELEASE_TABLET | Freq: Every day | ORAL | Status: DC
  Administered 2019-08-26 – 2019-09-02 (×8): 20 mg via ORAL
  Filled 2019-08-25 (×9): qty 1

## 2019-08-25 MED ORDER — ONDANSETRON HCL 4 MG/2ML IJ SOLN: 4.0000 mg | Freq: Four times a day (QID) | INTRAMUSCULAR | Status: DC | PRN

## 2019-08-25 MED ORDER — DEXAMETHASONE SODIUM PHOSPHATE 10 MG/ML IJ SOLN: 6.0000 mg | INTRAMUSCULAR | Status: DC

## 2019-08-25 MED ORDER — ZINC SULFATE 220 (50 ZN) MG PO CAPS
220.0000 mg | ORAL_CAPSULE | Freq: Every day | ORAL | Status: DC
  Administered 2019-08-26 – 2019-09-02 (×8): 220 mg via ORAL
  Filled 2019-08-25 (×8): qty 1

## 2019-08-25 MED ORDER — GUAIFENESIN-DM 100-10 MG/5ML PO SYRP
10.0000 mL | ORAL_SOLUTION | ORAL | Status: DC | PRN
  Administered 2019-08-26 – 2019-09-01 (×11): 10 mL via ORAL
  Filled 2019-08-25 (×12): qty 10

## 2019-08-25 MED ORDER — ALBUTEROL SULFATE HFA 108 (90 BASE) MCG/ACT IN AERS
2.0000 | INHALATION_SPRAY | Freq: Four times a day (QID) | RESPIRATORY_TRACT | Status: DC
  Administered 2019-08-26 – 2019-09-02 (×30): 2 via RESPIRATORY_TRACT
  Filled 2019-08-25: qty 6.7

## 2019-08-25 MED ORDER — INSULIN GLARGINE 100 UNIT/ML ~~LOC~~ SOLN
15.0000 [IU] | Freq: Every day | SUBCUTANEOUS | Status: DC
  Filled 2019-08-25 (×2): qty 0.15

## 2019-08-25 MED ORDER — SODIUM CHLORIDE 0.9 % IV SOLN
1.0000 g | Freq: Once | INTRAVENOUS | Status: AC
  Administered 2019-08-25: 1 g via INTRAVENOUS
  Filled 2019-08-25: qty 10

## 2019-08-25 MED ORDER — INSULIN ASPART 100 UNIT/ML ~~LOC~~ SOLN
0.0000 [IU] | Freq: Every day | SUBCUTANEOUS | Status: DC
  Filled 2019-08-25: qty 0.05

## 2019-08-25 NOTE — Telephone Encounter (Signed)
Pt was seen in the ER on 08/22/19

## 2019-08-25 NOTE — ED Provider Notes (Signed)
Care assumed from [facility], PA-C at shift change with labs pending.   In brief, this patient is 54 y.o. M with PMH/o DM, HTN who presents for evaluation of 2 to 3 days of worsening shortness of breath, fever, cough.  About 4 to 5 days ago, he had some vomiting, diarrhea that had been going on.  He was seen in the ED and was cleared.  He states that has since resolved but he has continued to have shortness of breath.  Patient reported fevers at home.  He does not have any history of asthma or COPD.  Please see note from previous provider for full history/physical exam.    Physical Exam  BP (!) 142/91   Pulse (!) 103   Temp 99.7 F (37.6 C) (Oral)   Resp (!) 21   Ht 6' 1"  (1.854 m)   Wt 135.2 kg   SpO2 93%   BMI 39.32 kg/m   Physical Exam  ED Course/Procedures     Procedures  Results for orders placed or performed during the hospital encounter of 08/25/19 (from the past 24 hour(s))  Lactic acid, plasma     Status: Abnormal   Collection Time: 08/25/19  3:19 PM  Result Value Ref Range   Lactic Acid, Venous 2.5 (HH) 0.5 - 1.9 mmol/L  CBC WITH DIFFERENTIAL     Status: None   Collection Time: 08/25/19  3:19 PM  Result Value Ref Range   WBC 4.5 4.0 - 10.5 K/uL   RBC 5.13 4.22 - 5.81 MIL/uL   Hemoglobin 15.7 13.0 - 17.0 g/dL   HCT 48.0 39.0 - 52.0 %   MCV 93.6 80.0 - 100.0 fL   MCH 30.6 26.0 - 34.0 pg   MCHC 32.7 30.0 - 36.0 g/dL   RDW 14.4 11.5 - 15.5 %   Platelets 175 150 - 400 K/uL   nRBC 0.0 0.0 - 0.2 %   Neutrophils Relative % 72 %   Neutro Abs 3.3 1.7 - 7.7 K/uL   Lymphocytes Relative 24 %   Lymphs Abs 1.1 0.7 - 4.0 K/uL   Monocytes Relative 4 %   Monocytes Absolute 0.2 0.1 - 1.0 K/uL   Eosinophils Relative 0 %   Eosinophils Absolute 0.0 0.0 - 0.5 K/uL   Basophils Relative 0 %   Basophils Absolute 0.0 0.0 - 0.1 K/uL   Immature Granulocytes 0 %   Abs Immature Granulocytes 0.01 0.00 - 0.07 K/uL  Comprehensive metabolic panel     Status: Abnormal   Collection Time: 08/25/19  3:19 PM  Result Value Ref Range   Sodium 140 135 - 145 mmol/L   Potassium 4.3 3.5 - 5.1 mmol/L   Chloride 105 98 - 111 mmol/L   CO2 23 22 - 32 mmol/L   Glucose, Bld 246 (H) 70 - 99 mg/dL   BUN 15 6 - 20 mg/dL   Creatinine, Ser 1.36 (H) 0.61 - 1.24 mg/dL   Calcium 8.6 (L) 8.9 - 10.3 mg/dL   Total Protein 7.8 6.5 - 8.1 g/dL   Albumin 3.5 3.5 - 5.0 g/dL   AST 29 15 - 41 U/L   ALT 30 0 - 44 U/L   Alkaline Phosphatase 59 38 - 126 U/L   Total Bilirubin 0.8 0.3 - 1.2 mg/dL   GFR calc non Af Amer 58 (L) >60 mL/min   GFR calc Af Amer >60 >60 mL/min   Anion gap 12 5 - 15  D-dimer, quantitative     Status: None  Collection Time: 08/25/19  3:19 PM  Result Value Ref Range   D-Dimer, Quant 0.45 0.00 - 0.50 ug/mL-FEU  Procalcitonin     Status: None   Collection Time: 08/25/19  3:19 PM  Result Value Ref Range   Procalcitonin 0.57 ng/mL  Lactate dehydrogenase     Status: Abnormal   Collection Time: 08/25/19  3:19 PM  Result Value Ref Range   LDH 303 (H) 98 - 192 U/L  Ferritin     Status: Abnormal   Collection Time: 08/25/19  3:19 PM  Result Value Ref Range   Ferritin 594 (H) 24 - 336 ng/mL  Triglycerides     Status: Abnormal   Collection Time: 08/25/19  3:19 PM  Result Value Ref Range   Triglycerides 249 (H) <150 mg/dL  Fibrinogen     Status: Abnormal   Collection Time: 08/25/19  3:19 PM  Result Value Ref Range   Fibrinogen 797 (H) 210 - 475 mg/dL  C-reactive protein     Status: Abnormal   Collection Time: 08/25/19  3:19 PM  Result Value Ref Range   CRP 26.6 (H) <1.0 mg/dL  POC SARS Coronavirus 2 Ag-ED - Nasal Swab (BD Veritor Kit)     Status: Abnormal   Collection Time: 08/25/19  4:20 PM  Result Value Ref Range   SARS Coronavirus 2 Ag POSITIVE (A) NEGATIVE     MDM   PLAN: Plan for labs, will likely require admission.  MDM: Patient is Covid positive.  D-dimer is negative.  LDH, triglycerides and fibrinogen are elevated.  CMP shows slight bump in  creatinine of 1.36.  CBC shows no leukocytosis or anemia.  Chest x-ray shows multifocal pneumonia.  When initially arrived to the ED, he was satting between 89-92% but was very tachypneic and had increased work of breathing.  Patient sat down and had stayed at about 92% but was still working to breathe and had tachypnea.  He was placed on 1 L O2.  Given his Covid diagnosis as well as multifocal pneumonia, continued work of breathing, feel that he requires admission.  Discussed patient with Dr. Tyrell Antonio (hospitalist). Accepts patient for admission.   .Critical Care Performed by: Volanda Napoleon, PA-C Authorized by: Volanda Napoleon, PA-C   Critical care provider statement:    Critical care time (minutes):  35   Critical care was necessary to treat or prevent imminent or life-threatening deterioration of the following conditions:  Respiratory failure   Critical care was time spent personally by me on the following activities:  Discussions with consultants, evaluation of patient's response to treatment, examination of patient, ordering and performing treatments and interventions, ordering and review of laboratory studies, ordering and review of radiographic studies, pulse oximetry, re-evaluation of patient's condition, obtaining history from patient or surrogate and review of old charts      1. COVID-19   2. SOB (shortness of breath)   3. Pneumonia due to COVID-19 virus      Portions of this note were generated with [employer]. Dictation errors may occur despite best attempts at proofreading.   Volanda Napoleon, PA-C 08/25/19 1826    Margette Fast, MD 08/26/19 1017

## 2019-08-25 NOTE — ED Notes (Signed)
Patient given ginger ale. 

## 2019-08-25 NOTE — ED Triage Notes (Signed)
Patient c/o cough, SOB, and fever x 2 days. Patient states he has increased SOB when he walks or climbs stairs.

## 2019-08-25 NOTE — H&P (Signed)
History and Physical  Chalmer Zheng XFG:[id number] DOB: [birth year] DOA: 08/25/2019  PCP: [name], MD Patient coming from: Home  I have personally briefly reviewed patient's old medical records in [facility]   Chief Complaint: SOB  HPI: Basir Niven is a 54 y.o. male with past medical history significant for history of chronic prostatitis, morbid obesity, hyperlipidemia, diabetes, hypertension who presents complaining of worsening shortness of breath and chest pain that is started 3 days prior to admission.  He also report her day history of nausea and vomiting and abdominal pain for 5 days prior to admission.  Reports shortness of breath on exertion, he gets really short of breath walking around a climbing stair.  He report chest pain after he coughed.  Evaluation in the ED: Sodium 140 potassium 4.3, glucose 246, creatinine 1.3, liver function test normal, LDH 303, triglyceride 249, ferritin 594, CRP 26, lactic acid 2.5, procalcitonin 0.5, white blood cell 4.5, hemoglobin 15, fibrinogen 797, point-of-care marker for coronavirus 2+.  Chest x-ray: Patchy multifocal pneumonia.  Review of Systems: All systems reviewed and apart from history of presenting illness, are negative.   Past Medical History:  Diagnosis Date  . ED (erectile dysfunction) of organic origin   . Elevated PSA 2012   with L lobe induration on DRE, normal biopsy 2012 ([facility])  . Family history of prostate cancer    father, brother  . Genital herpes   . History of chronic prostatitis 2012   granulomatous, CXR done - negative  . History of hypertension   . HLD (hyperlipidemia)   . Morbid obesity (HCC)   . Seasonal allergies   . T2DM (type 2 diabetes mellitus) (HCC) 2010   lantus started by [facility]   Past Surgical History:  Procedure Laterality Date  . COLONOSCOPY  02/14/2018   at [facility] per patient - told normal  . ETT  2012   WNL ([facility])  . I & D EXTREMITY Right 12/03/2012   Procedure: IRRIGATION AND  DEBRIDEMENT RIGHT HAND ABSCESS ;  Surgeon: Tami Ribas, MD;  Location: [facility];  Service: Orthopedics;  Laterality: Right;  . PROSTATE BIOPSY  2012   granulomatous prostatitis   Social History:  reports that he has never smoked. He has never used smokeless tobacco. He reports current alcohol use. He reports that he does not use drugs.   Allergies  Allergen Reactions  . Flexeril [Cyclobenzaprine]     Causes Depression & Suicidal Thoughts    Family History  Problem Relation Age of Onset  . Cancer Father 27       colon, prostate  . Hypertension Father   . Diabetes Father   . Stroke Father 36  . Coronary artery disease Father 65       MI  . Cancer Brother 30       prostate  . Diabetes Mother   . Diabetes Sister     Prior to Admission medications   Medication Sig Start Date End Date Taking? Authorizing Provider  atorvastatin (LIPITOR) 40 MG tablet Take 1 tablet (40 mg total) by mouth daily at 6 PM. Patient not taking: Reported on 08/22/2019 05/30/19   [name], MD  clotrimazole (LOTRIMIN) 1 % cream APPLY TO AFFECTED AREA TWICE A DAY Patient not taking: Reported on 08/22/2019 04/01/19   [name], MD  glipiZIDE (GLUCOTROL) 10 MG tablet Take 1 tablet (10 mg total) by mouth 2 (two) times daily before lunch and supper. 08/21/19   Sharen Hones,  Garlon Hatchet, MD  glucose blood (ONE TOUCH ULTRA TEST) test strip Check blood sugar twice a day and as instructed. Dx 250.00 03/20/19   Ria Bush, MD  JARDIANCE 25 MG TABS tablet TAKE 1 TABLET BY MOUTH EVERY DAY Patient taking differently: Take 25 mg by mouth every morning.  07/16/19   Ria Bush, MD  meloxicam (MOBIC) 15 MG tablet Take 1 tablet (15 mg total) by mouth daily. Patient not taking: Reported on 08/22/2019 12/07/16   Ria Bush, MD  metFORMIN (GLUCOPHAGE) 500 MG tablet Take 1 tablet (500 mg total) by mouth 2 (two) times daily with a meal. 08/21/19   Ria Bush, MD  naproxen (NAPROSYN) 500 MG  tablet TAKE ONE TABLET BY MOUTH TWICE DAILY X1 WEEK THEN AS NEEDED FOR PAIN Patient taking differently: Take 500 mg by mouth 2 (two) times daily as needed for moderate pain.  05/09/19   Ria Bush, MD  pantoprazole (PROTONIX) 20 MG tablet Take 1 tablet (20 mg total) by mouth daily. 08/23/19   Antonietta Breach, PA-C  Semaglutide (OZEMPIC, 0.25 OR 0.5 MG/DOSE, [place]) Inject 0.5 mg into the skin every Sunday.    [provider]  sildenafil (VIAGRA) 100 MG tablet TAKE 0.5-1 TABLETS (50-100 MG TOTAL) BY MOUTH DAILY AS NEEDED FOR ERECTILE DYSFUNCTION. 07/16/19   Ria Bush, MD   Physical Exam: Vitals:   08/25/19 1128 08/25/19 1138 08/25/19 1142 08/25/19 1524  BP:    (!) 142/91  Pulse:    (!) 103  Resp:    (!) 21  Temp:      TempSrc:      SpO2:  93% 94% 93%  Weight: 135.2 kg     Height: 6\' 1"  (1.854 m)        General exam: Moderately built and nourished patient, lying comfortably supine on the gurney in no obvious distress.  Obese  Head, eyes and ENT: Nontraumatic and normocephalic. Pupils equally reacting to light and accommodation. Oral mucosa moist.  Neck: Supple. No JVD, carotid bruit or thyromegaly.  Lymphatics: No lymphadenopathy.  Respiratory system: Clear to auscultation. No increased work of breathing.  Cardiovascular system: S1 and S2 heard, RRR. No JVD, murmurs, gallops, clicks or pedal edema.  Gastrointestinal system: Abdomen is nondistended, soft and nontender. Normal bowel sounds heard. No organomegaly or masses appreciated.  Central nervous system: Alert and oriented. No focal neurological deficits.  Extremities: Symmetric 5 x 5 power. Peripheral pulses symmetrically felt.   Skin: No rashes or acute findings.  Musculoskeletal system: Negative exam.  Psychiatry: Pleasant and cooperative.   Labs on Admission:  Basic Metabolic Panel: Recent Labs  Lab 08/22/19 2229 08/25/19 1519  NA 140 140  K 4.3 4.3  CL 109 105  CO2 22 23  GLUCOSE 167* 246*   BUN 17 15  CREATININE 1.24 1.36*  CALCIUM 8.9 8.6*   Liver Function Tests: Recent Labs  Lab 08/22/19 2229 08/25/19 1519  AST 20 29  ALT 31 30  ALKPHOS 61 59  BILITOT 0.6 0.8  PROT 7.4 7.8  ALBUMIN 3.8 3.5   Recent Labs  Lab 08/22/19 2229  LIPASE 39   No results for input(s): AMMONIA in the last 168 hours. CBC: Recent Labs  Lab 08/22/19 2229 08/25/19 1519  WBC 5.2 4.5  NEUTROABS 3.5 3.3  HGB 15.6 15.7  HCT 47.2 48.0  MCV 92.5 93.6  PLT 175 175   Cardiac Enzymes: No results for input(s): CKTOTAL, CKMB, CKMBINDEX, TROPONINI in the last 168 hours.  BNP (last 3 results)  No results for input(s): PROBNP in the last 8760 hours. CBG: No results for input(s): GLUCAP in the last 168 hours.  Radiological Exams on Admission: DG Chest 2 View  Result Date: 08/25/2019 CLINICAL DATA:  Cough, shortness of breath and fever for 2 days. EXAM: CHEST - 2 VIEW COMPARISON:  08/22/2019 FINDINGS: The cardiac silhouette, mediastinal and hilar contours are stable. Low lung volumes with vascular crowding and atelectasis. Multifocal patchy infiltrates are noted, possible COVID pneumonia. Recommend clinical correlation. No pleural effusions. The bony thorax is intact. IMPRESSION: Patchy multifocal pneumonia. Electronically Signed   By: Rudie Meyer M.D.   On: 08/25/2019 12:27    EKG: Independently reviewed.  Sinus tachycardia  Assessment/Plan Principal Problem:   Pneumonia due to COVID-19 virus Active Problems:   Diabetes mellitus type 2, uncontrolled, with complications (HCC)   Hypertension, essential   Dyslipidemia associated with type 2 diabetes mellitus (HCC)   Obesity, morbid, BMI 40.0-49.9 (HCC)   Diabetic neuropathy (HCC)   Respiratory failure, acute (HCC)   1-Acute hypoxic respiratory failure secondary to COVID-19 pneumonia: New oxygen requirement.  Patient requiring 2 L of oxygen. Started Remdesivir and IV dexamethasone.  If patient develop worsening oxygen requirement  will need to proceed with Actemra.  I have discussed with patient risk and benefit of the medication. Continue with a azithromycin.  2-Diabetes type 2. We will start sliding scale insulin and low-dose Lantus. Hold Metformin and glipizide while inpatient. Check hemoglobin A1c.  3-Hyperlipidemia: Has not been taking Lipitor. Hold for now, monitor liver function test 4-morbid obesity: We will need diet education   DVT Prophylaxis: Lovenox Code Status: Full code Family Communication: Discussed with patient  disposition Plan: Admit to [facility] for treatment of acute respiratory failure secondary to COVID-19 pneumonia currently requiring 2 L of oxygen.  Time spent: 75 minutes.   Alba Cory MD [facility]   08/25/2019, 6:25 PM

## 2019-08-25 NOTE — Progress Notes (Signed)
pt states that he does not want insulin and refused it,  he takes metformin and glipizide only. Pt educaterd

## 2019-08-25 NOTE — ED Notes (Signed)
[facility] arrived for transport 

## 2019-08-25 NOTE — ED Notes (Signed)
Patient placed on 1L [place] per PA request.

## 2019-08-25 NOTE — Telephone Encounter (Signed)
Patient is back at the ER. Will await evaluation

## 2019-08-25 NOTE — Telephone Encounter (Signed)
[facility] Night - Client TELEPHONE ADVICE RECORD [facility] Patient Name: Jonathon [name] Gender: Male DOB: 1966/04/05 Age: 54 Y 11 M 12 D Return Phone Number: [phone] (Primary), [phone] (Secondary) Address[name] [address] Client [facility] Night - Client Client Site [facility] - Night Physician Jonathon [name] - MD Contact Type Call Who Is Calling Patient / Member / Family / Caregiver Call Type Triage / Clinical Caller Name Jonathon [name] Relationship To Patient Spouse Return Phone Number [phone] (Primary) Chief Complaint ABDOMINAL PAIN - Severe and only in abdomen Reason for Call Symptomatic / Request for Health Information Initial Comment Caller states her husband has excrutiating pain in his upper left abdomen. He had a fever last night. Translation No Nurse Assessment Nurse: Jonathon Pick, RN, Jonathon [name] Date/Time (Eastern Time): 08/22/2019 7:36:31 PM Confirm and document reason for call. If symptomatic, describe symptoms. ---Caller states that he is having 8/10 URQ pain, and is intermittent. Caller states he had a fever of 100.8 (oral) last night, and has not been feeling well for the past 4 days. Caller states that when he came home, the wife said he was sweaty, with a cold sweat. Caller still has his gallbladder. Caller states the current temp is 100.0 (oral) Has the patient had close contact with a person known or suspected to have the novel coronavirus illness OR traveled / lives in area with major community spread (including international travel) in the last 14 days from the onset of symptoms? * If Asymptomatic, screen for exposure and travel within the last 14 days. ---Yes Does the patient have any new or worsening symptoms? ---Yes Will a triage be completed? ---Yes Related visit to physician within the last 2 weeks? ---No Does the PT have any chronic conditions? (i.e.  diabetes, asthma, this includes High risk factors for pregnancy, etc.) ---Yes List chronic conditions. ---DM type 2, HTN (under control with medications) Is this a behavioral health or substance abuse call? ---No PLEASE NOTE: All timestamps contained within this report are represented as [place] Standard Time. CONFIDENTIALTY NOTICE: This fax transmission is intended only for the addressee. It contains information that is legally privileged, confidential or otherwise protected from use or disclosure. If you are not the intended recipient, you are strictly prohibited from reviewing, disclosing, copying using or disseminating any of this information or taking any action in reliance on or regarding this information. If you have received this fax in error, please notify us immediately by telephone so that we can arrange for its return to [place]. Phone: [phone], Toll-Free: [phone], Fax: [phone] Page: 2 of 2 Call Id: [id number] Guidelines Guideline Title Affirmed Question Affirmed Notes Nurse Date/Time Jonathon [name] Time) Abdominal Pain - Upper [1] SEVERE pain (e.g., excruciating) AND [2] present > 1 hour Jonathon Pick, RN, Jonathon [name] 08/22/2019 7:41:55 PM Disp. Time Jonathon [name] Time) Disposition Final User 08/22/2019 7:35:17 PM Send to Urgent Queue [name], Jonathon 08/22/2019 7:45:17 PM Go to ED Now Yes Jonathon Pick, RN, Jonathon [name] Disagree/Comply Comply Caller Understands Yes PreDisposition Call Doctor Care Advice Given Per Guideline GO TO ED NOW: * You need to be seen in the Emergency Department. * Go to the ED at ___________ Hospital. * Leave now. Drive carefully. DRIVING: Another adult should drive. Do not delay going to the Emergency Department. If immediate transportation is not available via car or taxi, then the patient should be instructed to call [id number]. BRING MEDICINES: * Please bring a list of your current medicines when you go to  the Emergency Department (ER). * It is also a good idea to  bring the pill bottles too. This will help the doctor to make certain you are taking the right medicines and the right dose. NOTHING BY MOUTH: Do not eat or drink anything for now. (Reason: condition may need surgery and general anesthesia.) CARE ADVICE given per Abdominal Pain, Upper (Adult) guideline. Comments User: Jonathon Jupiter, RN Date/Time Jonathon [name] Time): 08/22/2019 7:42:30 PM Caller states that he no longer taking HTN medications, and HTN is controlled at this time. Referrals [facility] - ED

## 2019-08-25 NOTE — ED Notes (Signed)
Gave telephone to report to [facility], going to room 113

## 2019-08-25 NOTE — ED Notes (Signed)
Hospitalist at bedside 

## 2019-08-25 NOTE — ED Provider Notes (Signed)
[facility]-EMERGENCY DEPT Provider Note   CSN: [id number] Arrival date & time: 08/25/19  1113     History Chief Complaint  Patient presents with  . Shortness of Breath  . Cough  . Fever    Jonathon [name] is a 54 y.o. male with a history of T2DM, hyperlipidemia, obesity, & hypertension who presents to the ED with complaints of dyspnea over the past 2 days.  Patient states that he feels fairly constantly short of breath, worse with activity, this is associated with fever to 102, dry cough, and generally not feeling well.  Other than activity no specific alleviating or aggravating factors.  No intervention prior to arrival.  He has not been around anybody with COVID-19 that he knows of.  He did have some nausea, vomiting, and abdominal pain over the weekend but this is since resolved.  Denies chest pain, dizziness, syncope, current abdominal pain/vomiting, diarrhea, melena, medic easier, or dysuria. Denies leg pain/swelling, hemoptysis, recent surgery/trauma, recent long travel, hormone use, personal hx of cancer, or hx of DVT/PE.    HPI     Past Medical History:  Diagnosis Date  . ED (erectile dysfunction) of organic origin   . Elevated PSA 2012   with L lobe induration on DRE, normal biopsy 2012 (Jonathon [name])  . Family history of prostate cancer    father, brother  . Genital herpes   . History of chronic prostatitis 2012   granulomatous, CXR done - negative  . History of hypertension   . HLD (hyperlipidemia)   . Morbid obesity (HCC)   . Seasonal allergies   . T2DM (type 2 diabetes mellitus) (HCC) 2010   lantus started by [facility]    Patient Active Problem List   Diagnosis Date Noted  . Health maintenance examination 05/30/2019  . Pain of right thumb 12/02/2018  . Right knee pain 12/07/2016  . Right sided sciatica 11/15/2016  . 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 Tinea pedis 08/17/2016  . Knee pain, left 05/10/2014  . Exposure to toxic chemical 03/04/2014   . Diabetic neuropathy (HCC) 03/04/2014  . Genital herpes   . Diabetes mellitus type 2, uncontrolled, with complications (HCC)   . Hypertension, essential   . Dyslipidemia associated with type 2 diabetes mellitus (HCC)   . Obesity, morbid, BMI 40.0-49.9 (HCC)   . ED (erectile dysfunction) of organic origin   . Family history of prostate cancer     Past Surgical History:  Procedure Laterality Date  . COLONOSCOPY  02/14/2018   at [facility] per patient - told normal  . ETT  2012   WNL (Jonathon [name])  . I & D EXTREMITY Right 12/03/2012   Procedure: IRRIGATION AND DEBRIDEMENT RIGHT HAND ABSCESS ;  Surgeon: Jonathon Ribas, MD;  Location: [facility];  Service: Orthopedics;  Laterality: Right;  . PROSTATE BIOPSY  2012   granulomatous prostatitis       Family History  Problem Relation Age of Onset  . Cancer Father 63       colon, prostate  . Hypertension Father   . Diabetes Father   . Stroke Father 2  . Coronary artery disease Father 15       MI  . Cancer Brother 43       prostate  . Diabetes Mother   . Diabetes Sister     Social History   Tobacco Use  . Smoking status: Never Smoker  . Smokeless tobacco: Never Used  Substance Use Topics  .  Alcohol use: Yes    Comment: occasional  . Drug use: No    Home Medications Prior to Admission medications   Medication Sig Start Date End Date Taking? Authorizing Provider  atorvastatin (LIPITOR) 40 MG tablet Take 1 tablet (40 mg total) by mouth daily at 6 PM. Patient not taking: Reported on 08/22/2019 05/30/19   Jonathon Boyden, MD  clotrimazole (LOTRIMIN) 1 % cream APPLY TO AFFECTED AREA TWICE A DAY Patient not taking: Reported on 08/22/2019 04/01/19   Jonathon Boyden, MD  glipiZIDE (GLUCOTROL) 10 MG tablet Take 1 tablet (10 mg total) by mouth 2 (two) times daily before lunch and supper. 08/21/19   Jonathon Boyden, MD  glucose blood (ONE TOUCH ULTRA TEST) test strip Check blood sugar twice a day and as instructed. Dx 250.00  03/20/19   Jonathon Boyden, MD  JARDIANCE 25 MG TABS tablet TAKE 1 TABLET BY MOUTH EVERY DAY Patient taking differently: Take 25 mg by mouth every morning.  07/16/19   Jonathon Boyden, MD  meloxicam (MOBIC) 15 MG tablet Take 1 tablet (15 mg total) by mouth daily. Patient not taking: Reported on 08/22/2019 12/07/16   Jonathon Boyden, MD  metFORMIN (GLUCOPHAGE) 500 MG tablet Take 1 tablet (500 mg total) by mouth 2 (two) times daily with a meal. 08/21/19   Jonathon Boyden, MD  naproxen (NAPROSYN) 500 MG tablet TAKE ONE TABLET BY MOUTH TWICE DAILY X1 WEEK THEN AS NEEDED FOR PAIN Patient taking differently: Take 500 mg by mouth 2 (two) times daily as needed for moderate pain.  05/09/19   Jonathon Boyden, MD  pantoprazole (PROTONIX) 20 MG tablet Take 1 tablet (20 mg total) by mouth daily. 08/23/19   Jonathon Madura, PA-C  Semaglutide (OZEMPIC, 0.25 OR 0.5 MG/DOSE, Jonathon [name]) Inject 0.5 mg into the skin every Sunday.    [provider]  sildenafil (VIAGRA) 100 MG tablet TAKE 0.5-1 TABLETS (50-100 MG TOTAL) BY MOUTH DAILY AS NEEDED FOR ERECTILE DYSFUNCTION. 07/16/19   Jonathon Boyden, MD    Allergies    Flexeril [cyclobenzaprine]  Review of Systems   Review of Systems Constitutional: Positive for chills, fatigue and fever.  Respiratory: Positive for cough and shortness of breath.   Cardiovascular: Negative for chest pain and leg swelling.  Gastrointestinal: Negative for abdominal pain, blood in stool, constipation, diarrhea, nausea and vomiting.  Genitourinary: Negative for enuresis.  Neurological: Negative for syncope.  All other systems reviewed and are negative.  Physical Exam Updated Vital Signs BP (!) 156/89 (BP Location: Right Arm)   Pulse (!) 102   Temp 99.7 F (37.6 C) (Oral)   Resp (!) 22   Ht 6\' 1"  (1.854 m)   Wt 135.2 kg   SpO2 94%   BMI 39.32 kg/m   Physical Exam Vitals and nursing note reviewed.  Constitutional:  General: He is not in acute distress. Appearance: He  is well-developed. He is not toxic-appearing.  HENT:  Head: Normocephalic and atraumatic.  Eyes:  General:  Right eye: No discharge.  Left eye: No discharge.  Conjunctiva/sclera: Conjunctivae normal.  Cardiovascular:  Rate and Rhythm: Normal rate and regular rhythm.  Pulmonary:  Effort: Pulmonary effort is normal. No respiratory distress.  Breath sounds: Normal breath sounds. No wheezing, rhonchi or rales.  Abdominal:  General: There is no distension.  Palpations: Abdomen is soft.  Tenderness: There is no abdominal tenderness.  Musculoskeletal:  Cervical back: Neck supple.  Right lower leg: No tenderness. No edema.  Left lower leg: No tenderness. No edema.  Skin:  General: Skin is warm and dry.  Findings: No rash.  Neurological:  Mental Status: He is alert.  Comments: Clear speech.  Psychiatric:  Behavior: Behavior normal.   ED Results / Procedures / Treatments   Labs (all labs ordered are listed, but only abnormal results are displayed) Labs Reviewed  CULTURE, BLOOD (ROUTINE X 2)  CULTURE, BLOOD (ROUTINE X 2)  LACTIC ACID, PLASMA  LACTIC ACID, PLASMA  CBC WITH DIFFERENTIAL/PLATELET  COMPREHENSIVE METABOLIC PANEL  D-DIMER, QUANTITATIVE (NOT AT [facility])  PROCALCITONIN  LACTATE DEHYDROGENASE  FERRITIN  TRIGLYCERIDES  FIBRINOGEN  C-REACTIVE PROTEIN  POC SARS CORONAVIRUS 2 AG -  ED    EKG None  Radiology DG Chest 2 View  Result Date: 08/25/2019 CLINICAL DATA:  Cough, shortness of breath and fever for 2 days. EXAM: CHEST - 2 VIEW COMPARISON:  08/22/2019 FINDINGS: The cardiac silhouette, mediastinal and hilar contours are stable. Low lung volumes with vascular crowding and atelectasis. Multifocal patchy infiltrates are noted, possible COVID pneumonia. Recommend clinical correlation. No pleural effusions. The bony thorax is intact. IMPRESSION: Patchy multifocal pneumonia. Electronically Signed   By: Marijo Sanes M.D.   On: 08/25/2019 12:27    Procedures Procedures  (including critical care time)  Medications Ordered in ED Medications  cefTRIAXone (ROCEPHIN) 1 g in sodium chloride 0.9 % 100 mL IVPB (has no administration in time range)  azithromycin (ZITHROMAX) 500 mg in sodium chloride 0.9 % 250 mL IVPB (has no administration in time range)  acetaminophen (TYLENOL) tablet 650 mg (650 mg Oral Given 08/25/19 1505)    ED Course  I have reviewed the triage vital signs and the nursing notes.  Pertinent labs & imaging results that were available during my care of the patient were reviewed by me and considered in my medical decision making (see chart for details).    Jonathon [name] was evaluated in Emergency Department on 08/25/2019 for the symptoms described in the history of present illness. He/she was evaluated in the context of the global COVID-19 pandemic, which necessitated consideration that the patient might be at risk for infection with the SARS-CoV-2 virus that causes COVID-19. Institutional protocols and algorithms that pertain to the evaluation of patients at risk for COVID-19 are in a state of rapid change based on information released by regulatory bodies including the CDC and federal and state organizations. These policies and algorithms were followed during the patient's care in the ED.  MDM Rules/Calculators/A&P                      Patient presents to the ED for evaluation of dyspnea.  Patient is nontoxic appearing, borderline oral temp 99.9 on my recheck, mildly tachycardic/tachypneic, and SpO2 ranging 89-93% on RA @ rest. Lungs without obvious adventitious sounds. CXR per triage with multifocal pneumonia. Place on 1L O2 via [place]. Will obtain covid swab & blood work, start abx for community acquired, anticipate admission. Discussed findings & plan of care with supervising physician Dr. Stark Jock- in agreement.   15:20: Patient care signed out to Providence Lanius PA-C at change of shift pending labs & disposition.    Final Clinical Impression(s) / ED  Diagnoses Final diagnoses:  None    Rx / DC Orders ED Discharge Orders    None       Amaryllis Dyke, PA-C 08/25/19 1521    Veryl Speak, MD 08/26/19 [phone]

## 2019-08-26 ENCOUNTER — Encounter: Payer: Self-pay | Admitting: Family Medicine

## 2019-08-26 DIAGNOSIS — J9601 Acute respiratory failure with hypoxia: Secondary | ICD-10-CM

## 2019-08-26 DIAGNOSIS — J1282 Pneumonia due to coronavirus disease 2019: Secondary | ICD-10-CM

## 2019-08-26 DIAGNOSIS — U071 COVID-19: Principal | ICD-10-CM

## 2019-08-26 DIAGNOSIS — E1165 Type 2 diabetes mellitus with hyperglycemia: Secondary | ICD-10-CM

## 2019-08-26 LAB — CBC WITH DIFFERENTIAL/PLATELET
Abs Immature Granulocytes: 0.01 10*3/uL (ref 0.00–0.07)
Basophils Absolute: 0 10*3/uL (ref 0.0–0.1)
Basophils Relative: 0 %
Eosinophils Absolute: 0 10*3/uL (ref 0.0–0.5)
Eosinophils Relative: 0 %
HCT: 46.6 % (ref 39.0–52.0)
Hemoglobin: 15.3 g/dL (ref 13.0–17.0)
Immature Granulocytes: 0 %
Lymphocytes Relative: 11 %
Lymphs Abs: 0.6 10*3/uL — ABNORMAL LOW (ref 0.7–4.0)
MCH: 30.3 pg (ref 26.0–34.0)
MCHC: 32.8 g/dL (ref 30.0–36.0)
MCV: 92.3 fL (ref 80.0–100.0)
Monocytes Absolute: 0.1 10*3/uL (ref 0.1–1.0)
Monocytes Relative: 2 %
Neutro Abs: 4.8 10*3/uL (ref 1.7–7.7)
Neutrophils Relative %: 87 %
Platelets: 190 10*3/uL (ref 150–400)
RBC: 5.05 MIL/uL (ref 4.22–5.81)
RDW: 14.4 % (ref 11.5–15.5)
WBC: 5.5 10*3/uL (ref 4.0–10.5)
nRBC: 0 % (ref 0.0–0.2)

## 2019-08-26 LAB — COMPREHENSIVE METABOLIC PANEL
ALT: 24 U/L (ref 0–44)
AST: 26 U/L (ref 15–41)
Albumin: 3.2 g/dL — ABNORMAL LOW (ref 3.5–5.0)
Alkaline Phosphatase: 61 U/L (ref 38–126)
Anion gap: 13 (ref 5–15)
BUN: 15 mg/dL (ref 6–20)
CO2: 22 mmol/L (ref 22–32)
Calcium: 8.3 mg/dL — ABNORMAL LOW (ref 8.9–10.3)
Chloride: 104 mmol/L (ref 98–111)
Creatinine, Ser: 1.19 mg/dL (ref 0.61–1.24)
GFR calc Af Amer: >60 mL/min (ref >60)
GFR calc non Af Amer: >60 mL/min (ref >60)
Glucose, Bld: 262 mg/dL — ABNORMAL HIGH (ref 70–99)
Potassium: 4.5 mmol/L (ref 3.5–5.1)
Sodium: 139 mmol/L (ref 135–145)
Total Bilirubin: 0.8 mg/dL (ref 0.3–1.2)
Total Protein: 7.2 g/dL (ref 6.5–8.1)

## 2019-08-26 LAB — ABO/RH: ABO/RH(D): A POS

## 2019-08-26 LAB — MAGNESIUM: Magnesium: 1.9 mg/dL (ref 1.7–2.4)

## 2019-08-26 LAB — D-DIMER, QUANTITATIVE: D-Dimer, Quant: 0.68 ug/mL-FEU — ABNORMAL HIGH (ref 0.00–0.50)

## 2019-08-26 LAB — GLUCOSE, CAPILLARY
Glucose-Capillary: 213 mg/dL — ABNORMAL HIGH (ref 70–99)
Glucose-Capillary: 304 mg/dL — ABNORMAL HIGH (ref 70–99)
Glucose-Capillary: 555 mg/dL (ref 70–99)
Glucose-Capillary: 273 mg/dL — ABNORMAL HIGH (ref 70–99)

## 2019-08-26 LAB — HEMOGLOBIN A1C
Hgb A1c MFr Bld: 8.2 % — ABNORMAL HIGH (ref 4.8–5.6)
Mean Plasma Glucose: 189 mg/dL

## 2019-08-26 LAB — C-REACTIVE PROTEIN: CRP: 28.6 mg/dL — ABNORMAL HIGH (ref <1.0)

## 2019-08-26 LAB — HIV ANTIBODY (ROUTINE TESTING W REFLEX): HIV Screen 4th Generation wRfx: NONREACTIVE

## 2019-08-26 MED ORDER — INSULIN ASPART 100 UNIT/ML ~~LOC~~ SOLN
6.0000 [IU] | Freq: Three times a day (TID) | SUBCUTANEOUS | Status: DC
  Administered 2019-08-26 – 2019-09-02 (×21): 6 [IU] via SUBCUTANEOUS

## 2019-08-26 MED ORDER — INSULIN ASPART 100 UNIT/ML ~~LOC~~ SOLN
25.0000 [IU] | Freq: Once | SUBCUTANEOUS | Status: AC
  Administered 2019-08-26: 17:00:00 25 [IU] via SUBCUTANEOUS

## 2019-08-26 MED ORDER — DEXAMETHASONE SODIUM PHOSPHATE 10 MG/ML IJ SOLN
6.0000 mg | Freq: Two times a day (BID) | INTRAMUSCULAR | Status: DC
  Administered 2019-08-26 – 2019-08-31 (×12): 6 mg via INTRAVENOUS
  Filled 2019-08-26 (×12): qty 1

## 2019-08-26 MED ORDER — INSULIN ASPART 100 UNIT/ML ~~LOC~~ SOLN
0.0000 [IU] | Freq: Every day | SUBCUTANEOUS | Status: DC
  Administered 2019-08-26: 22:00:00 3 [IU] via SUBCUTANEOUS
  Administered 2019-08-27: 21:00:00 5 [IU] via SUBCUTANEOUS
  Administered 2019-08-28: 21:00:00 2 [IU] via SUBCUTANEOUS
  Administered 2019-08-29: 21:00:00 3 [IU] via SUBCUTANEOUS
  Administered 2019-08-30 – 2019-08-31 (×2): 4 [IU] via SUBCUTANEOUS
  Administered 2019-09-01: 5 [IU] via SUBCUTANEOUS

## 2019-08-26 MED ORDER — INSULIN ASPART 100 UNIT/ML ~~LOC~~ SOLN
25.0000 [IU] | Freq: Once | SUBCUTANEOUS | Status: AC
  Administered 2019-08-26: 18:00:00 25 [IU] via SUBCUTANEOUS

## 2019-08-26 MED ORDER — INSULIN GLARGINE 100 UNIT/ML ~~LOC~~ SOLN
20.0000 [IU] | Freq: Every day | SUBCUTANEOUS | Status: DC
  Administered 2019-08-26 – 2019-08-27 (×2): 20 [IU] via SUBCUTANEOUS
  Filled 2019-08-26 (×2): qty 0.2

## 2019-08-26 MED ORDER — INSULIN ASPART 100 UNIT/ML ~~LOC~~ SOLN
0.0000 [IU] | Freq: Three times a day (TID) | SUBCUTANEOUS | Status: DC
  Administered 2019-08-26: 12:00:00 15 [IU] via SUBCUTANEOUS
  Administered 2019-08-27: 19:00:00 20 [IU] via SUBCUTANEOUS
  Administered 2019-08-27: 08:00:00 4 [IU] via SUBCUTANEOUS
  Administered 2019-08-27: 12:00:00 11 [IU] via SUBCUTANEOUS
  Administered 2019-08-28 – 2019-08-29 (×4): 7 [IU] via SUBCUTANEOUS
  Administered 2019-08-29: 13:00:00 4 [IU] via SUBCUTANEOUS
  Administered 2019-08-29: 7 [IU] via SUBCUTANEOUS
  Administered 2019-08-30: 13:00:00 4 [IU] via SUBCUTANEOUS
  Administered 2019-08-30: 08:00:00 3 [IU] via SUBCUTANEOUS
  Administered 2019-08-30: 11 [IU] via SUBCUTANEOUS
  Administered 2019-08-31: 7 [IU] via SUBCUTANEOUS
  Administered 2019-08-31: 20 [IU] via SUBCUTANEOUS
  Administered 2019-08-31: 09:00:00 4 [IU] via SUBCUTANEOUS
  Administered 2019-09-01: 12:00:00 7 [IU] via SUBCUTANEOUS
  Administered 2019-09-01: 17:00:00 20 [IU] via SUBCUTANEOUS
  Administered 2019-09-01: 08:00:00 7 [IU] via SUBCUTANEOUS
  Administered 2019-09-02: 08:00:00 3 [IU] via SUBCUTANEOUS
  Administered 2019-09-02: 12:00:00 7 [IU] via SUBCUTANEOUS

## 2019-08-26 MED ORDER — ACETAMINOPHEN 325 MG PO TABS
650.0000 mg | ORAL_TABLET | Freq: Four times a day (QID) | ORAL | Status: DC | PRN
  Administered 2019-08-26 – 2019-09-01 (×11): 650 mg via ORAL
  Filled 2019-08-26 (×11): qty 2

## 2019-08-26 MED ORDER — SODIUM CHLORIDE 0.9 % IV SOLN
1.0000 g | INTRAVENOUS | Status: DC
  Administered 2019-08-26 – 2019-08-27 (×2): 1 g via INTRAVENOUS
  Filled 2019-08-26 (×2): qty 10

## 2019-08-26 NOTE — Progress Notes (Signed)
Pt b/g rechecked , at 1930 hrs, on call notified

## 2019-08-26 NOTE — Plan of Care (Signed)
  Problem: Education: Goal: Knowledge of risk factors and measures for prevention of condition will improve Outcome: Progressing   Problem: Coping: Goal: Psychosocial and spiritual needs will be supported Outcome: Progressing   Problem: Respiratory: Goal: Will maintain a patent airway Outcome: Progressing Goal: Complications related to the disease process, condition or treatment will be avoided or minimized Outcome: Progressing   Problem: Education: Goal: Ability to describe self-care measures that may prevent or decrease complications (Diabetes Survival Skills Education) will improve Outcome: Progressing Goal: Individualized Educational Video(s) Outcome: Progressing   Problem: Coping: Goal: Ability to adjust to condition or change in health will improve Outcome: Progressing   Problem: Fluid Volume: Goal: Ability to maintain a balanced intake and output will improve Outcome: Progressing   Problem: Health Behavior/Discharge Planning: Goal: Ability to identify and utilize available resources and services will improve Outcome: Progressing Goal: Ability to manage health-related needs will improve Outcome: Progressing   Problem: Metabolic: Goal: Ability to maintain appropriate glucose levels will improve Outcome: Progressing   Problem: Nutritional: Goal: Maintenance of adequate nutrition will improve Outcome: Progressing Goal: Progress toward achieving an optimal weight will improve Outcome: Progressing   Problem: Skin Integrity: Goal: Risk for impaired skin integrity will decrease Outcome: Progressing   Problem: Tissue Perfusion: Goal: Adequacy of tissue perfusion will improve Outcome: Progressing   

## 2019-08-26 NOTE — Progress Notes (Addendum)
Notified dr Rito Ehrlich of patient's confirmed 450 blood sugar. Going to give patient 20 units sliding scale per order, and see what the MD orders. Will call back in 30 mins if no response.   Dr. Rito Ehrlich ordered 25 units insulin now plus 6 for meal coverage. And he upped the patient's lantus to 20 units now

## 2019-08-26 NOTE — Progress Notes (Signed)
PROGRESS NOTE  Sandra Tellefsen WHQ:[id number] DOB: [birth year] DOA: 08/25/2019  PCP: [name], MD  Brief History/Interval Summary: 54 y.o. male with past medical history significant for history of chronic prostatitis, morbid obesity, hyperlipidemia, diabetes, hypertension who presented complaining of worsening shortness of breath and chest pain that started 3 days prior to admission.    Patient was noted to be hypoxic.  Chest x-ray showed multifocal pneumonia.  He was hospitalized for further management.  COVID-19 test was positive.   Reason for Visit: Pneumonia due to COVID-19.  Acute respiratory failure with hypoxia  Consultants: None  Procedures: None  Antibiotics: Anti-infectives (From admission, onward)   Start     Dose/Rate Route Frequency Ordered Stop   08/26/19 1600  azithromycin (ZITHROMAX) 500 mg in sodium chloride 0.9 % 250 mL IVPB     500 mg 250 mL/hr over 60 Minutes Intravenous Every 24 hours 08/25/19 1829     08/26/19 1000  remdesivir 100 mg in sodium chloride 0.9 % 100 mL IVPB     100 mg 200 mL/hr over 30 Minutes Intravenous Daily 08/25/19 1732 08/30/19 0959   08/25/19 1800  remdesivir 200 mg in sodium chloride 0.9% 250 mL IVPB     200 mg 580 mL/hr over 30 Minutes Intravenous Once 08/25/19 1732 08/25/19 2141   08/25/19 1500  cefTRIAXone (ROCEPHIN) 1 g in sodium chloride 0.9 % 100 mL IVPB     1 g 200 mL/hr over 30 Minutes Intravenous  Once 08/25/19 1440 08/25/19 1653   08/25/19 1500  azithromycin (ZITHROMAX) 500 mg in sodium chloride 0.9 % 250 mL IVPB     500 mg 250 mL/hr over 60 Minutes Intravenous  Once 08/25/19 1440 08/25/19 1823      Subjective/Interval History: States that he continues to have shortness of breath and dry cough.  No change from yesterday.  Denies any chest pain.  No nausea vomiting.  Feels fatigued.    Assessment/Plan:  Acute Hypoxic Resp. Failure/Pneumonia due to COVID-19   Recent Labs  Lab 08/22/19 2229 08/25/19 1519  08/26/19 0329  DDIMER  --  0.45 0.68*  FERRITIN  --  594*  --   CRP  --  26.6* 28.6*  ALT 31 30 24   PROCALCITON  --  0.57  --     Objective findings: Fever: Temperature of 101 Fahrenheit yesterday evening.  None since then. Oxygen requirements: Nasal cannula 5 L/min.  Saturating in the early 90s.  COVID 19 Therapeutics: Antibacterials: Ceftriaxone and azithromycin Remdesivir: Day 2 Steroids: Dexamethasone once a day.  Will change to twice a day. Diuretics: None at this time Actemra or baricitinib: Due to elevated procalcitonin we are holding off for now. Convalescent Plasma: Not available currently due to change in FDA EUA. Vitamin C and Zinc: Continue PUD Prophylaxis: Protonix DVT Prophylaxis:  Lovenox  From a respiratory standpoint patient is stable.  He is requiring 5 L of oxygen.  Does not have increased work of breathing.  He is on remdesivir and steroids.  Due to elevated procalcitonin and the fact that he is on antibacterials we will hold off on Actemra or baricitinib.  These were discussed in detail with the patient.  He was told about the experimental nature of these medications.  If his clinical status were to get worse then he is open to receiving these medications understanding the risks.  In the meantime we will increase the dose of his steroids.  His CRP is noted to be elevated.  D-dimer is slightly elevated  at 0.68.  Continue with incentive spirometry, mobilization, out of bed to chair.  Diabetes mellitus type 2, uncontrolled with hyperglycemia On Metformin and glipizide at home which is being held.  Elevated CBGs due to steroids.  Continue SSI and Lantus.  HbA1c 8.2.  Hyperlipidemia Apparently has not been taking his Lipitor.  Holding for now.  Obesity Estimated body mass index is 39.32 kg/m as calculated from the following:   Height as of this encounter: 6\' 1"  (1.854 m).   Weight as of this encounter: 135.2 kg.   DVT Prophylaxis: Lovenox Code Status: Full  code Family Communication: Dicussed with the patient Disposition Plan: Mobilize.  He is here from home.  Hopefully should be able to go back home when better.   Medications:  Scheduled: . albuterol  2 puff Inhalation Q6H  . vitamin C  500 mg Oral Daily  . dexamethasone (DECADRON) injection  6 mg Intravenous Q24H  . enoxaparin (LOVENOX) injection  40 mg Subcutaneous Q24H  . insulin aspart  0-15 Units Subcutaneous TID WC  . insulin aspart  0-5 Units Subcutaneous QHS  . insulin glargine  15 Units Subcutaneous QHS  . pantoprazole  20 mg Oral Daily  . zinc sulfate  220 mg Oral Daily   Continuous: . azithromycin    . remdesivir 100 mg in NS 100 mL 100 mg (08/26/19 1039)   10/24/19, chlorpheniramine-HYDROcodone, guaiFENesin-dextromethorphan, ondansetron **OR** ondansetron (ZOFRAN) IV   Objective:  Vital Signs  Vitals:   08/25/19 2000 08/25/19 2130 08/25/19 2246 08/26/19 0755  BP: (!) 134/98 (!) 145/93 (!) 149/94 (!) 141/100  Pulse: (!) 113 (!) 114  98  Resp: (!) 33 (!) 28 20 18   Temp:   98.9 F (37.2 C) 99.3 F (37.4 C)  TempSrc:   Oral Oral  SpO2: (!) 89% 90% 90% (!) 89%  Weight:   135.2 kg   Height:   6\' 1"  (1.854 m)     Intake/Output Summary (Last 24 hours) at 08/26/2019 1047 Last data filed at 08/26/2019 0759 Gross per 24 hour  Intake 250 ml  Output 1480 ml  Net -1230 ml   Filed Weights   08/25/19 1128 08/25/19 2246  Weight: 135.2 kg 135.2 kg    General appearance: Awake alert.  In no distress Resp: Mildly tachypneic at rest.  Coarse breath sound bilaterally with crackles bilateral bases.  No wheezing or rhonchi. Cardio: S1-S2 is normal regular.  No S3-S4.  No rubs murmurs or bruit GI: Abdomen is soft.  Nontender nondistended.  Bowel sounds are present normal.  No masses organomegaly Extremities: No edema.  Full range of motion of lower extremities. Neurologic: Alert and oriented x3.  No focal neurological deficits.    Lab Results:  Data Reviewed: I  have personally reviewed following labs and imaging studies  CBC: Recent Labs  Lab 08/22/19 2229 08/25/19 1519 08/26/19 0329  WBC 5.2 4.5 5.5  NEUTROABS 3.5 3.3 4.8  HGB 15.6 15.7 15.3  HCT 47.2 48.0 46.6  MCV 92.5 93.6 92.3  PLT 175 175 190    Basic Metabolic Panel: Recent Labs  Lab 08/22/19 2229 08/25/19 1519 08/26/19 0329  NA 140 140 139  K 4.3 4.3 4.5  CL 109 105 104  CO2 22 23 22   GLUCOSE 167* 246* 262*  BUN 17 15 15   CREATININE 1.24 1.36* 1.19  CALCIUM 8.9 8.6* 8.3*  MG  --   --  1.9    GFR: Estimated Creatinine Clearance: 101.2 mL/min (by C-G formula based on  SCr of 1.19 mg/dL).  Liver Function Tests: Recent Labs  Lab 08/22/19 2229 08/25/19 1519 08/26/19 0329  AST 20 29 26   ALT 31 30 24   ALKPHOS 61 59 61  BILITOT 0.6 0.8 0.8  PROT 7.4 7.8 7.2  ALBUMIN 3.8 3.5 3.2*    Recent Labs  Lab 08/22/19 2229  LIPASE 39    HbA1C: Recent Labs    08/25/19 1519  HGBA1C 8.2*    CBG: Recent Labs  Lab 08/25/19 2316 08/26/19 0752  GLUCAP 212* 213*    Lipid Profile: Recent Labs    08/25/19 1519  TRIG 249*    Anemia Panel: Recent Labs    08/25/19 1519  FERRITIN 594*    Recent Results (from the past 240 hour(s))  Blood Culture (routine x 2)     Status: None (Preliminary result)   Collection Time: 08/25/19  3:19 PM   Specimen: Site Not Specified; Blood  Result Value Ref Range Status   Specimen Description   Final    SITE NOT SPECIFIED Performed at [facility], [place] [address], [place], [place] [address]    Special Requests   Final    BOTTLES DRAWN AEROBIC ONLY Blood Culture results may not be optimal due to an inadequate volume of blood received in culture bottles Performed at [facility] [address] [place], [place] [address]    Culture   Final    NO GROWTH < 24 HOURS Performed at [facility] [address], [place], [place] [address]    Report Status PENDING  Incomplete  Blood  Culture (routine x 2)     Status: None (Preliminary result)   Collection Time: 08/25/19  3:19 PM   Specimen: Site Not Specified; Blood  Result Value Ref Range Status   Specimen Description   Final    SITE NOT SPECIFIED Performed at [facility] [address], [place], [place] [address]    Special Requests   Final    BOTTLES DRAWN AEROBIC AND ANAEROBIC Blood Culture adequate volume Performed at [facility] [address], [place], [place] [address]    Culture   Final    NO GROWTH < 24 HOURS Performed at [facility] [address], [place], [place] [address]    Report Status PENDING  Incomplete      Radiology Studies: DG Chest 2 View  Result Date: 08/25/2019 CLINICAL DATA:  Cough, shortness of breath and fever for 2 days. EXAM: CHEST - 2 VIEW COMPARISON:  08/22/2019 FINDINGS: The cardiac silhouette, mediastinal and hilar contours are stable. Low lung volumes with vascular crowding and atelectasis. Multifocal patchy infiltrates are noted, possible COVID pneumonia. Recommend clinical correlation. No pleural effusions. The bony thorax is intact. IMPRESSION: Patchy multifocal pneumonia. Electronically Signed   By: Marijo Sanes M.D.   On: 08/25/2019 12:27       LOS: 1 day   [facility] Pager on [email or web address]  08/26/2019, 10:47 AM

## 2019-08-26 NOTE — Progress Notes (Signed)
Rechecked patients blood sugar after 1800 and was consistently in the 470s. Dr Rito Ehrlich ordered another 25 units of novolog and asked for the sugar to be rechecked in one hour. Gave at 1825, so will recheck at 1925. Will pass along to night shift.

## 2019-08-26 NOTE — Plan of Care (Signed)
  Problem: Education: Goal: Knowledge of risk factors and measures for prevention of condition will improve Outcome: Progressing   Problem: Coping: Goal: Psychosocial and spiritual needs will be supported Outcome: Progressing   Problem: Respiratory: Goal: Will maintain a patent airway Outcome: Progressing   Problem: Education: Goal: Ability to describe self-care measures that may prevent or decrease complications (Diabetes Survival Skills Education) will improve Outcome: Progressing

## 2019-08-26 NOTE — Plan of Care (Signed)
  Problem: Education: Goal: Knowledge of risk factors and measures for prevention of condition will improve Outcome: Progressing   Problem: Coping: Goal: Psychosocial and spiritual needs will be supported Outcome: Progressing   Problem: Respiratory: Goal: Will maintain a patent airway Outcome: Progressing   Problem: Education: Goal: Ability to describe self-care measures that may prevent or decrease complications (Diabetes Survival Skills Education) will improve Outcome: Progressing   Problem: Skin Integrity: Goal: Risk for impaired skin integrity will decrease Outcome: Progressing   Problem: Tissue Perfusion: Goal: Adequacy of tissue perfusion will improve Outcome: Progressing

## 2019-08-27 ENCOUNTER — Inpatient Hospital Stay (HOSPITAL_COMMUNITY): Payer: No Typology Code available for payment source

## 2019-08-27 LAB — C-REACTIVE PROTEIN: CRP: 20.5 mg/dL — ABNORMAL HIGH (ref <1.0)

## 2019-08-27 LAB — COMPREHENSIVE METABOLIC PANEL
ALT: 24 U/L (ref 0–44)
AST: 30 U/L (ref 15–41)
Albumin: 3.2 g/dL — ABNORMAL LOW (ref 3.5–5.0)
Alkaline Phosphatase: 50 U/L (ref 38–126)
Anion gap: 11 (ref 5–15)
BUN: 30 mg/dL — ABNORMAL HIGH (ref 6–20)
CO2: 25 mmol/L (ref 22–32)
Calcium: 8.3 mg/dL — ABNORMAL LOW (ref 8.9–10.3)
Chloride: 105 mmol/L (ref 98–111)
Creatinine, Ser: 1.26 mg/dL — ABNORMAL HIGH (ref 0.61–1.24)
GFR calc Af Amer: >60 mL/min (ref >60)
GFR calc non Af Amer: >60 mL/min (ref >60)
Glucose, Bld: 183 mg/dL — ABNORMAL HIGH (ref 70–99)
Potassium: 4.4 mmol/L (ref 3.5–5.1)
Sodium: 141 mmol/L (ref 135–145)
Total Bilirubin: 0.3 mg/dL (ref 0.3–1.2)
Total Protein: 7.2 g/dL (ref 6.5–8.1)

## 2019-08-27 LAB — CBC WITH DIFFERENTIAL/PLATELET
Abs Immature Granulocytes: 0.02 10*3/uL (ref 0.00–0.07)
Basophils Absolute: 0 10*3/uL (ref 0.0–0.1)
Basophils Relative: 0 %
Eosinophils Absolute: 0 10*3/uL (ref 0.0–0.5)
Eosinophils Relative: 0 %
HCT: 44.4 % (ref 39.0–52.0)
Hemoglobin: 14.4 g/dL (ref 13.0–17.0)
Immature Granulocytes: 0 %
Lymphocytes Relative: 16 %
Lymphs Abs: 0.9 10*3/uL (ref 0.7–4.0)
MCH: 29.9 pg (ref 26.0–34.0)
MCHC: 32.4 g/dL (ref 30.0–36.0)
MCV: 92.3 fL (ref 80.0–100.0)
Monocytes Absolute: 0.3 10*3/uL (ref 0.1–1.0)
Monocytes Relative: 6 %
Neutro Abs: 4.5 10*3/uL (ref 1.7–7.7)
Neutrophils Relative %: 78 %
Platelets: 244 10*3/uL (ref 150–400)
RBC: 4.81 MIL/uL (ref 4.22–5.81)
RDW: 14.5 % (ref 11.5–15.5)
WBC: 5.8 10*3/uL (ref 4.0–10.5)
nRBC: 0 % (ref 0.0–0.2)

## 2019-08-27 LAB — GLUCOSE, CAPILLARY
Glucose-Capillary: 450 mg/dL — ABNORMAL HIGH (ref 70–99)
Glucose-Capillary: 478 mg/dL — ABNORMAL HIGH (ref 70–99)
Glucose-Capillary: 163 mg/dL — ABNORMAL HIGH (ref 70–99)
Glucose-Capillary: 255 mg/dL — ABNORMAL HIGH (ref 70–99)
Glucose-Capillary: 397 mg/dL — ABNORMAL HIGH (ref 70–99)
Glucose-Capillary: 399 mg/dL — ABNORMAL HIGH (ref 70–99)

## 2019-08-27 LAB — MAGNESIUM: Magnesium: 2.3 mg/dL (ref 1.7–2.4)

## 2019-08-27 LAB — D-DIMER, QUANTITATIVE: D-Dimer, Quant: 0.51 ug/mL-FEU — ABNORMAL HIGH (ref 0.00–0.50)

## 2019-08-27 MED ORDER — ENOXAPARIN SODIUM 80 MG/0.8ML ~~LOC~~ SOLN
0.5000 mg/kg | SUBCUTANEOUS | Status: DC
  Administered 2019-08-27 – 2019-08-28 (×2): 70 mg via SUBCUTANEOUS
  Filled 2019-08-27 (×2): qty 0.8

## 2019-08-27 MED ORDER — METFORMIN HCL 500 MG PO TABS
500.0000 mg | ORAL_TABLET | Freq: Two times a day (BID) | ORAL | Status: DC
  Administered 2019-08-27 – 2019-09-02 (×12): 500 mg via ORAL
  Filled 2019-08-27 (×12): qty 1

## 2019-08-27 MED ORDER — CANAGLIFLOZIN 100 MG PO TABS
100.0000 mg | ORAL_TABLET | Freq: Every day | ORAL | Status: DC
  Administered 2019-08-28 – 2019-09-02 (×6): 100 mg via ORAL
  Filled 2019-08-27 (×7): qty 1

## 2019-08-27 MED ORDER — EMPAGLIFLOZIN 25 MG PO TABS
25.0000 mg | ORAL_TABLET | Freq: Every day | ORAL | Status: DC
  Filled 2019-08-27: qty 25

## 2019-08-27 NOTE — Progress Notes (Signed)
Hospitalist progress note   Patient from home, Patient going home, Dispo likely home discharge in the next 1 to 2 days  Jonathon [name] [id number] DOB: 1966-03-06 DOA: 08/25/2019  PCP: [name], MD   Narrative:  31 black male BMI >40, chronic prostatitis, DM TY 2 on oral medications, HTN, reflux-admitted on 08/24/2018 1S OB, CP, N, V, abdominal pain 5 days prior to admission CXR = multifocal PNA  Data Reviewed:  BUN/creatinine 17/1.2-->35/1.26 LFTs normal Hemoglobin 14.4, WBC 35.8  Assessment & Plan:  Coronavirus pneumonia Remdesivir, dexamethasone started 2/8 with complete on 2/12 Continue azithromycin and ceftriaxone for possible superimposed pneumonia-CRP levels very elevated CRP Repeat CXR 2/10 shows no specific changes Needing high flow nasal cannula--attempt to down titrate over the next several days BMI >40 Needs dedicated weight loss counseling etc. DM TY 2 Continue Lantus 20 units in addition to sliding scale coverage seems to have had high sugars overnight 1 63-73 Adjust as needed-resuming Metformin 500 twice daily today in addition to Jardiance will hold glipizide for now given the risk of hypoglycemia but can resume the same soon Reflux Continue pantoprazole 20 daily Fungal intertrigo Hold meds for now Erectile dysfunction Hold meds for now  Patient capable of updating family members himself-doing stable no new issues  Subjective: Awake coherent pleasan tin na dno focal deficit eating and drinking Probe was apparently on ear when day nurse came on and oxygen sats seemed much better when replaced STOP-BANG done and was neg Consultants:    Objective: Vitals:   08/26/19 2200 08/27/19 0044 08/27/19 0300 08/27/19 0759  BP:  117/84 121/79 121/85  Pulse:  84 89 88  Resp: 17 16 17 19   Temp:  97.6 F (36.4 C) 98.1 F (36.7 C) 97.9 F (36.6 C)  TempSrc:  Oral Oral Oral  SpO2:  90% 90% (!) 87%  Weight:      Height:        Intake/Output Summary (Last 24 hours)  at 08/27/2019 10/25/2019 Last data filed at 08/27/2019 0100 Gross per 24 hour  Intake 1584.4 ml  Output --  Net 1584.4 ml   Filed Weights   08/25/19 1128 08/25/19 2246  Weight: 135.2 kg 135.2 kg    Examination:   Pleasant eomi nca tno focal defitic cta b no adde dsoudn no ralkes no rhonchi  abd soft nt nd no rebound no guard  Neuro intact trace le edema  S1-S2 no murmur rub or gallop  Thick neck    Scheduled Meds: . albuterol  2 puff Inhalation Q6H  . vitamin C  500 mg Oral Daily  . dexamethasone (DECADRON) injection  6 mg Intravenous Q12H  . enoxaparin (LOVENOX) injection  40 mg Subcutaneous Q24H  . insulin aspart  0-20 Units Subcutaneous TID WC  . insulin aspart  0-5 Units Subcutaneous QHS  . insulin aspart  6 Units Subcutaneous TID WC  . insulin glargine  20 Units Subcutaneous QHS  . pantoprazole  20 mg Oral Daily  . zinc sulfate  220 mg Oral Daily   Continuous Infusions: . azithromycin Stopped (08/26/19 1700)  . cefTRIAXone (ROCEPHIN)  IV Stopped (08/26/19 1215)  . remdesivir 100 mg in NS 100 mL Stopped (08/26/19 1130)     LOS: 2 days   Time spent: 28 22, MD [facility]  08/27/2019, 9:07 AM

## 2019-08-27 NOTE — Plan of Care (Signed)
  Problem: Education: Goal: Knowledge of risk factors and measures for prevention of condition will improve Outcome: Progressing   Problem: Coping: Goal: Psychosocial and spiritual needs will be supported Outcome: Progressing   Problem: Respiratory: Goal: Will maintain a patent airway Outcome: Progressing   

## 2019-08-27 NOTE — Progress Notes (Signed)
Inpatient Diabetes Program Recommendations  AACE/ADA: New Consensus Statement on Inpatient Glycemic Control (2015)  Target Ranges:  Prepandial:   less than 140 mg/dL      Peak postprandial:   less than 180 mg/dL (1-2 hours)      Critically ill patients:  140 - 180 mg/dL   Lab Results  Component Value Date   GLUCAP 255 (H) 08/27/2019   HGBA1C 8.2 (H) 08/25/2019    Review of Glycemic Control Results for Jonathon [name], Jonathon [name] (MRN [id number]) as of 08/27/2019 14:07  Ref. Range 08/26/2019 19:25 08/26/2019 22:27 08/27/2019 07:56 08/27/2019 11:51  Glucose-Capillary Latest Ref Range: 70 - 99 mg/dL 728 (HH) 979 (H) 150 (H) 255 (H)   Diabetes history: Type 2 DM Outpatient Diabetes medications: Glipizide 10 mg BID, Jardiance 25 mg QAM, Metformin 500 mg BID Current orders for Inpatient glycemic control: Lantus 20 units QHS, Novolog 6 units TID, Novolog 0-20 units TID, Novolog 0-5 units QHS, Metformin 500 mg BID, Jardiance 25 mg QD Decadron 6 mg BID  Inpatient Diabetes Program Recommendations:    In addition to adding orals, Consider increasing Lantus to 15 units BID. Total of short acting on 2/9 was >70 units.   Thanks, Lujean Rave, MSN, RNC-OB Diabetes Coordinator [phone] (8a-5p)

## 2019-08-28 LAB — CBC WITH DIFFERENTIAL/PLATELET
Abs Immature Granulocytes: 0.07 10*3/uL (ref 0.00–0.07)
Basophils Absolute: 0 10*3/uL (ref 0.0–0.1)
Basophils Relative: 0 %
Eosinophils Absolute: 0 10*3/uL (ref 0.0–0.5)
Eosinophils Relative: 0 %
HCT: 45.5 % (ref 39.0–52.0)
Hemoglobin: 14.8 g/dL (ref 13.0–17.0)
Immature Granulocytes: 1 %
Lymphocytes Relative: 16 %
Lymphs Abs: 1.2 10*3/uL (ref 0.7–4.0)
MCH: 30.3 pg (ref 26.0–34.0)
MCHC: 32.5 g/dL (ref 30.0–36.0)
MCV: 93.2 fL (ref 80.0–100.0)
Monocytes Absolute: 0.5 10*3/uL (ref 0.1–1.0)
Monocytes Relative: 6 %
Neutro Abs: 5.7 10*3/uL (ref 1.7–7.7)
Neutrophils Relative %: 77 %
Platelets: 306 10*3/uL (ref 150–400)
RBC: 4.88 MIL/uL (ref 4.22–5.81)
RDW: 14.4 % (ref 11.5–15.5)
WBC: 7.5 10*3/uL (ref 4.0–10.5)
nRBC: 0 % (ref 0.0–0.2)

## 2019-08-28 LAB — COMPREHENSIVE METABOLIC PANEL
ALT: 30 U/L (ref 0–44)
AST: 31 U/L (ref 15–41)
Albumin: 3.3 g/dL — ABNORMAL LOW (ref 3.5–5.0)
Alkaline Phosphatase: 48 U/L (ref 38–126)
Anion gap: 14 (ref 5–15)
BUN: 29 mg/dL — ABNORMAL HIGH (ref 6–20)
CO2: 23 mmol/L (ref 22–32)
Calcium: 8.4 mg/dL — ABNORMAL LOW (ref 8.9–10.3)
Chloride: 105 mmol/L (ref 98–111)
Creatinine, Ser: 1.13 mg/dL (ref 0.61–1.24)
GFR calc Af Amer: >60 mL/min (ref >60)
GFR calc non Af Amer: >60 mL/min (ref >60)
Glucose, Bld: 199 mg/dL — ABNORMAL HIGH (ref 70–99)
Potassium: 4.3 mmol/L (ref 3.5–5.1)
Sodium: 142 mmol/L (ref 135–145)
Total Bilirubin: 0.3 mg/dL (ref 0.3–1.2)
Total Protein: 7.3 g/dL (ref 6.5–8.1)

## 2019-08-28 LAB — GLUCOSE, CAPILLARY
Glucose-Capillary: 222 mg/dL — ABNORMAL HIGH (ref 70–99)
Glucose-Capillary: 216 mg/dL — ABNORMAL HIGH (ref 70–99)
Glucose-Capillary: 238 mg/dL — ABNORMAL HIGH (ref 70–99)
Glucose-Capillary: 247 mg/dL — ABNORMAL HIGH (ref 70–99)

## 2019-08-28 LAB — MAGNESIUM: Magnesium: 2.4 mg/dL (ref 1.7–2.4)

## 2019-08-28 LAB — D-DIMER, QUANTITATIVE: D-Dimer, Quant: 0.96 ug/mL-FEU — ABNORMAL HIGH (ref 0.00–0.50)

## 2019-08-28 LAB — C-REACTIVE PROTEIN: CRP: 11.4 mg/dL — ABNORMAL HIGH (ref <1.0)

## 2019-08-28 MED ORDER — INSULIN GLARGINE 100 UNIT/ML ~~LOC~~ SOLN
28.0000 [IU] | Freq: Every day | SUBCUTANEOUS | Status: DC
  Administered 2019-08-28 – 2019-09-01 (×5): 28 [IU] via SUBCUTANEOUS
  Filled 2019-08-28 (×6): qty 0.28

## 2019-08-28 MED ORDER — AZITHROMYCIN 250 MG PO TABS
500.0000 mg | ORAL_TABLET | Freq: Every day | ORAL | Status: DC
  Administered 2019-08-28 – 2019-08-29 (×2): 500 mg via ORAL
  Filled 2019-08-28 (×2): qty 2

## 2019-08-28 NOTE — Progress Notes (Signed)
Patient ambulated 40-62ft total with O2 at 2L [place]. O2 sats at the lowest was 78% and highest at 89% while ambulating.

## 2019-08-28 NOTE — Plan of Care (Signed)
  Problem: Education: Goal: Knowledge of risk factors and measures for prevention of condition will improve Outcome: Progressing   Problem: Coping: Goal: Psychosocial and spiritual needs will be supported Outcome: Progressing   Problem: Respiratory: Goal: Will maintain a patent airway Outcome: Progressing   

## 2019-08-28 NOTE — Progress Notes (Signed)
Hospitalist progress note   Patient from home, Patient going home eventually-disposition-not ready for discharge will hypoxic and not doing as well as expected  Jonathon [name] [id number] DOB: [birth year] DOA: 08/25/2019  PCP: [name], MD   Narrative:  38 black male BMI >40, chronic prostatitis, DM TY 2 on oral medications, HTN, reflux-admitted on 08/24/2018 1S OB, CP, N, V, abdominal pain 5 days prior to admission CXR = multifocal PNA  Data Reviewed:  BUN/creatinine 17/1.2-->35/1.26-->29/1.1 LFTs normal  WBC 5.8-->7.5  Assessment & Plan:  Coronavirus pneumonia Remdesivir, dexamethasone started 2/8 with complete on 2/12 Transition from ceftriaxone/azithromycin IV-->azithromycin p.o. Continue to attempt to wean oxygen-ambulate etc.-not ready for discharge BMI >40 Needs dedicated weight loss counseling etc. DM TY 2 CBG 300 range yesterday-increase Lantus to 28 Continuing Metformin 500 twice daily today in addition to Jardiance Glipizide to be resumed on discharge-May require insulin if continues to be above 250 despite Reflux Continue pantoprazole 20 daily Fungal intertrigo Hold meds for now Erectile dysfunction Hold meds for now  No family members present-patient has updated  Subjective: Patient had a rough night in addition states that walking around 20 feet he felt significantly winded He was not really able to sleep last night asked for the oxygen to be put back up to 3 L Not having chest pain States when sugars get high he feels swollen in his lower extremities but does not use any fluid medications like Lasix Consultants:    Objective: Vitals:   08/27/19 1932 08/27/19 2335 08/28/19 0332 08/28/19 0753  BP: (!) 143/87 (!) 146/94 (!) 137/94 139/86  Pulse: 96 95 88 92  Resp:  20 18 18   Temp: 98.4 F (36.9 C) (!) 97.5 F (36.4 C) 98.5 F (36.9 C) 97.7 F (36.5 C)  TempSrc: Oral Oral Oral Oral  SpO2: 95% 96% 94% (!) 89%  Weight:      Height:         Intake/Output Summary (Last 24 hours) at 08/28/2019 0905 Last data filed at 08/27/2019 1926 Gross per 24 hour  Intake 0 ml  Output 0 ml  Net 0 ml   Filed Weights   08/25/19 1128 08/25/19 2246  Weight: 135.2 kg 135.2 kg    Examination:   Awake coherent no distress EOMI NCAT no focal deficit neck soft supple Chest clear no added sound no rales no rhonchi Abdomen soft no rebound no guarding ROM intact moving all 4 limbs equally smile symmetric Neurologically intact with no focal deficit Mild edema grade 1   Scheduled Meds: . albuterol  2 puff Inhalation Q6H  . vitamin C  500 mg Oral Daily  . azithromycin  500 mg Oral Daily  . canagliflozin  100 mg Oral QAC breakfast  . dexamethasone (DECADRON) injection  6 mg Intravenous Q12H  . enoxaparin (LOVENOX) injection  0.5 mg/kg Subcutaneous Q24H  . insulin aspart  0-20 Units Subcutaneous TID WC  . insulin aspart  0-5 Units Subcutaneous QHS  . insulin aspart  6 Units Subcutaneous TID WC  . insulin glargine  28 Units Subcutaneous QHS  . metFORMIN  500 mg Oral BID WC  . pantoprazole  20 mg Oral Daily  . zinc sulfate  220 mg Oral Daily   Continuous Infusions: . remdesivir 100 mg in NS 100 mL 100 mg (08/27/19 1012)     LOS: 3 days   Time spent: 53 22, MD [facility]  08/28/2019, 9:05 AM

## 2019-08-29 ENCOUNTER — Inpatient Hospital Stay (HOSPITAL_COMMUNITY): Payer: No Typology Code available for payment source

## 2019-08-29 DIAGNOSIS — I82409 Acute embolism and thrombosis of unspecified deep veins of unspecified lower extremity: Secondary | ICD-10-CM

## 2019-08-29 LAB — COMPREHENSIVE METABOLIC PANEL
ALT: 38 U/L (ref 0–44)
AST: 48 U/L — ABNORMAL HIGH (ref 15–41)
Albumin: 3.3 g/dL — ABNORMAL LOW (ref 3.5–5.0)
Alkaline Phosphatase: 59 U/L (ref 38–126)
Anion gap: 12 (ref 5–15)
BUN: 31 mg/dL — ABNORMAL HIGH (ref 6–20)
CO2: 23 mmol/L (ref 22–32)
Calcium: 8.3 mg/dL — ABNORMAL LOW (ref 8.9–10.3)
Chloride: 107 mmol/L (ref 98–111)
Creatinine, Ser: 1.08 mg/dL (ref 0.61–1.24)
GFR calc Af Amer: >60 mL/min (ref >60)
GFR calc non Af Amer: >60 mL/min (ref >60)
Glucose, Bld: 193 mg/dL — ABNORMAL HIGH (ref 70–99)
Potassium: 4.1 mmol/L (ref 3.5–5.1)
Sodium: 142 mmol/L (ref 135–145)
Total Bilirubin: 0.5 mg/dL (ref 0.3–1.2)
Total Protein: 7.2 g/dL (ref 6.5–8.1)

## 2019-08-29 LAB — CBC WITH DIFFERENTIAL/PLATELET
Abs Immature Granulocytes: 0.17 10*3/uL — ABNORMAL HIGH (ref 0.00–0.07)
Basophils Absolute: 0.1 10*3/uL (ref 0.0–0.1)
Basophils Relative: 1 %
Eosinophils Absolute: 0 10*3/uL (ref 0.0–0.5)
Eosinophils Relative: 0 %
HCT: 47.3 % (ref 39.0–52.0)
Hemoglobin: 15.3 g/dL (ref 13.0–17.0)
Immature Granulocytes: 2 %
Lymphocytes Relative: 14 %
Lymphs Abs: 1.1 10*3/uL (ref 0.7–4.0)
MCH: 30.1 pg (ref 26.0–34.0)
MCHC: 32.3 g/dL (ref 30.0–36.0)
MCV: 92.9 fL (ref 80.0–100.0)
Monocytes Absolute: 0.5 10*3/uL (ref 0.1–1.0)
Monocytes Relative: 6 %
Neutro Abs: 6.3 10*3/uL (ref 1.7–7.7)
Neutrophils Relative %: 77 %
Platelets: 258 10*3/uL (ref 150–400)
RBC: 5.09 MIL/uL (ref 4.22–5.81)
RDW: 14.4 % (ref 11.5–15.5)
WBC: 8.1 10*3/uL (ref 4.0–10.5)
nRBC: 0 % (ref 0.0–0.2)

## 2019-08-29 LAB — GLUCOSE, CAPILLARY
Glucose-Capillary: 208 mg/dL — ABNORMAL HIGH (ref 70–99)
Glucose-Capillary: 151 mg/dL — ABNORMAL HIGH (ref 70–99)
Glucose-Capillary: 225 mg/dL — ABNORMAL HIGH (ref 70–99)
Glucose-Capillary: 259 mg/dL — ABNORMAL HIGH (ref 70–99)

## 2019-08-29 LAB — D-DIMER, QUANTITATIVE
D-Dimer, Quant: >20 ug/mL-FEU — ABNORMAL HIGH (ref 0.00–0.50)
D-Dimer, Quant: >20 ug/mL-FEU — ABNORMAL HIGH (ref 0.00–0.50)

## 2019-08-29 LAB — C-REACTIVE PROTEIN: CRP: 6.2 mg/dL — ABNORMAL HIGH (ref <1.0)

## 2019-08-29 MED ORDER — ENOXAPARIN SODIUM 80 MG/0.8ML ~~LOC~~ SOLN
65.0000 mg | Freq: Once | SUBCUTANEOUS | Status: AC
  Administered 2019-08-29: 13:00:00 65 mg via SUBCUTANEOUS
  Filled 2019-08-29: qty 0.8

## 2019-08-29 MED ORDER — ENOXAPARIN SODIUM 150 MG/ML ~~LOC~~ SOLN
1.0000 mg/kg | Freq: Two times a day (BID) | SUBCUTANEOUS | Status: DC
  Administered 2019-08-29: 135 mg via SUBCUTANEOUS
  Filled 2019-08-29 (×2): qty 0.9

## 2019-08-29 MED ORDER — ENOXAPARIN SODIUM 80 MG/0.8ML ~~LOC~~ SOLN
0.5000 mg/kg | Freq: Two times a day (BID) | SUBCUTANEOUS | Status: DC
  Administered 2019-08-29: 70 mg via SUBCUTANEOUS
  Filled 2019-08-29: qty 0.8

## 2019-08-29 MED ORDER — IOHEXOL 350 MG/ML SOLN
75.0000 mL | Freq: Once | INTRAVENOUS | Status: AC | PRN
  Administered 2019-08-29: 12:00:00 75 mL via INTRAVENOUS

## 2019-08-29 NOTE — Progress Notes (Signed)
Hospitalist progress note   Patient from home, Patient going home eventually-disposition-likely d/c am on eliquis  Jonathon [name] [id number] DOB: July 21, 1965 DOA: 08/25/2019  PCP: [name], MD   Narrative:  37 black male BMI >40, chronic prostatitis, DM TY 2 on oral medications, HTN, reflux-admitted on 08/24/2018 1S OB, CP, N, V, abdominal pain 5 days prior to admission CXR = multifocal PNA Found to have bilaterl LE dvt's  Data Reviewed:  BUN/creatinine 17/1.2-->35/1.26-->29/1.1 LFTs normal  WBC 5.8-->7.5 CT chest negative for PE 2/12 LE duplex + for DVT bilaterally 2/12  Assessment & Plan:  Coronavirus pneumonia Remdesivir, dexamethasone started 2/8 with complete on 2/12 Transition from ceftriaxone/azithromycin IV-->azithromycin p.o. Continue to attempt to wean oxygen-ambulate etc.-not ready for discharge Acute bilateral lower extremity DVt's  Started on Lovenox DVT present--will need ~ 3 mo of DVt rx transition to po liquids in am Will need TOC input  BMI >40 Needs dedicated weight loss counseling etc. DM TY 2 CBG 151-200increase Lantus to 28 Continuing Metformin 500 twice daily today in addition to Jardiance Glipizide to be resumed on discharge- Reflux Continue pantoprazole 20 daily Fungal intertrigo Hold meds for now Erectile dysfunction Hold meds for now  No family members present-patient has updated  Subjective:  Some swelling of feet Overall feels better  no cp no fever some SOB noted on walking yesterday Feels improved  Consultants:    Objective: Vitals:   08/29/19 0556 08/29/19 0611 08/29/19 0757 08/29/19 1233  BP:   (!) 143/94   Pulse:   82   Resp: 17 (!) 24 (!) 22 (!) 22  Temp:   98.8 F (37.1 C) 97.9 F (36.6 C)  TempSrc:   Oral   SpO2:   94% 91%  Weight:      Height:        Intake/Output Summary (Last 24 hours) at 08/29/2019 1439 Last data filed at 08/29/2019 0175 Gross per 24 hour  Intake 480 ml  Output 0 ml  Net 480 ml   Filed  Weights   08/25/19 1128 08/25/19 2246  Weight: 135.2 kg 135.2 kg    Examination:   eomi nca tno focal deficit Thick neck ncat cta b abd obese nt nd no rebound no guard Mild le edema rom intact  Scheduled Meds: . albuterol  2 puff Inhalation Q6H  . vitamin C  500 mg Oral Daily  . azithromycin  500 mg Oral Daily  . canagliflozin  100 mg Oral QAC breakfast  . dexamethasone (DECADRON) injection  6 mg Intravenous Q12H  . enoxaparin (LOVENOX) injection  1 mg/kg Subcutaneous Q12H  . insulin aspart  0-20 Units Subcutaneous TID WC  . insulin aspart  0-5 Units Subcutaneous QHS  . insulin aspart  6 Units Subcutaneous TID WC  . insulin glargine  28 Units Subcutaneous QHS  . metFORMIN  500 mg Oral BID WC  . pantoprazole  20 mg Oral Daily  . zinc sulfate  220 mg Oral Daily   Continuous Infusions:    LOS: 4 days   Time spent: 27 Pleas Koch, MD [facility]  08/29/2019, 2:39 PM

## 2019-08-29 NOTE — Progress Notes (Signed)
Venous duplex lower ext  has been completed. Refer to [facility] under chart review to view preliminary results.   08/29/2019  3:13 PM Jonathon [name], Gerarda Gunther

## 2019-08-29 NOTE — Progress Notes (Signed)
ANTICOAGULATION CONSULT NOTE - Initial Consult  Pharmacy Consult for enoxaparin Indication: VTE treatment  Allergies  Allergen Reactions  . Flexeril [Cyclobenzaprine]     Causes Depression & Suicidal Thoughts    Patient Measurements: Height: 6\' 1"  (185.4 cm) Weight: 298 lb (135.2 kg) IBW/kg (Calculated) : 79.9   Vital Signs: Temp: 98.8 F (37.1 C) (02/12 0757) Temp Source: Oral (02/12 0757) BP: 143/94 (02/12 0757) Pulse Rate: 82 (02/12 0757)  Labs: Recent Labs    08/27/19 0300 08/27/19 0300 08/28/19 0235 08/29/19 0107  HGB 14.4   < > 14.8 15.3  HCT 44.4  --  45.5 47.3  PLT 244  --  306 258  CREATININE 1.26*  --  1.13 1.08   < > = values in this interval not displayed.    Estimated Creatinine Clearance: 111.5 mL/min (by C-G formula based on SCr of 1.08 mg/dL).   Medical History: Past Medical History:  Diagnosis Date  . ED (erectile dysfunction) of organic origin   . Elevated PSA 2012   with L lobe induration on DRE, normal biopsy 2012 ([facility])  . Family history of prostate cancer    father, brother  . Genital herpes   . History of chronic prostatitis 2012   granulomatous, CXR done - negative  . History of hypertension   . HLD (hyperlipidemia)   . Morbid obesity (HCC)   . Pneumonia due to COVID-19 virus 08/2019  . Seasonal allergies   . T2DM (type 2 diabetes mellitus) (HCC) 2010   lantus started by [facility]    Assessment: 54 y.o. male with a history of T2DM, hyperlipidemia, obesity, & hypertension who presents to the ED with complaints of dyspnea over the past 2 days. Patient noted to have DDimer of >20 today, up from 0.96 on admit. Patient was receiving intermediate enoxaparin for obesity and elevated DDimer. Last dose of 70 mg was at 1057 this AM. MD now wishes to increase to treatment dosing for presumed VTE.   Goal of Therapy:  Anti-Xa level 0.6-1 units/ml 4hrs after LMWH dose given Monitor platelets by anticoagulation protocol: Yes   Plan:  Enoxaparin  65 mg SQ x 1 now and then 135mg  SQ q12h Monitor bleeding F/u VTE w/u    Nam Vossler A. 54, PharmD, BCPS, FNKF Clinical Pharmacist [place] Please utilize Amion for appropriate phone number to reach the unit pharmacist [facility])   08/29/2019,12:14 PM

## 2019-08-29 NOTE — Plan of Care (Signed)
Pt RR elevated upon AM assessment. Changed out tele leads as pt did not seem to be in any acute distress. Pt states he feel better this morning and denies SOB. Will continue to monitor. Pt sating at 94% on 3 nasal cannula.

## 2019-08-29 NOTE — Progress Notes (Signed)
Inpatient Diabetes Program Recommendations  AACE/ADA: New Consensus Statement on Inpatient Glycemic Control (2015)  Target Ranges:  Prepandial:   less than 140 mg/dL      Peak postprandial:   less than 180 mg/dL (1-2 hours)      Critically ill patients:  140 - 180 mg/dL   Lab Results  Component Value Date   GLUCAP 247 (H) 08/28/2019   HGBA1C 8.2 (H) 08/25/2019    Review of Glycemic Control Results for KIRE, FERG (MRN [id number]) as of 08/29/2019 09:17  Ref. Range 08/28/2019 07:30 08/28/2019 11:51 08/28/2019 16:56 08/28/2019 20:09  Glucose-Capillary Latest Ref Range: 70 - 99 mg/dL 952 (H) 841 (H) 324 (H) 247 (H)   Diabetes history: Type 2 DM Outpatient Diabetes medications: Glipizide 10 mg BID, Jardiance 25 mg QAM, Metformin 500 mg BID Current orders for Inpatient glycemic control: Lantus 28 units QHS, Novolog 6 units TID, Novolog 0-20 units TID, Novolog 0-5 units QHS, Metformin 500 mg BID, Invokana 100 mg QD Decadron 6 mg BID  Inpatient Diabetes Program Recommendations:    Glucose trends continue to exceed 200's mg/dL, consider increasing Lantus to 34 units QHS.   Thanks, Lujean Rave, MSN, RNC-OB Diabetes Coordinator [phone] (8a-5p)

## 2019-08-29 NOTE — Plan of Care (Signed)
  Problem: Education: Goal: Knowledge of risk factors and measures for prevention of condition will improve Outcome: Progressing   Problem: Coping: Goal: Psychosocial and spiritual needs will be supported Outcome: Progressing   Problem: Respiratory: Goal: Will maintain a patent airway Outcome: Progressing   

## 2019-08-30 ENCOUNTER — Encounter: Payer: Self-pay | Admitting: Family Medicine

## 2019-08-30 LAB — COMPREHENSIVE METABOLIC PANEL
ALT: 32 U/L (ref 0–44)
AST: 32 U/L (ref 15–41)
Albumin: 3.6 g/dL (ref 3.5–5.0)
Alkaline Phosphatase: 68 U/L (ref 38–126)
Anion gap: 14 (ref 5–15)
BUN: 28 mg/dL — ABNORMAL HIGH (ref 6–20)
CO2: 23 mmol/L (ref 22–32)
Calcium: 8.9 mg/dL (ref 8.9–10.3)
Chloride: 103 mmol/L (ref 98–111)
Creatinine, Ser: 1.13 mg/dL (ref 0.61–1.24)
GFR calc Af Amer: >60 mL/min (ref >60)
GFR calc non Af Amer: >60 mL/min (ref >60)
Glucose, Bld: 209 mg/dL — ABNORMAL HIGH (ref 70–99)
Potassium: 4.8 mmol/L (ref 3.5–5.1)
Sodium: 140 mmol/L (ref 135–145)
Total Bilirubin: 1.8 mg/dL — ABNORMAL HIGH (ref 0.3–1.2)
Total Protein: 7.2 g/dL (ref 6.5–8.1)

## 2019-08-30 LAB — CULTURE, BLOOD (ROUTINE X 2)
Culture: NO GROWTH
Culture: NO GROWTH
Special Requests: ADEQUATE

## 2019-08-30 LAB — CBC WITH DIFFERENTIAL/PLATELET
Abs Immature Granulocytes: 0.44 10*3/uL — ABNORMAL HIGH (ref 0.00–0.07)
Basophils Absolute: 0 10*3/uL (ref 0.0–0.1)
Basophils Relative: 0 %
Eosinophils Absolute: 0 10*3/uL (ref 0.0–0.5)
Eosinophils Relative: 0 %
HCT: 50.8 % (ref 39.0–52.0)
Hemoglobin: 16.7 g/dL (ref 13.0–17.0)
Immature Granulocytes: 4 %
Lymphocytes Relative: 16 %
Lymphs Abs: 1.7 10*3/uL (ref 0.7–4.0)
MCH: 30.7 pg (ref 26.0–34.0)
MCHC: 32.9 g/dL (ref 30.0–36.0)
MCV: 93.4 fL (ref 80.0–100.0)
Monocytes Absolute: 0.7 10*3/uL (ref 0.1–1.0)
Monocytes Relative: 6 %
Neutro Abs: 7.7 10*3/uL (ref 1.7–7.7)
Neutrophils Relative %: 74 %
Platelets: 276 10*3/uL (ref 150–400)
RBC: 5.44 MIL/uL (ref 4.22–5.81)
RDW: 14.3 % (ref 11.5–15.5)
WBC: 10.6 10*3/uL — ABNORMAL HIGH (ref 4.0–10.5)
nRBC: 0.3 % — ABNORMAL HIGH (ref 0.0–0.2)

## 2019-08-30 LAB — GLUCOSE, CAPILLARY
Glucose-Capillary: 142 mg/dL — ABNORMAL HIGH (ref 70–99)
Glucose-Capillary: 164 mg/dL — ABNORMAL HIGH (ref 70–99)
Glucose-Capillary: 267 mg/dL — ABNORMAL HIGH (ref 70–99)

## 2019-08-30 LAB — C-REACTIVE PROTEIN: CRP: 6.6 mg/dL — ABNORMAL HIGH (ref <1.0)

## 2019-08-30 LAB — D-DIMER, QUANTITATIVE: D-Dimer, Quant: >20 ug/mL-FEU — ABNORMAL HIGH (ref 0.00–0.50)

## 2019-08-30 MED ORDER — APIXABAN 5 MG PO TABS: 5.0000 mg | ORAL_TABLET | Freq: Two times a day (BID) | ORAL | 0 refills | Status: DC

## 2019-08-30 MED ORDER — APIXABAN 5 MG PO TABS
10.0000 mg | ORAL_TABLET | Freq: Two times a day (BID) | ORAL | Status: DC
  Administered 2019-08-30 – 2019-09-02 (×7): 10 mg via ORAL
  Filled 2019-08-30 (×7): qty 2

## 2019-08-30 MED ORDER — APIXABAN 5 MG PO TABS: 5.0000 mg | ORAL_TABLET | Freq: Two times a day (BID) | ORAL | Status: DC

## 2019-08-30 MED ORDER — APIXABAN 5 MG PO TABS: 10.0000 mg | ORAL_TABLET | Freq: Two times a day (BID) | ORAL | 0 refills | Status: DC

## 2019-08-30 MED ORDER — DEXAMETHASONE 6 MG PO TABS: 6.0000 mg | ORAL_TABLET | Freq: Every day | ORAL | 0 refills | Status: DC

## 2019-08-30 NOTE — TOC Progression Note (Signed)
Transition of Care [facility]) - Progression Note    Patient Details  Name: Jonathon [name] MRN: [id number] Date of Birth: 29-Nov-1965  Transition of Care [facility] CM/SW Contact  Huston Foley Jacklynn Ganong, RN Phone Number: 08/30/2019, 10:22 AM  Clinical Narrative:  Case manager contacted patient via telephone to discuss need for oxygen. Based on his saturations he does not need it his sats are in the 90's. No further needs identified.          Expected Discharge Plan and Services           Expected Discharge Date: 08/30/19                                     Social Determinants of Health (SDOH) Interventions    Readmission Risk Interventions No flowsheet data found.

## 2019-08-30 NOTE — Progress Notes (Signed)
ANTICOAGULATION CONSULT NOTE - Follow Up Consult  Pharmacy Consult for Apixaban Indication: RLE DVT  Allergies  Allergen Reactions  . Flexeril [Cyclobenzaprine]     Causes Depression & Suicidal Thoughts    Patient Measurements: Height: 6\' 1"  (185.4 cm) Weight: 298 lb (135.2 kg) IBW/kg (Calculated) : 79.9   Vital Signs: Temp: 98.1 F (36.7 C) (02/13 0755) Temp Source: Oral (02/13 0755) BP: 145/98 (02/13 0755) Pulse Rate: 88 (02/13 0755)  Labs: Recent Labs    08/28/19 0235 08/28/19 0235 08/29/19 0107 08/30/19 0100  HGB 14.8   < > 15.3 16.7  HCT 45.5  --  47.3 50.8  PLT 306  --  258 276  CREATININE 1.13  --  1.08 1.13   < > = values in this interval not displayed.    Estimated Creatinine Clearance: 106.6 mL/min (by C-G formula based on SCr of 1.13 mg/dL).   Medications:  Scheduled:  . albuterol  2 puff Inhalation Q6H  . vitamin C  500 mg Oral Daily  . canagliflozin  100 mg Oral QAC breakfast  . dexamethasone (DECADRON) injection  6 mg Intravenous Q12H  . insulin aspart  0-20 Units Subcutaneous TID WC  . insulin aspart  0-5 Units Subcutaneous QHS  . insulin aspart  6 Units Subcutaneous TID WC  . insulin glargine  28 Units Subcutaneous QHS  . metFORMIN  500 mg Oral BID WC  . pantoprazole  20 mg Oral Daily  . zinc sulfate  220 mg Oral Daily   Infusions:    Assessment: 54 yo M being treated for COVID-19 pneumonia.  Patient with noted D-dimer >20.  Vasc ultrasound showing RLE DVT.  Was previously on treatment dose Lovenox.  To transition to VTE treatment dose apixaban.  Pt has good renal function and no noted bleeding.   Goal of Therapy:  Therapeutic Anticoagulation Monitor platelets by anticoagulation protocol: Yes   Plan:  D/C Lovenox.  Last dose was 2/12 at 2100. Start Apixaban 10mg  PO BID x 7 days followed by 5mg  BID thereafter. Monitor for s/sx bleeding.  [id number], Pharm.D., BCPS Clinical Pharmacist  08/30/2019 8:22 AM

## 2019-08-30 NOTE — Evaluation (Signed)
Physical Therapy Evaluation Patient Details Name: Jonathon [name] MRN: [id number] DOB: April 15, 1966 Today's Date: 08/30/2019   History of Present Illness  10 black male BMI >40, chronic prostatitis, DM TY 2 on oral medications, HTN, reflux-admitted on 08/24/2018 1S OB, CP, N, V, abdominal pain 5 days prior to admission,CXR = multifocal PNA, RLE DVT.  Clinical Impression  The patient was monitored while stepping up and down 1 step x 23 times . Patient remained on RA with SPO2 dropping to low 80's with quick return to 88% about every 8-10 steps.  Required patient about  5 minutes to perform the 23 steps. Patient with 3/4 dyspnea at peak exertion. Patient paces self well. Patient plans to get to second floor and stay there as needed. Previously, per notes, SPO2 remains in 90's with level ambulation and has been deemed to not require O2. Will notify MD in regards to stairs. No further PT needs at this time.   Follow Up Recommendations No PT follow up    Equipment Recommendations  None recommended by PT    Recommendations for Other Services       Precautions / Restrictions Precautions Precautions: None Restrictions Weight Bearing Restrictions: No      Mobility  Bed Mobility Overal bed mobility: Independent                Transfers Overall transfer level: Independent                  Ambulation/Gait Ambulation/Gait assistance: Independent           General Gait Details: pt up adlib, reports ambulated length of hall and back~ 250' on RA  Stairs            Wheelchair Mobility    Modified Rankin (Stroke Patients Only)       Balance Overall balance assessment: Independent                                           Pertinent Vitals/Pain      Home Living Family/patient expects to be discharged to:: Private residence   Available Help at Discharge: Family Type of Home: House Home Access: Level entry     Home Layout: Two level Home  Equipment: None      Prior Function Level of Independence: Independent               Hand Dominance        Extremity/Trunk Assessment   Upper Extremity Assessment Upper Extremity Assessment: Overall WFL for tasks assessed    Lower Extremity Assessment Lower Extremity Assessment: Overall WFL for tasks assessed    Cervical / Trunk Assessment Cervical / Trunk Assessment: Normal  Communication   Communication: No difficulties  Cognition Arousal/Alertness: Awake/alert Behavior During Therapy: WFL for tasks assessed/performed Overall Cognitive Status: Within Functional Limits for tasks assessed                                        General Comments      Exercises Other Exercises Other Exercises: provided HEP with Tb and standing exercises and IS and flutter.x10 reps   Assessment/Plan    PT Assessment Patent does not need any further PT services  PT Problem List         PT Treatment Interventions  PT Goals (Current goals can be found in the Care Plan section)  Acute Rehab PT Goals Patient Stated Goal: get up the steps. PT Goal Formulation: All assessment and education complete, DC therapy    Frequency     Barriers to discharge        Co-evaluation               AM-PAC PT "6 Clicks" Mobility  Outcome Measure Help needed turning from your back to your side while in a flat bed without using bedrails?: None Help needed moving from lying on your back to sitting on the side of a flat bed without using bedrails?: None Help needed moving to and from a bed to a chair (including a wheelchair)?: None Help needed standing up from a chair using your arms (e.g., wheelchair or bedside chair)?: None Help needed to walk in hospital room?: None Help needed climbing 3-5 steps with a railing? : A Little 6 Click Score: 23    End of Session   Activity Tolerance: Patient tolerated treatment well Patient left: in chair Nurse Communication:  Mobility status(desats on steps) PT Visit Diagnosis: Difficulty in walking, not elsewhere classified (R26.2)    Time: 1287-8676 PT Time Calculation (min) (ACUTE ONLY): 31 min   Charges:   PT Evaluation $PT Eval Low Complexity: 1 Low PT Treatments $Gait Training: 8-22 mins        Tresa Endo PT Acute Rehabilitation Services Pager [phone] Office [phone]  Claretha Cooper 08/30/2019, 3:50 PM

## 2019-08-30 NOTE — Discharge Summary (Signed)
Physician Discharge Summary  Lyndon Chapel JAS:[id number] DOB: July 11, 1966 DOA: 08/25/2019  PCP: [name], MD  Admit date: 08/25/2019 Discharge date: 08/30/2019  Time spent: 45 minutes  Recommendations for Outpatient Follow-up:  1. May require discussion of increasing Metformin in the outpatient setting secondary to Decadron use 2. Recommend Chem-7, CBC 1 week 3. Diagnosed with DVT given Eliquis prescription suggest completion of the same in 3 months total 4. Work note given  Discharge Diagnoses:  Principal Problem:   Pneumonia due to COVID-19 virus Active Problems:   Diabetes mellitus type 2, uncontrolled, with complications (HCC)   Hypertension, essential   Dyslipidemia associated with type 2 diabetes mellitus (HCC)   Obesity, morbid, BMI 40.0-49.9 (HCC)   Diabetic neuropathy (HCC)   Respiratory failure, acute (HCC)   Discharge Condition: Improved  Diet recommendation: Diabetic  Filed Weights   08/25/19 1128 08/25/19 2246  Weight: 135.2 kg 135.2 kg    History of present illness:  89 black male BMI >40, chronic prostatitis, DM TY 2 on oral medications, HTN, reflux-admitted on 08/24/2018 1S OB, CP, N, V, abdominal pain 5 days prior to admission CXR = multifocal PNA Found to have bilaterl LE dvt's  Hospital Course:  Coronavirus pneumonia Remdesivir, dexamethasone started 2/8 with complete on 2/12--patient was also given a steroid taper of Decadron 6 mg for 6 more days Transition from ceftriaxone/azithromycin IV-->azithromycin p.o. and finish the course in the hospital I personally saw him ambulating the hallways on 2/13 he maintained his sats 91% and did not desaturate and I felt he was stable for discharge home He will be given a work note Acute bilateral lower extremity DVt's              Started on Lovenox-transition to Eliquis DVT present--will need ~ 3 mo of DVt rx transition to po liquids in am BMI >40 Needs dedicated weight loss counseling etc. DM TY 2 CBG  151-200 was on Lantus up to 28 units Continuing Metformin 500 twice daily today in addition to Jardiance Resuming glipizide on discharge expect sugars will be high in the 2-300 range however will come down once he completes his steroids Reflux Continue pantoprazole 20 daily Fungal intertrigo Hold meds for now Erectile dysfunction Hold meds for now   Discharge Exam: Vitals:   08/30/19 0359 08/30/19 0755  BP: 126/89 (!) 145/98  Pulse: 88 88  Resp: (!) 27 (!) 28  Temp: 99.1 F (37.3 C) 98.1 F (36.7 C)  SpO2: 93% 94%    General: Awake alert coherent no distress EOMI NCAT Cardiovascular: S1-S2 no murmur rub or gallop Chest clear no added sound no rales no rhonchi Abdomen soft no rebound no guarding no tenderness Mild lower extremity edema ROM intact  Discharge Instructions   Discharge Instructions    Diet - low sodium heart healthy   Complete by: As directed    Discharge instructions   Complete by: As directed    Recommend outpatient management of your diabetes if your sugars are uncontrolled once you finish the steroids-we have prescribed for you another 5 days of Decadron by mouth which is a steroid which can bump up your blood sugar-I know you were using insulin here but I do not suspect you will need insulin beyond the use of your steroids so monitoring of her sugars are above 300 consistently talk to her primary care physician and I would double up on your Metformin from 500 twice a day to 1000 twice a day You also were diagnosed with a  DVT and you will need to get a blood thinner for at least 3 months as we discussed and this was secondary to the inflammation caused by the Covid-remember that there are 2 separate doses higher dose to take for 7 days and then you cut back to a lower dose for the remainder of time you do not probably need a blood thinner beyond this As promised we will write a work note for you and ensure that you have excuse for work Do not use nonsteroidal  medications Seek medical attention if you get further shortness of breath Suggest you follow-up with your primary care physician without 2 weeks when you are outside of your quarantine.  And get lab testing   Increase activity slowly   Complete by: As directed      Allergies as of 08/30/2019      Reactions   Flexeril [cyclobenzaprine]    Causes Depression & Suicidal Thoughts      Medication List    STOP taking these medications   naproxen 500 MG tablet Commonly known as: NAPROSYN     TAKE these medications   apixaban 5 MG Tabs tablet Commonly known as: ELIQUIS Take 2 tablets (10 mg total) by mouth 2 (two) times daily for 14 doses.   apixaban 5 MG Tabs tablet Commonly known as: ELIQUIS Take 1 tablet (5 mg total) by mouth 2 (two) times daily. Start taking on: September 13, 2019   clotrimazole 1 % cream Commonly known as: LOTRIMIN APPLY TO AFFECTED AREA TWICE A DAY What changed: See the new instructions.   dexamethasone 6 MG tablet Commonly known as: DECADRON Take 1 tablet (6 mg total) by mouth daily for 6 days.   glipiZIDE 10 MG tablet Commonly known as: GLUCOTROL Take 1 tablet (10 mg total) by mouth 2 (two) times daily before lunch and supper.   glucose blood test strip Commonly known as: ONE TOUCH ULTRA TEST Check blood sugar twice a day and as instructed. Dx 250.00   Jardiance 25 MG Tabs tablet Generic drug: empagliflozin TAKE 1 TABLET BY MOUTH EVERY DAY What changed:   how much to take  when to take this   metFORMIN 500 MG tablet Commonly known as: GLUCOPHAGE Take 1 tablet (500 mg total) by mouth 2 (two) times daily with a meal.   pantoprazole 20 MG tablet Commonly known as: PROTONIX Take 1 tablet (20 mg total) by mouth daily.   sildenafil 100 MG tablet Commonly known as: VIAGRA TAKE 0.5-1 TABLETS (50-100 MG TOTAL) BY MOUTH DAILY AS NEEDED FOR ERECTILE DYSFUNCTION.            Durable Medical Equipment  (From admission, onward)          Start     Ordered   08/29/19 1439  DME Oxygen  (Discharge Planning)  Once    Question Answer Comment  Length of Need 6 Months   Mode or (Route) Nasal cannula   Liters per Minute 3   Frequency Continuous (stationary and portable oxygen unit needed)   Oxygen conserving device Yes   Oxygen delivery system Gas      08/29/19 1439         Allergies  Allergen Reactions  . Flexeril [Cyclobenzaprine]     Causes Depression & Suicidal Thoughts      The results of significant diagnostics from this hospitalization (including imaging, microbiology, ancillary and laboratory) are listed below for reference.    Significant Diagnostic Studies: DG Chest 2 View  Result  Date: 08/25/2019 CLINICAL DATA:  Cough, shortness of breath and fever for 2 days. EXAM: CHEST - 2 VIEW COMPARISON:  08/22/2019 FINDINGS: The cardiac silhouette, mediastinal and hilar contours are stable. Low lung volumes with vascular crowding and atelectasis. Multifocal patchy infiltrates are noted, possible COVID pneumonia. Recommend clinical correlation. No pleural effusions. The bony thorax is intact. IMPRESSION: Patchy multifocal pneumonia. Electronically Signed   By: Rudie MeyerP.  Gallerani M.D.   On: 08/25/2019 12:27   DG Chest 2 View  Result Date: 08/22/2019 CLINICAL DATA:  Left upper quadrant pain EXAM: CHEST - 2 VIEW COMPARISON:  August 08, 2016 FINDINGS: The heart size and mediastinal contours are within normal limits. Both lungs are clear. The visualized skeletal structures are unremarkable. IMPRESSION: No active cardiopulmonary disease. Electronically Signed   By: Jonna ClarkBindu  Avutu M.D.   On: 08/22/2019 22:46   CT ANGIO CHEST PE W OR WO CONTRAST  Result Date: 08/29/2019 CLINICAL DATA:  Respiratory difficulty, possible pulmonary embolism EXAM: CT ANGIOGRAPHY CHEST WITH CONTRAST TECHNIQUE: Multidetector CT imaging of the chest was performed using the standard protocol during bolus administration of intravenous contrast. Multiplanar CT image  reconstructions and MIPs were obtained to evaluate the vascular anatomy. CONTRAST:  75mL OMNIPAQUE IOHEXOL 350 MG/ML SOLN COMPARISON:  08/27/2019 FINDINGS: Cardiovascular: Thoracic aorta and its branches are within normal limits. Pulmonary artery shows a normal branching pattern. No filling defects are identified to suggest pulmonary embolism. No significant cardiomegaly is noted. No coronary calcifications are seen. Mediastinum/Nodes: Thoracic inlet is well visualized and within normal limits. No sizable hilar or mediastinal adenopathy is noted. The esophagus as visualized is within normal limits. Lungs/Pleura: Lungs are well aerated bilaterally but demonstrate diffuse bilateral ground-glass opacities consistent with the given clinical history of COVID-19 positivity. No sizable effusion is seen. No pneumothorax is noted. Upper Abdomen: Visualized upper abdomen is unremarkable. Musculoskeletal: Mild degenerative changes of the thoracic spine are noted. No acute rib abnormality is seen. Review of the MIP images confirms the above findings. IMPRESSION: Diffuse bilateral ground-glass opacities consistent with the given clinical history of COVID-19 pneumonia. No evidence of pulmonary emboli. Electronically Signed   By: Alcide CleverMark  Lukens M.D.   On: 08/29/2019 13:41   DG CHEST PORT 1 VIEW  Result Date: 08/27/2019 CLINICAL DATA:  Pneumonia EXAM: PORTABLE CHEST 1 VIEW COMPARISON:  08/25/2019 FINDINGS: Low lung volumes. Stable cardiac silhouette. There is patchy bilateral airspace disease which is increased in density compared to prior. No pneumothorax. IMPRESSION: Bilateral patchy airspace disease mildly increased in density consistent with multifocal COVID pneumonia. Electronically Signed   By: Genevive BiStewart  Edmunds M.D.   On: 08/27/2019 10:23   VAS US LOWER EXTREMITY VENOUS (DVT)  Result Date: 08/29/2019  Lower Venous DVTStudy Indications: Swelling.  Risk Factors: COVID 19 positive. Comparison Study: No prior studies.  Performing Technologist: Marilynne Halstedita Sturdivant RDMS, RVT  Examination Guidelines: A complete evaluation includes B-mode imaging, spectral Doppler, color Doppler, and power Doppler as needed of all accessible portions of each vessel. Bilateral testing is considered an integral part of a complete examination. Limited examinations for reoccurring indications may be performed as noted. The reflux portion of the exam is performed with the patient in reverse Trendelenburg.  +---------+---------------+---------+-----------+----------+--------------+ RIGHT    CompressibilityPhasicitySpontaneityPropertiesThrombus Aging +---------+---------------+---------+-----------+----------+--------------+ CFV      Full           Yes      Yes                                 +---------+---------------+---------+-----------+----------+--------------+  SFJ      Full                                                        +---------+---------------+---------+-----------+----------+--------------+ FV Prox  Full                                                        +---------+---------------+---------+-----------+----------+--------------+ FV Mid   Full                                                        +---------+---------------+---------+-----------+----------+--------------+ FV DistalFull                                                        +---------+---------------+---------+-----------+----------+--------------+ PFV      Full                                                        +---------+---------------+---------+-----------+----------+--------------+ POP      Full           Yes      Yes                                 +---------+---------------+---------+-----------+----------+--------------+ PTV      None                                         Acute          +---------+---------------+---------+-----------+----------+--------------+ PERO     Full                                                         +---------+---------------+---------+-----------+----------+--------------+ Gastroc  None                                         Acute          +---------+---------------+---------+-----------+----------+--------------+   +---------+---------------+---------+-----------+----------+--------------+ LEFT     CompressibilityPhasicitySpontaneityPropertiesThrombus Aging +---------+---------------+---------+-----------+----------+--------------+ CFV      Full           Yes      Yes                                 +---------+---------------+---------+-----------+----------+--------------+  SFJ      Full                                                        +---------+---------------+---------+-----------+----------+--------------+ FV Prox  Full                                                        +---------+---------------+---------+-----------+----------+--------------+ FV Mid   Full                                                        +---------+---------------+---------+-----------+----------+--------------+ FV DistalFull                                                        +---------+---------------+---------+-----------+----------+--------------+ PFV      Full                                                        +---------+---------------+---------+-----------+----------+--------------+ POP      Full           Yes      Yes                                 +---------+---------------+---------+-----------+----------+--------------+ PTV      Full                                                        +---------+---------------+---------+-----------+----------+--------------+ PERO     Full                                                        +---------+---------------+---------+-----------+----------+--------------+     Summary: RIGHT: - Findings consistent with acute deep vein thrombosis involving  the right posterior tibial veins, and right gastrocnemius veins. - No cystic structure found in the popliteal fossa.  LEFT: - There is no evidence of deep vein thrombosis in the lower extremity.  - No cystic structure found in the popliteal fossa.  *See table(s) above for measurements and observations. Electronically signed by Sherald Hess MD on 08/29/2019 at 6:13:37 PM.    Final     Microbiology: Recent Results (from the past 240 hour(s))  Blood Culture (routine x 2)     Status: None (Preliminary result)   Collection Time: 08/25/19  3:19 PM   Specimen: Site Not Specified; Blood  Result Value Ref Range Status   Specimen Description   Final    SITE NOT SPECIFIED Performed at [facility], [address] [place], [place] [address]    Special Requests   Final    BOTTLES DRAWN AEROBIC ONLY Blood Culture results may not be optimal due to an inadequate volume of blood received in culture bottles Performed at [facility], [address], [place], [place] [address]    Culture   Final    NO GROWTH 4 DAYS Performed at [facility], [address] [place], [place] [address]    Report Status PENDING  Incomplete  Blood Culture (routine x 2)     Status: None (Preliminary result)   Collection Time: 08/25/19  3:19 PM   Specimen: Site Not Specified; Blood  Result Value Ref Range Status   Specimen Description   Final    SITE NOT SPECIFIED Performed at [facility], [address], [place], [place] [address]    Special Requests   Final    BOTTLES DRAWN AEROBIC AND ANAEROBIC Blood Culture adequate volume Performed at [facility], [name] [place], [place] [address]    Culture   Final    NO GROWTH 4 DAYS Performed at [facility], [address] [place], [place] [address]    Report Status PENDING  Incomplete     Labs: Basic Metabolic Panel: Recent Labs  Lab 08/26/19 0329 08/27/19 0300  08/28/19 0235 08/29/19 0107 08/30/19 0100  NA 139 141 142 142 140  K 4.5 4.4 4.3 4.1 4.8  CL 104 105 105 107 103  CO2 22 25 23 23 23   GLUCOSE 262* 183* 199* 193* 209*  BUN 15 30* 29* 31* 28*  CREATININE 1.19 1.26* 1.13 1.08 1.13  CALCIUM 8.3* 8.3* 8.4* 8.3* 8.9  MG 1.9 2.3 2.4  --   --    Liver Function Tests: Recent Labs  Lab 08/26/19 0329 08/27/19 0300 08/28/19 0235 08/29/19 0107 08/30/19 0100  AST 26 30 31  48* 32  ALT 24 24 30  38 32  ALKPHOS 61 50 48 59 68  BILITOT 0.8 0.3 0.3 0.5 1.8*  PROT 7.2 7.2 7.3 7.2 7.2  ALBUMIN 3.2* 3.2* 3.3* 3.3* 3.6   No results for input(s): LIPASE, AMYLASE in the last 168 hours. No results for input(s): AMMONIA in the last 168 hours. CBC: Recent Labs  Lab 08/26/19 0329 08/27/19 0300 08/28/19 0235 08/29/19 0107 08/30/19 0100  WBC 5.5 5.8 7.5 8.1 10.6*  NEUTROABS 4.8 4.5 5.7 6.3 7.7  HGB 15.3 14.4 14.8 15.3 16.7  HCT 46.6 44.4 45.5 47.3 50.8  MCV 92.3 92.3 93.2 92.9 93.4  PLT 190 244 306 258 276   Cardiac Enzymes: No results for input(s): CKTOTAL, CKMB, CKMBINDEX, TROPONINI in the last 168 hours. BNP: BNP (last 3 results) No results for input(s): BNP in the last 8760 hours.  ProBNP (last 3 results) No results for input(s): PROBNP in the last 8760 hours.  CBG: Recent Labs  Lab 08/29/19 0739 08/29/19 1151 08/29/19 1727 08/29/19 2009 08/30/19 0739  GLUCAP 208* 151* 225* 259* 142*       Signed:  Rhetta Mura MD   [facility] 08/30/2019, 9:30 AM

## 2019-08-30 NOTE — Progress Notes (Signed)
SATURATION QUALIFICATIONS: (This note is used to comply with regulatory documentation for home oxygen)  Patient Saturations on Room Air at Rest = 95%  Patient Saturations on Room Air while Ambulating = 90%  Patient Saturations on 3 Liters of oxygen while Ambulating = 96%  Please briefly explain why patient needs home oxygen:pt can function without using o2 to carry out daily needs

## 2019-08-30 NOTE — Plan of Care (Signed)
Send message to notify provider pt WBC slightly elevated on morning labs.

## 2019-08-30 NOTE — TOC Progression Note (Signed)
Transition of Care [facility]) - Progression Note    Patient Details  Name: Jonathon [name] MRN: [id number] Date of Birth: Oct 26, 1965  [facility]) CM/SW Contact  Huston Foley Jacklynn Ganong, RN Phone Number: [phone] (working remotely) 08/30/2019, 11:56 AM  Clinical Narrative:   Case was asked to call Patient's wife to discuss discharge. Patient doesn't need oxygen based on his saturations. Mrs. [place]isco also expressed concerns that their power is out and that he would not be warm. Case manager discussed how best to get patient in house and upstairs when needed. She also has asked for PT eval to see if he needs therapy at discharge. Case manager explained that if its determined he needs HHPT that order will be sent to the [place] and they will arrange it for the patient. She thanked CM for returning her call. TOC Team will continue to monitor.         Expected Discharge Plan and Services           Expected Discharge Date: 08/30/19                                     Social Determinants of Health (SDOH) Interventions    Readmission Risk Interventions No flowsheet data found.

## 2019-08-31 LAB — GLUCOSE, CAPILLARY
Glucose-Capillary: 182 mg/dL — ABNORMAL HIGH (ref 70–99)
Glucose-Capillary: 214 mg/dL — ABNORMAL HIGH (ref 70–99)
Glucose-Capillary: 397 mg/dL — ABNORMAL HIGH (ref 70–99)
Glucose-Capillary: 304 mg/dL — ABNORMAL HIGH (ref 70–99)

## 2019-08-31 LAB — D-DIMER, QUANTITATIVE: D-Dimer, Quant: 16.65 ug/mL-FEU — ABNORMAL HIGH (ref 0.00–0.50)

## 2019-08-31 LAB — CBC WITH DIFFERENTIAL/PLATELET
Abs Immature Granulocytes: 0.41 10*3/uL — ABNORMAL HIGH (ref 0.00–0.07)
Basophils Absolute: 0.1 10*3/uL (ref 0.0–0.1)
Basophils Relative: 1 %
Eosinophils Absolute: 0 10*3/uL (ref 0.0–0.5)
Eosinophils Relative: 0 %
HCT: 48.4 % (ref 39.0–52.0)
Hemoglobin: 16 g/dL (ref 13.0–17.0)
Immature Granulocytes: 5 %
Lymphocytes Relative: 19 %
Lymphs Abs: 1.7 10*3/uL (ref 0.7–4.0)
MCH: 30.1 pg (ref 26.0–34.0)
MCHC: 33.1 g/dL (ref 30.0–36.0)
MCV: 91.1 fL (ref 80.0–100.0)
Monocytes Absolute: 0.5 10*3/uL (ref 0.1–1.0)
Monocytes Relative: 6 %
Neutro Abs: 6.1 10*3/uL (ref 1.7–7.7)
Neutrophils Relative %: 69 %
Platelets: 244 10*3/uL (ref 150–400)
RBC: 5.31 MIL/uL (ref 4.22–5.81)
RDW: 13.7 % (ref 11.5–15.5)
WBC: 8.9 10*3/uL (ref 4.0–10.5)
nRBC: 0.3 % — ABNORMAL HIGH (ref 0.0–0.2)

## 2019-08-31 LAB — FERRITIN: Ferritin: 551 ng/mL — ABNORMAL HIGH (ref 24–336)

## 2019-08-31 LAB — COMPREHENSIVE METABOLIC PANEL
ALT: 33 U/L (ref 0–44)
AST: 23 U/L (ref 15–41)
Albumin: 3.1 g/dL — ABNORMAL LOW (ref 3.5–5.0)
Alkaline Phosphatase: 60 U/L (ref 38–126)
Anion gap: 10 (ref 5–15)
BUN: 27 mg/dL — ABNORMAL HIGH (ref 6–20)
CO2: 23 mmol/L (ref 22–32)
Calcium: 8.8 mg/dL — ABNORMAL LOW (ref 8.9–10.3)
Chloride: 104 mmol/L (ref 98–111)
Creatinine, Ser: 1.08 mg/dL (ref 0.61–1.24)
GFR calc Af Amer: >60 mL/min (ref >60)
GFR calc non Af Amer: >60 mL/min (ref >60)
Glucose, Bld: 266 mg/dL — ABNORMAL HIGH (ref 70–99)
Potassium: 4.7 mmol/L (ref 3.5–5.1)
Sodium: 137 mmol/L (ref 135–145)
Total Bilirubin: 1 mg/dL (ref 0.3–1.2)
Total Protein: 7 g/dL (ref 6.5–8.1)

## 2019-08-31 NOTE — TOC Transition Note (Signed)
Transition of Care [facility]) - CM/SW Discharge Note   Patient Details  Name: Jonathon [name] MRN: [id number] Date of Birth: Nov 28, 1965  [facility]) CM/SW Contact:  Durenda Guthrie, RN Phone Number: [phone] (working remotely) 08/31/2019, 8:51 AM   Clinical Narrative:   Case manager notified by MD that patient will need oxygen for home. Per Therapist note patient desats while ambulating stairs. Patient has 17 stairs to second floor at home. CM informed MD and nurse that patient is covered under [employer], and the orders for home oxygen will have to be sent to them to arrange. [facility] does not work on the weekend or Holidays, a [facility] Team member will follow to facilitate setup. Patient sees Dr. Wendi Snipes at the [facility] .          Patient Goals and CMS Choice        Discharge Placement                       Discharge Plan and Services                                     Social Determinants of Health (SDOH) Interventions     Readmission Risk Interventions No flowsheet data found.

## 2019-08-31 NOTE — Progress Notes (Addendum)
seens examined discussed POC with patient and wife Cannot reliably get oxygen per TOC until 2/16 As he needs this to navigate his home and has ~ 20 steops to climb, not a safe d/c until then  No charge for today  Pleas Koch, MD [facility] 11:20 AM

## 2019-08-31 NOTE — Discharge Instructions (Addendum)
Recommendations for Outpatient Follow-up:  1. Patient's Metformin increased to twice daily in setting of use of Decadron may need to come back down off of that dose in the next several days to weeks 2. Recommend Chem-7, CBC 1 week 3. Diagnosed with DVT this admission given Eliquis prescription suggest completion of the same in 3 months total    Information on my medicine - ELIQUIS (apixaban)  This medication education was reviewed with me or my healthcare representative as part of my discharge preparation.    Why was Eliquis prescribed for you? Eliquis was prescribed to treat blood clots that may have been found in the veins of your legs (deep vein thrombosis) or in your lungs (pulmonary embolism) and to reduce the risk of them occurring again.  What do You need to know about Eliquis ? The starting dose is 10 mg (two 5 mg tablets) taken TWICE daily for the FIRST SEVEN (7) DAYS, then on 09/06/2019 (Saturday)  the dose is reduced to ONE 5 mg tablet taken TWICE daily.  Eliquis may be taken with or without food.   Try to take the dose about the same time in the morning and in the evening. If you have difficulty swallowing the tablet whole please discuss with your pharmacist how to take the medication safely.  Take Eliquis exactly as prescribed and DO NOT stop taking Eliquis without talking to the doctor who prescribed the medication.  Stopping may increase your risk of developing a new blood clot.  Refill your prescription before you run out.  After discharge, you should have regular check-up appointments with your healthcare provider that is prescribing your Eliquis.    What do you do if you miss a dose? If a dose of ELIQUIS is not taken at the scheduled time, take it as soon as possible on the same day and twice-daily administration should be resumed. The dose should not be doubled to make up for a missed dose.  Important Safety Information A possible side effect of Eliquis is  bleeding. You should call your healthcare provider right away if you experience any of the following: ? Bleeding from an injury or your nose that does not stop. ? Unusual colored urine (red or dark brown) or unusual colored stools (red or black). ? Unusual bruising for unknown reasons. ? A serious fall or if you hit your head (even if there is no bleeding).  Some medicines may interact with Eliquis and might increase your risk of bleeding or clotting while on Eliquis. To help avoid this, consult your healthcare provider or pharmacist prior to using any new prescription or non-prescription medications, including herbals, vitamins, non-steroidal anti-inflammatory drugs (NSAIDs) and supplements.  This website has more information on Eliquis (apixaban): http://www.eliquis.com/eliquis/home    COVID-19: How to Protect Yourself and Others Know how it spreads  There is currently no vaccine to prevent coronavirus disease 2019 (COVID-19).  The best way to prevent illness is to avoid being exposed to this virus.  The virus is thought to spread mainly from person-to-person. ? Between people who are in close contact with one another (within about 6 feet). ? Through respiratory droplets produced when an infected person coughs, sneezes or talks. ? These droplets can land in the mouths or noses of people who are nearby or possibly be inhaled into the lungs. ? COVID-19 may be spread by people who are not showing symptoms. Everyone should Clean your hands often  Wash your hands often with soap and water for at  least 20 seconds especially after you have been in a public place, or after blowing your nose, coughing, or sneezing.  If soap and water are not readily available, use a hand sanitizer that contains at least 60% alcohol. Cover all surfaces of your hands and rub them together until they feel dry.  Avoid touching your eyes, nose, and mouth with unwashed hands. Avoid close contact  Limit  contact with others as much as possible.  Avoid close contact with people who are sick.  Put distance between yourself and other people. ? Remember that some people without symptoms may be able to spread virus. ? This is especially important for people who are at higher risk of getting very [employer].it Cover your mouth and nose with a mask when around others  You could spread COVID-19 to others even if you do not feel sick.  Everyone should wear a mask in public settings and when around people not living in their household, especially when social distancing is difficult to maintain. ? Masks should not be placed on young children under age 60, anyone who has trouble breathing, or is unconscious, incapacitated or otherwise unable to remove the mask without assistance.  The mask is meant to protect other people in case you are infected.  Do NOT use a facemask meant for a Research scientist (physical sciences).  Continue to keep about 6 feet between yourself and others. The mask is not a substitute for social distancing. Cover coughs and sneezes  Always cover your mouth and nose with a tissue when you cough or sneeze or use the inside of your elbow.  Throw used tissues in the trash.  Immediately wash your hands with soap and water for at least 20 seconds. If soap and water are not readily available, clean your hands with a hand sanitizer that contains at least 60% alcohol. Clean and disinfect  Clean AND disinfect frequently touched surfaces daily. This includes tables, doorknobs, light switches, countertops, handles, desks, phones, keyboards, toilets, faucets, and sinks. [employer]com  If surfaces are dirty, clean them: Use detergent or soap and water prior to disinfection.  Then, use a household disinfectant. You can see a list of EPA-registered household  disinfectants here. [employer] 03/19/2019 This information is not intended to replace advice given to you by your health care provider. Make sure you discuss any questions you have with your health care provider. Document Revised: 03/27/2019 Document Reviewed: 01/23/2019 Elsevier Patient Education  2020 [employer].

## 2019-09-01 LAB — COMPREHENSIVE METABOLIC PANEL WITH GFR
ALT: 43 U/L (ref 0–44)
AST: 31 U/L (ref 15–41)
Albumin: 3.2 g/dL — ABNORMAL LOW (ref 3.5–5.0)
Alkaline Phosphatase: 52 U/L (ref 38–126)
Anion gap: 11 (ref 5–15)
BUN: 28 mg/dL — ABNORMAL HIGH (ref 6–20)
CO2: 24 mmol/L (ref 22–32)
Calcium: 9.4 mg/dL (ref 8.9–10.3)
Chloride: 106 mmol/L (ref 98–111)
Creatinine, Ser: 0.96 mg/dL (ref 0.61–1.24)
GFR calc Af Amer: >60 mL/min
GFR calc non Af Amer: >60 mL/min
Glucose, Bld: 182 mg/dL — ABNORMAL HIGH (ref 70–99)
Potassium: 4.7 mmol/L (ref 3.5–5.1)
Sodium: 141 mmol/L (ref 135–145)
Total Bilirubin: 0.5 mg/dL (ref 0.3–1.2)
Total Protein: 6.9 g/dL (ref 6.5–8.1)

## 2019-09-01 LAB — GLUCOSE, CAPILLARY
Glucose-Capillary: 317 mg/dL — ABNORMAL HIGH (ref 70–99)
Glucose-Capillary: 215 mg/dL — ABNORMAL HIGH (ref 70–99)
Glucose-Capillary: 224 mg/dL — ABNORMAL HIGH (ref 70–99)
Glucose-Capillary: 382 mg/dL — ABNORMAL HIGH (ref 70–99)
Glucose-Capillary: 414 mg/dL — ABNORMAL HIGH (ref 70–99)

## 2019-09-01 LAB — CBC WITH DIFFERENTIAL/PLATELET
Abs Immature Granulocytes: 0.43 10*3/uL — ABNORMAL HIGH (ref 0.00–0.07)
Basophils Absolute: 0.1 10*3/uL (ref 0.0–0.1)
Basophils Relative: 1 %
Eosinophils Absolute: 0 10*3/uL (ref 0.0–0.5)
Eosinophils Relative: 0 %
HCT: 47.3 % (ref 39.0–52.0)
Hemoglobin: 15.7 g/dL (ref 13.0–17.0)
Immature Granulocytes: 5 %
Lymphocytes Relative: 14 %
Lymphs Abs: 1.4 10*3/uL (ref 0.7–4.0)
MCH: 29.9 pg (ref 26.0–34.0)
MCHC: 33.2 g/dL (ref 30.0–36.0)
MCV: 90.1 fL (ref 80.0–100.0)
Monocytes Absolute: 0.5 10*3/uL (ref 0.1–1.0)
Monocytes Relative: 5 %
Neutro Abs: 7.3 10*3/uL (ref 1.7–7.7)
Neutrophils Relative %: 75 %
Platelets: 225 10*3/uL (ref 150–400)
RBC: 5.25 MIL/uL (ref 4.22–5.81)
RDW: 13.7 % (ref 11.5–15.5)
WBC: 9.7 10*3/uL (ref 4.0–10.5)
nRBC: 0 % (ref 0.0–0.2)

## 2019-09-01 LAB — D-DIMER, QUANTITATIVE: D-Dimer, Quant: >20 ug/mL-FEU — ABNORMAL HIGH (ref 0.00–0.50)

## 2019-09-01 LAB — FERRITIN: Ferritin: 717 ng/mL — ABNORMAL HIGH (ref 24–336)

## 2019-09-01 LAB — C-REACTIVE PROTEIN: CRP: 3.5 mg/dL — ABNORMAL HIGH (ref <1.0)

## 2019-09-01 MED ORDER — DEXAMETHASONE 6 MG PO TABS
6.0000 mg | ORAL_TABLET | Freq: Every day | ORAL | Status: DC
  Administered 2019-09-01 – 2019-09-02 (×2): 6 mg via ORAL
  Filled 2019-09-01 (×2): qty 1

## 2019-09-01 NOTE — Plan of Care (Signed)
  Problem: Education: Goal: Knowledge of risk factors and measures for prevention of condition will improve Outcome: Progressing   Problem: Coping: Goal: Psychosocial and spiritual needs will be supported Outcome: Progressing   Problem: Respiratory: Goal: Will maintain a patent airway Outcome: Progressing Goal: Complications related to the disease process, condition or treatment will be avoided or minimized Outcome: Progressing   Problem: Education: Goal: Ability to describe self-care measures that may prevent or decrease complications (Diabetes Survival Skills Education) will improve Outcome: Progressing Goal: Individualized Educational Video(s) Outcome: Progressing   Problem: Coping: Goal: Ability to adjust to condition or change in health will improve Outcome: Progressing   Problem: Fluid Volume: Goal: Ability to maintain a balanced intake and output will improve Outcome: Progressing   Problem: Health Behavior/Discharge Planning: Goal: Ability to identify and utilize available resources and services will improve Outcome: Progressing Goal: Ability to manage health-related needs will improve Outcome: Progressing   Problem: Metabolic: Goal: Ability to maintain appropriate glucose levels will improve Outcome: Progressing   Problem: Nutritional: Goal: Maintenance of adequate nutrition will improve Outcome: Progressing Goal: Progress toward achieving an optimal weight will improve Outcome: Progressing   Problem: Skin Integrity: Goal: Risk for impaired skin integrity will decrease Outcome: Progressing   Problem: Tissue Perfusion: Goal: Adequacy of tissue perfusion will improve Outcome: Progressing   

## 2019-09-01 NOTE — Clinical Social Work Note (Signed)
Unable to set up oxygen today due to [place] being closed.

## 2019-09-01 NOTE — Progress Notes (Signed)
Inpatient Diabetes Program Recommendations  AACE/ADA: New Consensus Statement on Inpatient Glycemic Control (2015)  Target Ranges:  Prepandial:   less than 140 mg/dL      Peak postprandial:   less than 180 mg/dL (1-2 hours)      Critically ill patients:  140 - 180 mg/dL   Lab Results  Component Value Date   GLUCAP 224 (H) 09/01/2019   HGBA1C 8.2 (H) 08/25/2019    Review of Glycemic Control  Post-prandials elevated in 200-300s in past 24H.  Inpatient Diabetes Program Recommendations:     Consider increasing Novolog to 10 units tidwc, while on Decadron.  Will follow closely.  Thank you. Ailene Ards, RD, LDN, CDE Inpatient Diabetes Coordinator [phone]

## 2019-09-01 NOTE — Progress Notes (Signed)
Hospitalist progress note   Patient from home, Patient going home eventually-disposition-likely d/c when oxygen can be arranged by [facility] and [place]  Jonathon [name] [id number] DOB: [birth year] DOA: 08/25/2019  PCP: Jonathon Boyden, MD   Narrative:  36 black male BMI >40, chronic prostatitis, DM TY 2 on oral medications, HTN, reflux-admitted on 08/24/2018 1S OB, CP, N, V, abdominal pain 5 days prior to admission CXR = multifocal PNA Found to have bilaterl LE dvt's  Data Reviewed:  BUN/creatinine 17/1.2-->35/1.26-->28/0.96 LFTs normal  WBC 5.8-->7.5-->9.7 CT chest negative for PE 2/12 LE duplex + for DVT bilaterally 2/12  Assessment & Plan:  Coronavirus pneumonia Remdesivir, dexamethasone started 2/8 with complete on 2/12 Transition from ceftriaxone/azithromycin IV-->azithromycin p.o. Needs oxygen to be able to ambulate at hoem and navigate stairs--cannot d/c until safe plan Acute bilateral lower extremity DVt's Started on Lovenox--transitioned to Eliquis on d/c needs at least 3 mo BMI >40 Needs dedicated weight loss counseling etc. DM TY 2 Sugars 215--224--likely worseened form BID decdron--dose cut back today to 56 liters oralls Glipizide to be resumed on discharge Reflux Continue pantoprazole 20 daily Fungal intertrigo Hold meds for now Erectile dysfunction Hold meds for now  No family members present-patient has updated  Subjective:  No new issues eating drinking no focal deficit No sob ambulatory to some degree  Consultants:    Objective: Vitals:   08/31/19 2007 08/31/19 2318 09/01/19 0400 09/01/19 0704  BP:  (!) 141/98 (!) 157/99 135/81  Pulse: 98 93 85 97  Resp:    (!) 21  Temp:  98 F (36.7 C) 97.8 F (36.6 C) 97.8 F (36.6 C)  TempSrc:  Oral Oral Oral  SpO2:  100% 100% 93%  Weight:      Height:        Intake/Output Summary (Last 24 hours) at 09/01/2019 1342 Last data filed at 08/31/2019 1751 Gross per 24 hour  Intake 300 ml  Output --  Net 300 ml   Filed  Weights   08/25/19 1128 08/25/19 2246  Weight: 135.2 kg 135.2 kg    Examination:   No changes to exam  eomi ncat no focal deficit Thick neck ncat cta b abd obese nt nd no rebound no guard Mild le edema rom intact  Scheduled Meds: . albuterol  2 puff Inhalation Q6H  . apixaban  10 mg Oral BID   Followed by  . [START ON 09/06/2019] apixaban  5 mg Oral BID  . vitamin C  500 mg Oral Daily  . canagliflozin  100 mg Oral QAC breakfast  . dexamethasone  6 mg Oral Daily  . insulin aspart  0-20 Units Subcutaneous TID WC  . insulin aspart  0-5 Units Subcutaneous QHS  . insulin aspart  6 Units Subcutaneous TID WC  . insulin glargine  28 Units Subcutaneous QHS  . metFORMIN  500 mg Oral BID WC  . pantoprazole  20 mg Oral Daily  . zinc sulfate  220 mg Oral Daily   Continuous Infusions:    LOS: 7 days   Time spent: 33 Pleas Koch, MD [facility]  09/01/2019, 1:42 PM

## 2019-09-02 ENCOUNTER — Encounter: Payer: Self-pay | Admitting: Family Medicine

## 2019-09-02 LAB — CBC WITH DIFFERENTIAL/PLATELET
Abs Immature Granulocytes: 0.49 10*3/uL — ABNORMAL HIGH (ref 0.00–0.07)
Basophils Absolute: 0.1 10*3/uL (ref 0.0–0.1)
Basophils Relative: 1 %
Eosinophils Absolute: 0.2 10*3/uL (ref 0.0–0.5)
Eosinophils Relative: 2 %
HCT: 47.5 % (ref 39.0–52.0)
Hemoglobin: 15.9 g/dL (ref 13.0–17.0)
Immature Granulocytes: 5 %
Lymphocytes Relative: 20 %
Lymphs Abs: 2.1 10*3/uL (ref 0.7–4.0)
MCH: 30.2 pg (ref 26.0–34.0)
MCHC: 33.5 g/dL (ref 30.0–36.0)
MCV: 90.3 fL (ref 80.0–100.0)
Monocytes Absolute: 0.7 10*3/uL (ref 0.1–1.0)
Monocytes Relative: 7 %
Neutro Abs: 6.7 10*3/uL (ref 1.7–7.7)
Neutrophils Relative %: 65 %
Platelets: 217 10*3/uL (ref 150–400)
RBC: 5.26 MIL/uL (ref 4.22–5.81)
RDW: 14 % (ref 11.5–15.5)
WBC: 10.1 10*3/uL (ref 4.0–10.5)
nRBC: 0.2 % (ref 0.0–0.2)

## 2019-09-02 LAB — COMPREHENSIVE METABOLIC PANEL
ALT: 54 U/L — ABNORMAL HIGH (ref 0–44)
AST: 26 U/L (ref 15–41)
Albumin: 3.4 g/dL — ABNORMAL LOW (ref 3.5–5.0)
Alkaline Phosphatase: 49 U/L (ref 38–126)
Anion gap: 14 (ref 5–15)
BUN: 28 mg/dL — ABNORMAL HIGH (ref 6–20)
CO2: 26 mmol/L (ref 22–32)
Calcium: 9.7 mg/dL (ref 8.9–10.3)
Chloride: 102 mmol/L (ref 98–111)
Creatinine, Ser: 0.97 mg/dL (ref 0.61–1.24)
GFR calc Af Amer: >60 mL/min (ref >60)
GFR calc non Af Amer: >60 mL/min (ref >60)
Glucose, Bld: 128 mg/dL — ABNORMAL HIGH (ref 70–99)
Potassium: 4.2 mmol/L (ref 3.5–5.1)
Sodium: 142 mmol/L (ref 135–145)
Total Bilirubin: 0.8 mg/dL (ref 0.3–1.2)
Total Protein: 6.9 g/dL (ref 6.5–8.1)

## 2019-09-02 LAB — D-DIMER, QUANTITATIVE: D-Dimer, Quant: >20 ug/mL-FEU — ABNORMAL HIGH (ref 0.00–0.50)

## 2019-09-02 LAB — GLUCOSE, CAPILLARY
Glucose-Capillary: 122 mg/dL — ABNORMAL HIGH (ref 70–99)
Glucose-Capillary: 209 mg/dL — ABNORMAL HIGH (ref 70–99)

## 2019-09-02 LAB — FERRITIN: Ferritin: 727 ng/mL — ABNORMAL HIGH (ref 24–336)

## 2019-09-02 LAB — C-REACTIVE PROTEIN: CRP: 1.7 mg/dL — ABNORMAL HIGH (ref <1.0)

## 2019-09-02 MED ORDER — DEXAMETHASONE 6 MG PO TABS: 6.0000 mg | ORAL_TABLET | Freq: Every day | ORAL | 0 refills | Status: AC

## 2019-09-02 MED ORDER — METFORMIN HCL 500 MG PO TABS: 1000.0000 mg | ORAL_TABLET | Freq: Two times a day (BID) | ORAL | 3 refills | Status: DC

## 2019-09-02 NOTE — TOC Transition Note (Signed)
Transition of Care [facility]) - CM/SW Discharge Note   Patient Details  Name: Hakim Minniefield MRN: [id number] Date of Birth: 05-30-1966  [facility]) CM/SW Contact:  Baldemar Lenis, LCSW Phone Number: 09/02/2019, 12:31 PM   Clinical Narrative:   CSW coordinated to get patient's oxygen set up through the [place] now that they're open. CSW spoke with multiple people at the [place] to obtain the information needed to set up patient's home oxygen (patient follows at [facility]), coordinated with RN and PT to complete walking sat information per the [place] request, and sent all information requested to the [place] to be set up. CSW received information from the [place] that the oxygen may not be able to be delivered today, and CSW updated patient and spouse. Per patient and spouse, patient has stairs that he has to navigate to the bedroom but is able to remain downstairs if he needs to until the oxygen is delivered. Patient would still like to be discharged home today regardless. CSW notified RN after forms were sent to the [place] and patient's wife will be picking the patient up. No further needs at this time.          Patient Goals and CMS Choice        Discharge Placement                       Discharge Plan and Services                                     Social Determinants of Health (SDOH) Interventions     Readmission Risk Interventions No flowsheet data found.

## 2019-09-02 NOTE — Discharge Summary (Signed)
Physician Discharge Summary  Ralph DowdyOrin Pham WUJ:[id number] DOB: 09-28-1965 DOA: 08/25/2019  PCP: Eustaquio BoydenGutierrez, Javier, MD  Admit date: 08/25/2019 Discharge date: 09/02/2019  Time spent: 45 minutes  Recommendations for Outpatient Follow-up:  1. Patient's Metformin increased to twice daily in setting of use of Decadron may need to come back down off of that dose in the next several days to weeks 2. Recommend Chem-7, CBC 1 week 3. Diagnosed with DVT this admission given Eliquis prescription suggest completion of the same in 3 months total 4. Work note given  Discharge Diagnoses:  Principal Problem:   Pneumonia due to COVID-19 virus Active Problems:   Diabetes mellitus type 2, uncontrolled, with complications (HCC)   Hypertension, essential   Dyslipidemia associated with type 2 diabetes mellitus (HCC)   Obesity, morbid, BMI 40.0-49.9 (HCC)   Diabetic neuropathy (HCC)   Respiratory failure, acute (HCC)   Discharge Condition: Improved  Diet recommendation: Diabetic  Filed Weights   08/25/19 1128 08/25/19 2246  Weight: 135.2 kg 135.2 kg    History of present illness:  [id number] black male BMI >40, chronic prostatitis, DM TY 2 on oral medications, HTN, reflux-admitted on 08/24/2018 1S OB, CP, N, V, abdominal pain 5 days prior to admission CXR = multifocal PNA Found to have bilaterl LE dvt's  Hospital Course:  Coronavirus pneumonia Remdesivir, dexamethasone started 2/8 with complete on 2/12--patient was also given a steroid taper of Decadron 6 mg for 6 more days Transition from ceftriaxone/azithromycin IV-->azithromycin p.o. and finish the course in the hospital He desaturated during hospital stay while climbing stairs and required 5 minutes to recover with sats in the low 80s and has 23 stairs at home-oxygen could not be supplied to him until 2/16 [facility] and once this is done he can discharge home He will be given a work note Acute bilateral lower extremity  DVt's             Started on Lovenox-transition to Eliquis DVT present--will need ~ 3 mo of DVt rx transition to po liquids in am BMI >40 Needs dedicated weight loss counseling etc. DM TY 2 Sugars uncontrolled during hospital stay as high as 400 at times He did resume Metformin in the hospital 1000 twice daily and resumed on discharge his Jardiance and glipizide which should help lower his sugars Uncontrolled sugar was felt likely secondary to Decadron and this should resolve once he comes off of this medication Reflux Continue pantoprazole 20 daily Fungal intertrigo Hold meds for now Erectile dysfunction Hold meds for now   Discharge Exam: Vitals:   09/02/19 0325 09/02/19 0709  BP: (!) 139/92 116/76  Pulse: 90 96  Resp:  19  Temp: 97.6 F (36.4 C) 97.6 F (36.4 C)  SpO2: 92% 94%    Awake coherent sitting in chair no distress submandibular lymphadenopathy EOMI NCAT no focal deficit Abdomen obese nontender no rebound or guarding ROM intact lower to fall 5/5 neurologically intact  Discharge Instructions   Discharge Instructions    MyChart COVID-19 home monitoring program   Complete by: Aug 30, 2019    Is the patient willing to use the MyChart Mobile App for home monitoring?: Yes   Temperature monitoring   Complete by: Aug 30, 2019    After how many days would you like to receive a notification of this patient's flowsheet entries?: 1   Diet - low sodium heart healthy   Complete by: As directed    Discharge instructions  Complete by: As directed    Recommend outpatient management of your diabetes if your sugars are uncontrolled once you finish the steroids-we have prescribed for you another 5 days of Decadron by mouth which is a steroid which can bump up your blood sugar-I know you were using insulin here but I do not suspect you will need insulin beyond the use of your steroids so monitoring of her sugars are above 300 consistently talk to her primary care physician and  I would double up on your Metformin from 500 twice a day to 1000 twice a day You also were diagnosed with a DVT and you will need to get a blood thinner for at least 3 months as we discussed and this was secondary to the inflammation caused by the Covid-remember that there are 2 separate doses higher dose to take for 7 days and then you cut back to a lower dose for the remainder of time you do not probably need a blood thinner beyond this As promised we will write a work note for you and ensure that you have excuse for work Do not use nonsteroidal medications Seek medical attention if you get further shortness of breath Suggest you follow-up with your primary care physician without 2 weeks when you are outside of your quarantine.  And get lab testing   Increase activity slowly   Complete by: As directed      Allergies as of 09/02/2019      Reactions   Flexeril [cyclobenzaprine]    Causes Depression & Suicidal Thoughts      Medication List    STOP taking these medications   naproxen 500 MG tablet Commonly known as: NAPROSYN     TAKE these medications   apixaban 5 MG Tabs tablet Commonly known as: ELIQUIS Take 2 tablets (10 mg total) by mouth 2 (two) times daily for 14 doses.   apixaban 5 MG Tabs tablet Commonly known as: ELIQUIS Take 1 tablet (5 mg total) by mouth 2 (two) times daily. Start taking on: September 13, 2019   clotrimazole 1 % cream Commonly known as: LOTRIMIN APPLY TO AFFECTED AREA TWICE A DAY What changed: See the new instructions.   dexamethasone 6 MG tablet Commonly known as: DECADRON Take 1 tablet (6 mg total) by mouth daily for 3 days.   glipiZIDE 10 MG tablet Commonly known as: GLUCOTROL Take 1 tablet (10 mg total) by mouth 2 (two) times daily before lunch and supper.   glucose blood test strip Commonly known as: ONE TOUCH ULTRA TEST Check blood sugar twice a day and as instructed. Dx 250.00   Jardiance 25 MG Tabs tablet Generic drug:  empagliflozin TAKE 1 TABLET BY MOUTH EVERY DAY What changed:   how much to take  when to take this   metFORMIN 500 MG tablet Commonly known as: GLUCOPHAGE Take 2 tablets (1,000 mg total) by mouth 2 (two) times daily with a meal. What changed: how much to take   pantoprazole 20 MG tablet Commonly known as: PROTONIX Take 1 tablet (20 mg total) by mouth daily.   sildenafil 100 MG tablet Commonly known as: VIAGRA TAKE 0.5-1 TABLETS (50-100 MG TOTAL) BY MOUTH DAILY AS NEEDED FOR ERECTILE DYSFUNCTION.            Durable Medical Equipment  (From admission, onward)         Start     Ordered   08/30/19 1609  DME Oxygen  (Discharge Planning)  Once    Question  Answer Comment  Length of Need 6 Months   Mode or (Route) Nasal cannula   Liters per Minute 2   Frequency Continuous (stationary and portable oxygen unit needed)   Oxygen delivery system Gas      08/30/19 1608   08/29/19 1439  DME Oxygen  (Discharge Planning)  Once    Question Answer Comment  Length of Need 6 Months   Mode or (Route) Nasal cannula   Liters per Minute 3   Frequency Continuous (stationary and portable oxygen unit needed)   Oxygen conserving device Yes   Oxygen delivery system Gas      08/29/19 1439         Allergies  Allergen Reactions  . Flexeril [Cyclobenzaprine]     Causes Depression & Suicidal Thoughts      The results of significant diagnostics from this hospitalization (including imaging, microbiology, ancillary and laboratory) are listed below for reference.    Significant Diagnostic Studies: DG Chest 2 View  Result Date: 08/25/2019 CLINICAL DATA:  Cough, shortness of breath and fever for 2 days. EXAM: CHEST - 2 VIEW COMPARISON:  08/22/2019 FINDINGS: The cardiac silhouette, mediastinal and hilar contours are stable. Low lung volumes with vascular crowding and atelectasis. Multifocal patchy infiltrates are noted, possible COVID pneumonia. Recommend clinical correlation. No pleural  effusions. The bony thorax is intact. IMPRESSION: Patchy multifocal pneumonia. Electronically Signed   By: Rudie Meyer M.D.   On: 08/25/2019 12:27   DG Chest 2 View  Result Date: 08/22/2019 CLINICAL DATA:  Left upper quadrant pain EXAM: CHEST - 2 VIEW COMPARISON:  August 08, 2016 FINDINGS: The heart size and mediastinal contours are within normal limits. Both lungs are clear. The visualized skeletal structures are unremarkable. IMPRESSION: No active cardiopulmonary disease. Electronically Signed   By: Jonna Clark M.D.   On: 08/22/2019 22:46   CT ANGIO CHEST PE W OR WO CONTRAST  Result Date: 08/29/2019 CLINICAL DATA:  Respiratory difficulty, possible pulmonary embolism EXAM: CT ANGIOGRAPHY CHEST WITH CONTRAST TECHNIQUE: Multidetector CT imaging of the chest was performed using the standard protocol during bolus administration of intravenous contrast. Multiplanar CT image reconstructions and MIPs were obtained to evaluate the vascular anatomy. CONTRAST:  86mL OMNIPAQUE IOHEXOL 350 MG/ML SOLN COMPARISON:  08/27/2019 FINDINGS: Cardiovascular: Thoracic aorta and its branches are within normal limits. Pulmonary artery shows a normal branching pattern. No filling defects are identified to suggest pulmonary embolism. No significant cardiomegaly is noted. No coronary calcifications are seen. Mediastinum/Nodes: Thoracic inlet is well visualized and within normal limits. No sizable hilar or mediastinal adenopathy is noted. The esophagus as visualized is within normal limits. Lungs/Pleura: Lungs are well aerated bilaterally but demonstrate diffuse bilateral ground-glass opacities consistent with the given clinical history of COVID-19 positivity. No sizable effusion is seen. No pneumothorax is noted. Upper Abdomen: Visualized upper abdomen is unremarkable. Musculoskeletal: Mild degenerative changes of the thoracic spine are noted. No acute rib abnormality is seen. Review of the MIP images confirms the above findings.  IMPRESSION: Diffuse bilateral ground-glass opacities consistent with the given clinical history of COVID-19 pneumonia. No evidence of pulmonary emboli. Electronically Signed   By: Alcide Clever M.D.   On: 08/29/2019 13:41   DG CHEST PORT 1 VIEW  Result Date: 08/27/2019 CLINICAL DATA:  Pneumonia EXAM: PORTABLE CHEST 1 VIEW COMPARISON:  08/25/2019 FINDINGS: Low lung volumes. Stable cardiac silhouette. There is patchy bilateral airspace disease which is increased in density compared to prior. No pneumothorax. IMPRESSION: Bilateral patchy airspace disease mildly increased in  density consistent with multifocal COVID pneumonia. Electronically Signed   By: Genevive Bi M.D.   On: 08/27/2019 10:23   VAS [place] LOWER EXTREMITY VENOUS (DVT)  Result Date: 08/29/2019  Lower Venous DVTStudy Indications: Swelling.  Risk Factors: COVID 19 positive. Comparison Study: No prior studies. Performing Technologist: Marilynne Halsted RDMS, RVT  Examination Guidelines: A complete evaluation includes B-mode imaging, spectral Doppler, color Doppler, and power Doppler as needed of all accessible portions of each vessel. Bilateral testing is considered an integral part of a complete examination. Limited examinations for reoccurring indications may be performed as noted. The reflux portion of the exam is performed with the patient in reverse Trendelenburg.  +---------+---------------+---------+-----------+----------+--------------+ RIGHT    CompressibilityPhasicitySpontaneityPropertiesThrombus Aging +---------+---------------+---------+-----------+----------+--------------+ CFV      Full           Yes      Yes                                 +---------+---------------+---------+-----------+----------+--------------+ SFJ      Full                                                        +---------+---------------+---------+-----------+----------+--------------+ FV Prox  Full                                                         +---------+---------------+---------+-----------+----------+--------------+ FV Mid   Full                                                        +---------+---------------+---------+-----------+----------+--------------+ FV DistalFull                                                        +---------+---------------+---------+-----------+----------+--------------+ PFV      Full                                                        +---------+---------------+---------+-----------+----------+--------------+ POP      Full           Yes      Yes                                 +---------+---------------+---------+-----------+----------+--------------+ PTV      None                                         Acute          +---------+---------------+---------+-----------+----------+--------------+  PERO     Full                                                        +---------+---------------+---------+-----------+----------+--------------+ Gastroc  None                                         Acute          +---------+---------------+---------+-----------+----------+--------------+   +---------+---------------+---------+-----------+----------+--------------+ LEFT     CompressibilityPhasicitySpontaneityPropertiesThrombus Aging +---------+---------------+---------+-----------+----------+--------------+ CFV      Full           Yes      Yes                                 +---------+---------------+---------+-----------+----------+--------------+ SFJ      Full                                                        +---------+---------------+---------+-----------+----------+--------------+ FV Prox  Full                                                        +---------+---------------+---------+-----------+----------+--------------+ FV Mid   Full                                                         +---------+---------------+---------+-----------+----------+--------------+ FV DistalFull                                                        +---------+---------------+---------+-----------+----------+--------------+ PFV      Full                                                        +---------+---------------+---------+-----------+----------+--------------+ POP      Full           Yes      Yes                                 +---------+---------------+---------+-----------+----------+--------------+ PTV      Full                                                        +---------+---------------+---------+-----------+----------+--------------+  PERO     Full                                                        +---------+---------------+---------+-----------+----------+--------------+     Summary: RIGHT: - Findings consistent with acute deep vein thrombosis involving the right posterior tibial veins, and right gastrocnemius veins. - No cystic structure found in the popliteal fossa.  LEFT: - There is no evidence of deep vein thrombosis in the lower extremity.  - No cystic structure found in the popliteal fossa.  *See table(s) above for measurements and observations. Electronically signed by Monica Martinez MD on 08/29/2019 at 6:13:37 PM.    Final     Microbiology: Recent Results (from the past 240 hour(s))  Blood Culture (routine x 2)     Status: None   Collection Time: 08/25/19  3:19 PM   Specimen: Site Not Specified; Blood  Result Value Ref Range Status   Specimen Description   Final    SITE NOT SPECIFIED Performed at [facility] [address] [place], [place] [address]    Special Requests   Final    BOTTLES DRAWN AEROBIC ONLY Blood Culture results may not be optimal due to an inadequate volume of blood received in culture bottles Performed at [facility] [address], [place], [place] [address]    Culture    Final    NO GROWTH 5 DAYS Performed at [facility], [place] [address] [place], [place] [address]    Report Status 08/30/2019 FINAL  Final  Blood Culture (routine x 2)     Status: None   Collection Time: 08/25/19  3:19 PM   Specimen: Site Not Specified; Blood  Result Value Ref Range Status   Specimen Description   Final    SITE NOT SPECIFIED Performed at [facility] [address] [place], [place] [address]    Special Requests   Final    BOTTLES DRAWN AEROBIC AND ANAEROBIC Blood Culture adequate volume Performed at [facility] [address], [place], [place] [address]    Culture   Final    NO GROWTH 5 DAYS Performed at [facility], [place] [address] [place],  [address]    Report Status 08/30/2019 FINAL  Final     Labs: Basic Metabolic Panel: Recent Labs  Lab 08/27/19 0300 08/27/19 0300 08/28/19 0235 08/28/19 0235 08/29/19 0107 08/30/19 0100 08/31/19 0248 09/01/19 0400 09/02/19 0318  NA 141   < > 142   < > 142 140 137 141 142  K 4.4   < > 4.3   < > 4.1 4.8 4.7 4.7 4.2  CL 105   < > 105   < > 107 103 104 106 102  CO2 25   < > 23   < > 23 23 23 24 26   GLUCOSE 183*   < > 199*   < > 193* 209* 266* 182* 128*  BUN 30*   < > 29*   < > 31* 28* 27* 28* 28*  CREATININE 1.26*   < > 1.13   < > 1.08 1.13 1.08 0.96 0.97  CALCIUM 8.3*   < > 8.4*   < > 8.3* 8.9 8.8* 9.4 9.7  MG 2.3  --  2.4  --   --   --   --   --   --    < > =  values in this interval not displayed.   Liver Function Tests: Recent Labs  Lab 08/29/19 0107 08/30/19 0100 08/31/19 0248 09/01/19 0400 09/02/19 0318  AST 48* 32 23 31 26   ALT 38 32 33 43 54*  ALKPHOS 59 68 60 52 49  BILITOT 0.5 1.8* 1.0 0.5 0.8  PROT 7.2 7.2 7.0 6.9 6.9  ALBUMIN 3.3* 3.6 3.1* 3.2* 3.4*   No results for input(s): LIPASE, AMYLASE in the last 168 hours. No results for input(s): AMMONIA in the last 168 hours. CBC: Recent Labs  Lab 08/29/19 0107 08/30/19 0100  08/31/19 0955 09/01/19 0400 09/02/19 0318  WBC 8.1 10.6* 8.9 9.7 10.1  NEUTROABS 6.3 7.7 6.1 7.3 6.7  HGB 15.3 16.7 16.0 15.7 15.9  HCT 47.3 50.8 48.4 47.3 47.5  MCV 92.9 93.4 91.1 90.1 90.3  PLT 258 276 244 225 217   Cardiac Enzymes: No results for input(s): CKTOTAL, CKMB, CKMBINDEX, TROPONINI in the last 168 hours. BNP: BNP (last 3 results) No results for input(s): BNP in the last 8760 hours.  ProBNP (last 3 results) No results for input(s): PROBNP in the last 8760 hours.  CBG: Recent Labs  Lab 09/01/19 0702 09/01/19 1130 09/01/19 1624 09/01/19 2000 09/02/19 0711  GLUCAP 215* 224* 382* 414* 122*       Signed:  Rhetta MuraJai-Gurmukh Sunil Hue MD   [facility] 09/02/2019, 7:57 AM

## 2019-09-02 NOTE — Plan of Care (Signed)
Pt discharged  Problem: Education: Goal: Knowledge of risk factors and measures for prevention of condition will improve 09/02/2019 0909 by Garrison Columbus, RN Outcome: Adequate for Discharge 09/02/2019 0743 by Garrison Columbus, RN Outcome: Progressing   Problem: Coping: Goal: Psychosocial and spiritual needs will be supported 09/02/2019 0909 by Garrison Columbus, RN Outcome: Adequate for Discharge 09/02/2019 0743 by Garrison Columbus, RN Outcome: Progressing   Problem: Respiratory: Goal: Will maintain a patent airway 09/02/2019 0909 by Garrison Columbus, RN Outcome: Adequate for Discharge 09/02/2019 0743 by Garrison Columbus, RN Outcome: Progressing Goal: Complications related to the disease process, condition or treatment will be avoided or minimized 09/02/2019 0909 by Garrison Columbus, RN Outcome: Adequate for Discharge 09/02/2019 0743 by Garrison Columbus, RN Outcome: Progressing   Problem: Education: Goal: Ability to describe self-care measures that may prevent or decrease complications (Diabetes Survival Skills Education) will improve 09/02/2019 0909 by Garrison Columbus, RN Outcome: Adequate for Discharge 09/02/2019 0743 by Garrison Columbus, RN Outcome: Progressing Goal: Individualized Educational Video(s) 09/02/2019 0909 by Garrison Columbus, RN Outcome: Adequate for Discharge 09/02/2019 0743 by Garrison Columbus, RN Outcome: Progressing   Problem: Coping: Goal: Ability to adjust to condition or change in health will improve 09/02/2019 0909 by Garrison Columbus, RN Outcome: Adequate for Discharge 09/02/2019 0743 by Garrison Columbus, RN Outcome: Progressing   Problem: Fluid Volume: Goal: Ability to maintain a balanced intake and output will improve 09/02/2019 0909 by Garrison Columbus, RN Outcome: Adequate for Discharge 09/02/2019 0743 by Garrison Columbus, RN Outcome: Progressing   Problem: Health Behavior/Discharge Planning: Goal: Ability to identify and utilize available resources and services will  improve 09/02/2019 0909 by Garrison Columbus, RN Outcome: Adequate for Discharge 09/02/2019 0743 by Garrison Columbus, RN Outcome: Progressing Goal: Ability to manage health-related needs will improve 09/02/2019 0909 by Garrison Columbus, RN Outcome: Adequate for Discharge 09/02/2019 0743 by Garrison Columbus, RN Outcome: Progressing   Problem: Metabolic: Goal: Ability to maintain appropriate glucose levels will improve 09/02/2019 0909 by Garrison Columbus, RN Outcome: Adequate for Discharge 09/02/2019 0743 by Garrison Columbus, RN Outcome: Progressing   Problem: Nutritional: Goal: Maintenance of adequate nutrition will improve 09/02/2019 0909 by Garrison Columbus, RN Outcome: Adequate for Discharge 09/02/2019 0743 by Garrison Columbus, RN Outcome: Progressing Goal: Progress toward achieving an optimal weight will improve 09/02/2019 0909 by Garrison Columbus, RN Outcome: Adequate for Discharge 09/02/2019 0743 by Garrison Columbus, RN Outcome: Progressing   Problem: Skin Integrity: Goal: Risk for impaired skin integrity will decrease 09/02/2019 0909 by Garrison Columbus, RN Outcome: Adequate for Discharge 09/02/2019 0743 by Garrison Columbus, RN Outcome: Progressing   Problem: Tissue Perfusion: Goal: Adequacy of tissue perfusion will improve 09/02/2019 0909 by Garrison Columbus, RN Outcome: Adequate for Discharge 09/02/2019 0743 by Garrison Columbus, RN Outcome: Progressing

## 2019-09-02 NOTE — Plan of Care (Signed)
  Problem: Education: Goal: Knowledge of risk factors and measures for prevention of condition will improve Outcome: Progressing   Problem: Coping: Goal: Psychosocial and spiritual needs will be supported Outcome: Progressing   Problem: Respiratory: Goal: Will maintain a patent airway Outcome: Progressing Goal: Complications related to the disease process, condition or treatment will be avoided or minimized Outcome: Progressing   Problem: Education: Goal: Ability to describe self-care measures that may prevent or decrease complications (Diabetes Survival Skills Education) will improve Outcome: Progressing Goal: Individualized Educational Video(s) Outcome: Progressing   Problem: Coping: Goal: Ability to adjust to condition or change in health will improve Outcome: Progressing   Problem: Fluid Volume: Goal: Ability to maintain a balanced intake and output will improve Outcome: Progressing   Problem: Health Behavior/Discharge Planning: Goal: Ability to identify and utilize available resources and services will improve Outcome: Progressing Goal: Ability to manage health-related needs will improve Outcome: Progressing   Problem: Metabolic: Goal: Ability to maintain appropriate glucose levels will improve Outcome: Progressing   Problem: Nutritional: Goal: Maintenance of adequate nutrition will improve Outcome: Progressing Goal: Progress toward achieving an optimal weight will improve Outcome: Progressing   Problem: Skin Integrity: Goal: Risk for impaired skin integrity will decrease Outcome: Progressing   Problem: Tissue Perfusion: Goal: Adequacy of tissue perfusion will improve Outcome: Progressing   

## 2019-09-02 NOTE — Progress Notes (Signed)
Physical Therapy Treatment Patient Details Name: Jonathon [name] MRN: [id number] DOB: 09-06-1965 Today's Date: 09/02/2019    History of Present Illness 83 black male BMI >40, chronic prostatitis, DM TY 2 on oral medications, HTN, reflux-admitted on 08/24/2018 1S OB, CP, N, V, abdominal pain 5 days prior to admission,CXR = multifocal PNA, RLE DVT.    PT Comments    Acknowledge orders to repeat "step test" to be within 24 hours of discharge. RN provided form for Insurance re oxygen. He maintained well above 90 at rest. During actual test of "23 steps" like in his home, he actually briefly maintained 98-100%, and throughout the test above 96%. At the end of the test, he desatted briefly to 86%, but recovered with pursed lip breathing in < 3 minutes- 94%+. He indicated that he had hoped to get a oxygen tank for home"just in case". This was a one time visit and all information was provided on form.   Follow Up Recommendations  No PT follow up     Equipment Recommendations  None recommended by PT    Recommendations for Other Services       Precautions / Restrictions Precautions Precautions: None Precaution Comments: Monitored HR, RR, and SPO2 for a repeat step test- Insurance required to be within 24 hours of discharge Restrictions Weight Bearing Restrictions: No    Mobility  Bed Mobility Overal bed mobility: Independent                Transfers Overall transfer level: Independent                  Ambulation/Gait Ambulation/Gait assistance: Independent     Gait Pattern/deviations: WFL(Within Functional Limits)         Stairs Stairs: Yes Stairs assistance: Modified independent (Device/Increase time)(23 for test related to O2 needs) Stair Management: One rail Left Number of Stairs: 23 General stair comments: This session was at the request of insurance for consideration of oxygen needs at home. Test was conducted on room air   Wheelchair Mobility    Modified  Rankin (Stroke Patients Only)       Balance                                            Cognition Arousal/Alertness: Awake/alert Behavior During Therapy: WFL for tasks assessed/performed Overall Cognitive Status: Within Functional Limits for tasks assessed                                 General Comments: One time visit for "step test" to be in compliance with Insurance request to be within 24 hours of discharge.      Exercises Other Exercises Other Exercises: Pursed lip breathing throughout step test and reminded to continue breathing ex at home    General Comments General comments (skin integrity, edema, etc.): Monitored for repeat steps test to determine any needs for O2 at home. He maintained 95-99% on RA at rest- during test he maintained from 95-100%, but at end desatted briefly into the 80's- quick recovery < 3 minutes with pursed lip breathing      Pertinent Vitals/Pain Pain Assessment: No/denies pain    Home Living                      Prior Function  PT Goals (current goals can now be found in the care plan section) Acute Rehab PT Goals Patient Stated Goal: get up the steps. PT Goal Formulation: All assessment and education complete, DC therapy Time For Goal Achievement: 09/02/19 Potential to Achieve Goals: Good Progress towards PT goals: Goals met/education completed, patient discharged from PT    Frequency    (this was one time visit to repeat the step test.)      PT Plan      Co-evaluation              AM-PAC PT "6 Clicks" Mobility   Outcome Measure  Help needed turning from your back to your side while in a flat bed without using bedrails?: None Help needed moving from lying on your back to sitting on the side of a flat bed without using bedrails?: None Help needed moving to and from a bed to a chair (including a wheelchair)?: None Help needed standing up from a chair using your arms  (e.g., wheelchair or bedside chair)?: None Help needed to walk in hospital room?: None Help needed climbing 3-5 steps with a railing? : None 6 Click Score: 24    End of Session   Activity Tolerance: Patient tolerated treatment well Patient left: in chair;with call bell/phone within reach Nurse Communication: Mobility status(Reported findings to RN and form was completed.)       Time: 1000-1017 PT Time Calculation (min) (ACUTE ONLY): 17 min  Charges:  $Therapeutic Exercise: 8-22 mins(One time session for repeat "step test")                    Rollen Sox, PT # [phone] CGV cell  Casandra Doffing 09/02/2019, 12:44 PM

## 2019-09-03 ENCOUNTER — Telehealth: Payer: Self-pay

## 2019-09-03 NOTE — Telephone Encounter (Signed)
Transition Care Management Follow-up Telephone Call  Date of discharge and from where: 09/02/2019[facility]  How have you been since you were released from the hospital? Patient states that he is feeling much better. No symptoms at this time.   Any questions or concerns? No   Items Reviewed:  Did the pt receive and understand the discharge instructions provided? Yes   Medications obtained and verified? Yes   Any new allergies since your discharge? No   Dietary orders reviewed? Yes  Do you have support at home? Yes   Functional Questionnaire: (I = Independent and D = Dependent) ADLs: I  Bathing/Dressing- I  Meal Prep- I  Eating- I  Maintaining continence- I  Transferring/Ambulation- I  Managing Meds- I  Follow up appointments reviewed:   PCP Hospital f/u appt confirmed? Yes  Scheduled to see Dr. Sharen Hones on 09/05/2019 @ 9:30 am virtual.  Specialist Hospital f/u appt confirmed? N/A   Are transportation arrangements needed? No   If their condition worsens, is the pt aware to call PCP or go to the Emergency Dept.? Yes  Was the patient provided with contact information for the PCP's office or ED? Yes  Was to pt encouraged to call back with questions or concerns? Yes

## 2019-09-04 ENCOUNTER — Telehealth: Payer: Self-pay

## 2019-09-04 ENCOUNTER — Other Ambulatory Visit: Payer: Self-pay | Admitting: Internal Medicine

## 2019-09-04 MED ORDER — ALBUTEROL SULFATE HFA 108 (90 BASE) MCG/ACT IN AERS: 2.0000 | INHALATION_SPRAY | Freq: Four times a day (QID) | RESPIRATORY_TRACT | 0 refills | Status: DC | PRN

## 2019-09-04 NOTE — Telephone Encounter (Signed)
[facility] Night - Client TELEPHONE ADVICE RECORD [facility] Patient Name: Jonathon [name] Gender: Male DOB: 01-18-1966 Age: 54 Y 11 M 24 D Return Phone Number: [phone] (Primary), [phone] (Secondary) Address: [address] City/State/Zip: [name] [address] Client [facility] Night - Client Client Site [facility] - Night Physician [name] - MD Contact Type Call Who Is Calling Patient / Member / Family / Caregiver Call Type Triage / Clinical Caller Name Aidin Doane Relationship To Patient Spouse Return Phone Number [phone] (Primary) Chief Complaint BREATHING - shortness of breath or sounds breathless Reason for Call Symptomatic / Request for Health Information Initial Comment Caller states that her husband got out of the hospital with Covid. He has Pneumonia and he is out of his Proventil HFR inhaler. He is on oxygen, but problems breathing. Translation No Nurse Assessment Nurse: Delafuente, RN, Irma Date/Time (Eastern Time): 09/04/2019 9:18:13 AM Confirm and document reason for call. If symptomatic, describe symptoms. ---Caller states that her husband has Covid-19 and pneumonia. He was discharged from the hospital a few days ago. States he is able to breath on his own, he was prescribed oxygen PRN. Caller is calling to request a refill of Proventil HFR inhaler. [facility], [address], [place] [phone]. Caller stated her husband is asymptomatic. Has the patient had close contact with a person known or suspected to have the novel coronavirus illness OR traveled / lives in area with major community spread (including international travel) in the last 14 days from the onset of symptoms? * If Asymptomatic, screen for exposure and travel within the last 14 days. ---Yes Does the patient have any new or worsening symptoms? ---Yes Will a triage be completed? ---Yes Related  visit to physician within the last 2 weeks? ---No Does the PT have any chronic conditions? (i.e. diabetes, asthma, this includes High risk factors for pregnancy, etc.) ---Yes List chronic conditions. ---DM, HTN, DVT Is this a behavioral health or substance abuse call? ---No PLEASE NOTE: All timestamps contained within this report are represented as [place] Standard Time. CONFIDENTIALTY NOTICE: This fax transmission is intended only for the addressee. It contains information that is legally privileged, confidential or otherwise protected from use or disclosure. If you are not the intended recipient, you are strictly prohibited from reviewing, disclosing, copying using or disseminating any of this information or taking any action in reliance on or regarding this information. If you have received this fax in error, please notify us immediately by telephone so that we can arrange for its return to [place]. Phone: [phone], Toll-Free: [phone], Fax: [phone] Page: 2 of 2 Call Id: [id number] Guidelines Guideline Title Affirmed Question Affirmed Notes Nurse Date/Time Lamount Cohen Time) Coronavirus (COVID-19) - Diagnosed or Suspected MILD difficulty breathing (e.g., minimal/no SOB at rest, SOB with walking, pulse <100) Delafuente, RN, Irma 09/04/2019 9:23:42 AM Disp. Time Lamount Cohen Time) Disposition Final User 09/04/2019 9:16:13 AM Send to Urgent Queue Brooke Pace 09/04/2019 9:37:31 AM Send To RN Personal Delafuente, RN, Darcel Bayley 09/04/2019 9:27:58 AM Go to ED Now (or PCP triage) Yes Delafuente, RN, Darcel Bayley Caller Disagree/Comply Comply Caller Understands Yes PreDisposition Call Doctor Care Advice Given Per Guideline GO TO ED NOW (OR PCP TRIAGE): GENERAL CARE ADVICE FOR COVID-19 SYMPTOMS: * The treatment is the same whether you have COVID-19, influenza or some other respiratory virus. * Cough: Use cough drops. * Feeling dehydrated: Drink extra liquids. If the air in your home is dry, use a humidifier.  *  Fever: For fever over 101 F (38.3 C), take acetaminophen every 4 to 6 hours (Adults 650 mg) OR ibuprofen every 6-8 hours (Adults 400 mg). Before taking any medicine, read all the instructions on the package. Do not take aspirin unless your doctor has prescribed it for you. * Muscle aches, headache, and other pains: Often this comes and goes with the fever. Take acetaminophen every 4-6 hours (Adults 650 mg) OR ibuprofen every 6 to 8 hours (Adults 400 mg). Before taking any medicine, read all the instructions on the package. * Sore throat: Try throat lozenges, hard candy or warm chicken broth. * IF NO PCP (PRIMARY CARE PROVIDER) SECOND-LEVEL TRIAGE: You need to be seen within the next hour. Go to the [facility] at _____________ [place] as soon as you can. ALTERNATE DISPOSITION - CALL TELEMEDICINE PROVIDER NOW: * Telemedicine may be your best choice for care during this COVID-19 outbreak. Comments User: Mickie Kay, RN Date/Time Eilene Ghazi Time): 09/04/2019 9:36:18 AM Contacted [facility] and spoke with Dominica, she stated rx for Proventil HFR inhaler was not ordered by office, unable to refill at this time. User: Mickie Kay, RN Date/Time Eilene Ghazi Time): 09/04/2019 9:47:10 AM Contacted caller, there was no answer left a voice message adivising her I was unable to obtain Proventil HFR inhaler, due to no rx was sent to pharmacy by the office. Referrals REFERRED TO PCP OFFICE

## 2019-09-04 NOTE — Telephone Encounter (Signed)
Pt's wife returned call. She reports the inhaler was prescribed when pt was in the hospital and he was to use it every 4 hours but they forgot to send in the script. She reports the med has been sent in today by a doctor in hospital and she will pick up today. She reports pt is doing much better. Advised if anything is needed to contact the clinic. She verbalized understanding.

## 2019-09-04 NOTE — Telephone Encounter (Signed)
LVM on both numbers 

## 2019-09-05 ENCOUNTER — Other Ambulatory Visit: Payer: Self-pay

## 2019-09-05 ENCOUNTER — Encounter: Payer: Self-pay | Admitting: Family Medicine

## 2019-09-05 ENCOUNTER — Ambulatory Visit (INDEPENDENT_AMBULATORY_CARE_PROVIDER_SITE_OTHER): Payer: 59 | Admitting: Family Medicine

## 2019-09-05 ENCOUNTER — Ambulatory Visit: Payer: Non-veteran care | Admitting: Family Medicine

## 2019-09-05 VITALS — HR 112 | Ht 73.0 in | Wt 282.0 lb

## 2019-09-05 DIAGNOSIS — U071 COVID-19: Secondary | ICD-10-CM | POA: Diagnosis not present

## 2019-09-05 DIAGNOSIS — I82441 Acute embolism and thrombosis of right tibial vein: Secondary | ICD-10-CM

## 2019-09-05 DIAGNOSIS — I82401 Acute embolism and thrombosis of unspecified deep veins of right lower extremity: Secondary | ICD-10-CM | POA: Insufficient documentation

## 2019-09-05 DIAGNOSIS — E1165 Type 2 diabetes mellitus with hyperglycemia: Secondary | ICD-10-CM

## 2019-09-05 DIAGNOSIS — J9601 Acute respiratory failure with hypoxia: Secondary | ICD-10-CM | POA: Diagnosis not present

## 2019-09-05 DIAGNOSIS — E118 Type 2 diabetes mellitus with unspecified complications: Secondary | ICD-10-CM | POA: Diagnosis not present

## 2019-09-05 DIAGNOSIS — J1282 Pneumonia due to coronavirus disease 2019: Secondary | ICD-10-CM

## 2019-09-05 DIAGNOSIS — IMO0002 Reserved for concepts with insufficient information to code with codable children: Secondary | ICD-10-CM

## 2019-09-05 DIAGNOSIS — Z86718 Personal history of other venous thrombosis and embolism: Secondary | ICD-10-CM | POA: Insufficient documentation

## 2019-09-05 HISTORY — DX: Acute embolism and thrombosis of unspecified deep veins of right lower extremity: I82.401

## 2019-09-05 NOTE — Assessment & Plan Note (Signed)
Seems to be recovering well post covid PNA treated in hospital with antibiotics and remdesivir. Has completed azithromycin course, finishing decadron steroid course as well. Home treatment reviewed. Home oxygen use reviewed - continue PRN activity. [facility] states [facility] in 1 month to assess continued need for supplemental oxygen. Will schedule 1 mo f/u visit in office for DM f/u and ambulatory pulse ox for ongoing oxygen need if [facility] hasn't seen by then.

## 2019-09-05 NOTE — Assessment & Plan Note (Signed)
New - covid-19 infection was inciting event.  Currently on eliquis 10mg  bid for total 1 wk, then will continue eliquis 5mg  bid for at least 3 months. Using coupon for eliquis, affordable.

## 2019-09-05 NOTE — Assessment & Plan Note (Signed)
Deteriorated glycemic control while on decadron.  Latest A1c 8.2%. metformin was increased to 1000mg  bid in hospital - seems to be tolerating higher metformin dose at this time. Discussed possible need to decrease metformin dose once off decadron. Reassess at f/u visit next month.

## 2019-09-05 NOTE — Assessment & Plan Note (Signed)
Improving. Now on supplemental O2

## 2019-09-05 NOTE — Assessment & Plan Note (Signed)
16 lb weight loss after hospitalization - will continue to watch.

## 2019-09-05 NOTE — Progress Notes (Signed)
Virtual visit completed through [employer]. Due to national recommendations of social distancing due to COVID-19, a virtual visit is felt to be most appropriate for this patient at this time. Reviewed limitations of a virtual visit.   Patient location: home Provider location: [facility], office If any vitals were documented, they were collected by patient at home unless specified below.    Pulse (!) 112   Ht 6\' 1"  (1.854 m)   Wt 282 lb (127.9 kg)   SpO2 92% Comment: 2L [place]  BMI 37.21 kg/m    CC: hosp f/u visit Subjective:    Patient ID: Jonathon [name], male    DOB: 1965-10-23, 54 y.o.   MRN: [id number]  HPI: Jonathon [name] is a 54 y.o. male presenting on 09/05/2019 for Hospitalization Follow-up   Recent hospitalization for covid pneumonia and subsequently found to have bilateral LE DVTs treated with lovenox transitioned to eliquis. Treated with remdesivir, decadron to complete 6d outpatient taper, treated with rocephin/azithromycin IV and completed PO azithromycin course. Discharged with home oxygen. Sugars were up to 400s during hospitalization, placed on metformin 1000mg  bid, continued jardiance and glipizide.   Last decadron dose is today.  Eliquis is very expensive - he is using coupon.   Since home, overall feeling much better. Dyspnea persists but slowly improving. Cough improving. No fevers/chills, loss of taste/smell, abd pain, nausea, diarrhea.  O2 through [place]. Advised to continue oxygen for a month then will have re evaluation at home. On 2L O2 PRN exertion by . Not really using when at rest. He has pulse ox at home.   Asks about return to work - given 10 days to return to work.  Occupation: [employer], walks the campus.   Admit date: 08/25/2019  Discharge date: 09/02/2019  TCM hosp f/u phone call completed on 09/03/2019   Recommendations for Outpatient Follow-up:  1. Patient's Metformin increased to twice daily in setting of use of Decadron may need to come back down  off of that dose in the next several days to weeks 2. Recommend Chem-7, CBC 1 week 3. Diagnosed with DVT this admission given Eliquis prescription suggest completion of the same in 3 months total 4. Work note given  Discharge Diagnoses:  Principal Problem:   Pneumonia due to COVID-19 virus Active Problems:   Diabetes mellitus type 2, uncontrolled, with complications ([place])   Hypertension, essential   Dyslipidemia associated with type 2 diabetes mellitus ([place])   Obesity, morbid, BMI 40.0-49.9 ([place])   Diabetic neuropathy ([place])   Respiratory failure, acute ([place])  Discharge Condition: Improved Diet recommendation: Diabetic     Relevant past medical, surgical, family and social history reviewed and updated as indicated. Interim medical history since our last visit reviewed. Allergies and medications reviewed and updated. Outpatient Medications Prior to Visit  Medication Sig Dispense Refill  . albuterol (VENTOLIN HFA) 108 (90 Base) MCG/ACT inhaler Inhale 2 puffs into the lungs every 6 (six) hours as needed for wheezing or shortness of breath. 18 g 0  . [START ON 09/13/2019] apixaban (ELIQUIS) 5 MG TABS tablet Take 1 tablet (5 mg total) by mouth 2 (two) times daily. 150 tablet 0  . atorvastatin (LIPITOR) 40 MG tablet Take 40 mg by mouth daily.    . clotrimazole (LOTRIMIN) 1 % cream APPLY TO AFFECTED AREA TWICE A DAY (Patient taking differently: Apply 1 application topically 2 (two) times daily as needed (rash). ) 60 g 1  . dexamethasone (DECADRON) 6 MG tablet Take 1 tablet (6  mg total) by mouth daily for 3 days. 3 tablet 0  . gabapentin (NEURONTIN) 300 MG capsule Take 300 mg by mouth as needed.    Marland Kitchen glipiZIDE (GLUCOTROL) 10 MG tablet Take 1 tablet (10 mg total) by mouth 2 (two) times daily before lunch and supper. 180 tablet 3  . glucose blood (ONE TOUCH ULTRA TEST) test strip Check blood sugar twice a day and as instructed. Dx 250.00 100 each 3  . JARDIANCE 25 MG TABS tablet TAKE 1 TABLET  BY MOUTH EVERY DAY (Patient taking differently: Take 25 mg by mouth every morning. ) 30 tablet 6  . metFORMIN (GLUCOPHAGE) 500 MG tablet Take 2 tablets (1,000 mg total) by mouth 2 (two) times daily with a meal. 180 tablet 3  . pantoprazole (PROTONIX) 20 MG tablet Take 1 tablet (20 mg total) by mouth daily. 30 tablet 1  . sildenafil (VIAGRA) 100 MG tablet TAKE 0.5-1 TABLETS (50-100 MG TOTAL) BY MOUTH DAILY AS NEEDED FOR ERECTILE DYSFUNCTION. 5 tablet 5  . apixaban (ELIQUIS) 5 MG TABS tablet Take 2 tablets (10 mg total) by mouth 2 (two) times daily for 14 doses. 28 tablet 0   No facility-administered medications prior to visit.     Per HPI unless specifically indicated in ROS section below Review of Systems Objective:    Pulse (!) 112   Ht 6\' 1"  (1.854 m)   Wt 282 lb (127.9 kg)   SpO2 92% Comment: 2L [place]  BMI 37.21 kg/m   Wt Readings from Last 3 Encounters:  09/05/19 282 lb (127.9 kg)  08/25/19 298 lb (135.2 kg)  05/30/19 299 lb 7 oz (135.8 kg)     Physical exam: Gen: alert, NAD, not ill appearing Pulm: speaks in complete sentences without increased work of breathing Psych: normal mood, normal thought content      Lab Results  Component Value Date   HGBA1C 8.2 (H) 08/25/2019    BLE venous 10/23/2019 08/2019: Findings consistent with acute deep vein thrombosis involving the right posterior tibial veins, and right gastrocnemius veins.  Assessment & Plan:   Problem List Items Addressed This Visit    Respiratory failure, acute (HCC)    Improving. Now on supplemental O2       Pneumonia due to COVID-19 virus - Primary    Seems to be recovering well post covid PNA treated in hospital with antibiotics and remdesivir. Has completed azithromycin course, finishing decadron steroid course as well. Home treatment reviewed. Home oxygen use reviewed - continue PRN activity. He states [facility] is planning to come out to house in 1 month to assess continued need for supplemental oxygen. Will schedule 1 mo  f/u visit in office for DM f/u and ambulatory pulse ox for ongoing oxygen need if [facility] hasn't seen by then.       Obesity, morbid, BMI 40.0-49.9 (HCC)    16 lb weight loss after hospitalization - will continue to watch.       Diabetes mellitus type 2, uncontrolled, with complications (HCC)    Deteriorated glycemic control while on decadron.  Latest A1c 8.2%. metformin was increased to 1000mg  bid in hospital - seems to be tolerating higher metformin dose at this time. Discussed possible need to decrease metformin dose once off decadron. Reassess at f/u visit next month.       Relevant Medications   atorvastatin (LIPITOR) 40 MG tablet   Acute deep vein thrombosis (DVT) of right lower extremity (HCC)    New - covid-19 infection was  inciting event.  Currently on eliquis 10mg  bid for total 1 wk, then will continue eliquis 5mg  bid for at least 3 months. Using coupon for eliquis, affordable.       Relevant Medications   atorvastatin (LIPITOR) 40 MG tablet       No orders of the defined types were placed in this encounter.  No orders of the defined types were placed in this encounter.   I discussed the assessment and treatment plan with the patient. The patient was provided an opportunity to ask questions and all were answered. The patient agreed with the plan and demonstrated an understanding of the instructions. The patient was advised to call back or seek an in-person evaluation if the symptoms worsen or if the condition fails to improve as anticipated.  Follow up plan: Return if symptoms worsen or fail to improve.  , MD

## 2019-09-08 ENCOUNTER — Telehealth: Payer: Self-pay | Admitting: *Deleted

## 2019-09-08 NOTE — Telephone Encounter (Signed)
Diane Case Manager with [employer] called stating that the patient has signed up for a program where the nurses are available for education, support and resources. Diane requested that the patient's last office notes, recent labs and medication list be faxed to her. Advised Diane that she will need to request this in writing. Diane stated that she will fax the request to the office.

## 2019-09-08 NOTE — Telephone Encounter (Signed)
Received fax from [facility] [place] requesting pt's info.    I spoke with pt to confirm that he signed up for the program.  Pt denies, to his knowledge, signing up.  Made that note on the fax and faxed it back to [facility].

## 2019-09-10 ENCOUNTER — Encounter: Payer: Self-pay | Admitting: Family Medicine

## 2019-09-10 ENCOUNTER — Other Ambulatory Visit: Payer: Self-pay | Admitting: Family Medicine

## 2019-09-10 NOTE — Telephone Encounter (Signed)
Letter written 09/07/2019 and in chart

## 2019-09-23 ENCOUNTER — Telehealth: Payer: Self-pay | Admitting: *Deleted

## 2019-09-23 NOTE — Telephone Encounter (Signed)
Patient left a voicemail stating that he feels good and wants to return to work Monday. Patient requested a note for work.

## 2019-09-24 ENCOUNTER — Other Ambulatory Visit: Payer: Self-pay | Admitting: Family Medicine

## 2019-09-24 NOTE — Telephone Encounter (Signed)
Glad he's doing better. I wrote him a letter last month that said he should stay out through Monday March 15th. I want him to keep appt on Monday for ambulatory pulse ox and if passed he may return to work after office visit.

## 2019-09-24 NOTE — Telephone Encounter (Signed)
Electronic refill request Viagra Last refill 07/16/19 #5/5 refills Last office visit 09/05/19

## 2019-09-24 NOTE — Telephone Encounter (Signed)
Spoke with pt relaying Dr. G's message.  Verbalizes understanding.  

## 2019-09-29 ENCOUNTER — Ambulatory Visit (INDEPENDENT_AMBULATORY_CARE_PROVIDER_SITE_OTHER): Payer: 59 | Admitting: Family Medicine

## 2019-09-29 ENCOUNTER — Encounter: Payer: Self-pay | Admitting: Family Medicine

## 2019-09-29 ENCOUNTER — Other Ambulatory Visit: Payer: Self-pay

## 2019-09-29 VITALS — BP 130/76 | HR 91 | Temp 97.8°F | Ht 73.0 in | Wt 290.4 lb

## 2019-09-29 DIAGNOSIS — I82441 Acute embolism and thrombosis of right tibial vein: Secondary | ICD-10-CM

## 2019-09-29 DIAGNOSIS — E118 Type 2 diabetes mellitus with unspecified complications: Secondary | ICD-10-CM

## 2019-09-29 DIAGNOSIS — IMO0002 Reserved for concepts with insufficient information to code with codable children: Secondary | ICD-10-CM

## 2019-09-29 DIAGNOSIS — U071 COVID-19: Secondary | ICD-10-CM | POA: Diagnosis not present

## 2019-09-29 DIAGNOSIS — J1282 Pneumonia due to coronavirus disease 2019: Secondary | ICD-10-CM

## 2019-09-29 DIAGNOSIS — E1165 Type 2 diabetes mellitus with hyperglycemia: Secondary | ICD-10-CM

## 2019-09-29 MED ORDER — ALBUTEROL SULFATE HFA 108 (90 BASE) MCG/ACT IN AERS: 2.0000 | INHALATION_SPRAY | Freq: Four times a day (QID) | RESPIRATORY_TRACT | 0 refills | Status: DC | PRN

## 2019-09-29 NOTE — Assessment & Plan Note (Addendum)
Largely resolved from covid pneumonia last month.  Lungs clear today.  No longer needing oxygen - will ask for tanks to be picked up.  Cleared to return to work.  Will refill albuterol PRN which he feels is helpful.

## 2019-09-29 NOTE — Assessment & Plan Note (Signed)
Provoked DVT, anticoagulation started ~08/27/2019 rec to complete full 3 months. Tolerating eliquis well.

## 2019-09-29 NOTE — Assessment & Plan Note (Signed)
Weight gain noted. He is hopeful to be able to afford ozempic which was helping with both glycemic control and weight loss.

## 2019-09-29 NOTE — Patient Instructions (Signed)
You are doing well today! Letter for work provided [employer] 3 months of eliquis. Schedule 2 month diabetes check.

## 2019-09-29 NOTE — Progress Notes (Signed)
This visit was conducted in person.  BP 130/76 (BP Location: Left Arm, Patient Position: Sitting, Cuff Size: Large)   Pulse 91   Temp 97.8 F (36.6 C) (Temporal)   Ht _0  (1.854 m)   Wt 290 lb 7 oz (131.7 kg)   SpO2 97%   BMI 38.32 kg/m    CC: 3 mo f/u visit  Subjective:    Patient ID: Rawlin Reaume, male    DOB: 1966-02-21, 54 y.o.   MRN: [id number]  HPI: Ruben Mahler is a 54 y.o. male presenting on 09/29/2019 for Follow-up (Here for 3-4 mo f/u.) and Letter for School/Work (Needs note to return to work. )   See prior note for details.  Recent hospitalization for covid pneumonia complicated by RLE DVT treating with eliquis. He received remdesivir and decadron as well as rocephin/azithromycin.  Discharged with home oxygen requirement - has been able to wean off this.  Sugars were markedly elevated while on decadron, treated with jardiance, glipizide, and metformin 1051m bid.   Ready to go back to work.  Requests oxygen removed from home as he's no longer using. Passes ambulatory pulse ox today.   Sugars markedly better since coming off decadron.  AM cbg 110-120 PM cbg 180s Continues metformin 10044mbid, glipizide 1053mwice daily with food.   Not taking ozempic or jardiance - they were unaffordable even with a coupon.   No noted bleeding on eliquis. No leg pains or swelling anymore.      Relevant past medical, surgical, family and social history reviewed and updated as indicated. Interim medical history since our last visit reviewed. Allergies and medications reviewed and updated. Outpatient Medications Prior to Visit  Medication Sig Dispense Refill  . apixaban (ELIQUIS) 5 MG TABS tablet Take 1 tablet (5 mg total) by mouth 2 (two) times daily. 150 tablet 0  . atorvastatin (LIPITOR) 40 MG tablet TAKE 1 TABLET (40 MG TOTAL) BY MOUTH DAILY AT 6 PM. 90 tablet 3  . clotrimazole (LOTRIMIN) 1 % cream APPLY TO AFFECTED AREA TWICE A DAY (Patient taking differently: Apply 1  application topically 2 (two) times daily as needed (rash). ) 60 g 1  . gabapentin (NEURONTIN) 300 MG capsule Take 300 mg by mouth as needed.    . gMarland KitchenipiZIDE (GLUCOTROL) 10 MG tablet Take 1 tablet (10 mg total) by mouth 2 (two) times daily before lunch and supper. 180 tablet 3  . glucose blood (ONE TOUCH ULTRA TEST) test strip Check blood sugar twice a day and as instructed. Dx 250.00 100 each 3  . JARDIANCE 25 MG TABS tablet TAKE 1 TABLET BY MOUTH EVERY DAY (Patient taking differently: Take 25 mg by mouth every morning. ) 30 tablet 6  . metFORMIN (GLUCOPHAGE) 500 MG tablet Take 2 tablets (1,000 mg total) by mouth 2 (two) times daily with a meal. 180 tablet 3  . pantoprazole (PROTONIX) 20 MG tablet Take 1 tablet (20 mg total) by mouth daily. 30 tablet 1  . sildenafil (VIAGRA) 100 MG tablet TAKE 0.5-1 TABLETS (50-100 MG TOTAL) BY MOUTH DAILY AS NEEDED FOR ERECTILE DYSFUNCTION. 5 tablet 5  . albuterol (VENTOLIN HFA) 108 (90 Base) MCG/ACT inhaler Inhale 2 puffs into the lungs every 6 (six) hours as needed for wheezing or shortness of breath. 18 g 0  . apixaban (ELIQUIS) 5 MG TABS tablet Take 2 tablets (10 mg total) by mouth 2 (two) times daily for 14 doses. 28 tablet 0   No facility-administered medications prior to visit.  Per HPI unless specifically indicated in ROS section below Review of Systems Objective:    BP 130/76 (BP Location: Left Arm, Patient Position: Sitting, Cuff Size: Large)   Pulse 91   Temp 97.8 F (36.6 C) (Temporal)   Ht _0  (1.854 m)   Wt 290 lb 7 oz (131.7 kg)   SpO2 97%   BMI 38.32 kg/m   Wt Readings from Last 3 Encounters:  09/29/19 290 lb 7 oz (131.7 kg)  09/05/19 282 lb (127.9 kg)  08/25/19 298 lb (135.2 kg)    Physical Exam Vitals and nursing note reviewed.  Constitutional:      Appearance: Normal appearance. He is not ill-appearing.  Eyes:     Extraocular Movements: Extraocular movements intact.     Pupils: Pupils are equal, round, and reactive to  light.  Cardiovascular:     Rate and Rhythm: Normal rate and regular rhythm.     Pulses: Normal pulses.     Heart sounds: Normal heart sounds. No murmur.  Pulmonary:     Effort: Pulmonary effort is normal. No respiratory distress.     Breath sounds: Normal breath sounds. No wheezing, rhonchi or rales.  Musculoskeletal:        General: No tenderness.     Right lower leg: No edema.     Left lower leg: No edema.  Neurological:     Mental Status: He is alert.  Psychiatric:        Mood and Affect: Mood normal.        Behavior: Behavior normal.       Lab Results  Component Value Date   CREATININE 0.97 09/02/2019   BUN 28 (H) 09/02/2019   NA 142 09/02/2019   K 4.2 09/02/2019   CL 102 09/02/2019   CO2 26 09/02/2019    Lab Results  Component Value Date   HGBA1C 8.2 (H) 08/25/2019    Assessment & Plan:  This visit occurred during the SARS-CoV-2 public health emergency.  Safety protocols were in place, including screening questions prior to the visit, additional usage of staff PPE, and extensive cleaning of exam room while observing appropriate contact time as indicated for disinfecting solutions.   Problem List Items Addressed This Visit    Pneumonia due to COVID-19 virus - Primary    Largely resolved from covid pneumonia last month.  Lungs clear today.  No longer needing oxygen - will ask for tanks to be picked up.  Cleared to return to work.  Will refill albuterol PRN which he feels is helpful.       Relevant Medications   albuterol (VENTOLIN HFA) 108 (90 Base) MCG/ACT inhaler   Other Relevant Orders   For home use only DME oxygen   Obesity, morbid, BMI 40.0-49.9 ([place])    Weight gain noted. He is hopeful to be able to afford ozempic which was helping with both glycemic control and weight loss.       Diabetes mellitus type 2, uncontrolled, with complications ([place])    Poor control after decadron but he states that sugars are now better controlled since completing steroid  course. He is off ozempic, jardiance due to cost. Hopeful to be able to restart once deductible is met (anticipate soon given recent hospitalization). I did ask him to return in 2 months for DM f/u visit.       Acute deep vein thrombosis (DVT) of right lower extremity (HCC)    Provoked DVT, anticoagulation started ~08/27/2019 rec to complete full 3  months. Tolerating eliquis well.           Meds ordered this encounter  Medications  . DISCONTD: albuterol (VENTOLIN HFA) 108 (90 Base) MCG/ACT inhaler    Sig: Inhale 2 puffs into the lungs every 6 (six) hours as needed for wheezing or shortness of breath.    Dispense:  18 g    Refill:  0  . albuterol (VENTOLIN HFA) 108 (90 Base) MCG/ACT inhaler    Sig: Inhale 2 puffs into the lungs every 6 (six) hours as needed for wheezing or shortness of breath.    Dispense:  18 g    Refill:  0   Orders Placed This Encounter  Procedures  . For home use only DME oxygen    Please pick up oxygen supplies - pt has completed treatment course    Order Specific Question:   Length of Need    Answer:   6 Months    Order Specific Question:   Mode or (Route)    Answer:   Nasal cannula    Order Specific Question:   Oxygen delivery system    Answer:   Gas    Patient Instructions  You are doing well today! Letter for work provided [employer] 3 months of eliquis. Schedule 2 month diabetes check.    Follow up plan: Return in about 2 months (around 11/29/2019) for follow up visit.  Ria Bush, MD

## 2019-09-29 NOTE — Assessment & Plan Note (Addendum)
Poor control after decadron but he states that sugars are now better controlled since completing steroid course. He is off ozempic, jardiance due to cost. Hopeful to be able to restart once deductible is met (anticipate soon given recent hospitalization). I did ask him to return in 2 months for DM f/u visit.

## 2019-10-01 ENCOUNTER — Telehealth: Payer: Self-pay

## 2019-10-01 NOTE — Telephone Encounter (Signed)
Dr. Reece Agar has cleared pt to discontinue home O2.    Spoke with pt asking name of company and phn # of who supplied O2.  Says he will have to call back with that info.  I will then contact them for a fax # to send order.   [Printed order is in [employer], broadcasting/film/video on [employer].]

## 2019-10-02 ENCOUNTER — Telehealth: Payer: Self-pay | Admitting: Family Medicine

## 2019-10-02 NOTE — Telephone Encounter (Signed)
Patient's wife, Archie Patten, called.  Patient was put on oxygen and Dr.G released patient to go back to work.  Patient is asking for [place] to call [facility] to pick up the oxygen because they need proof Dr. Reece Agar is okay with patient being off oxygen.  [facility] phone number is [phone] or [phone].

## 2019-10-02 NOTE — Telephone Encounter (Signed)
See TE, 10/02/19.

## 2019-10-02 NOTE — Telephone Encounter (Signed)
I called phn # provided to get a fax # to send order to discontinue home O2 use [see TE, 10/01/19].  Told they have to get an electronic order from the [facility] hospital pt is associated with.   Spoke with pt relaying message above.  Says he will contact his [facility] doc to have order faxed.  Expresses his thanks for trying.

## 2019-10-09 ENCOUNTER — Other Ambulatory Visit: Payer: Self-pay

## 2019-11-10 ENCOUNTER — Telehealth: Payer: Self-pay

## 2019-11-10 NOTE — Telephone Encounter (Signed)
Patients spouse Jonathon [name] came in to office to report concerns for her husband recovering from COVID.  He had Covid back in February with residual blood clotting.  Patient's wife states that his breathing has become worse and she feels that he needs to wear his oxygen all of the time now. He is not wanting to wear it, "unless Dr. Reece Agar tells him he needs to".    In addition, she notices that he is having some confusion at times and she doesn't want him to know she has mentioned this, but his alcohol consumption has also increased and she worries because he is on blood thinners.   She is highly anxious today for her husband and wants Dr. Reece Agar to know and hopefully speak with her husband to be sure that he is okay.     They recently had a family friend die several months post COVID and she is very worried for her husband.   While patient's wife was here, he called her cell and she put me on the phone with him.  He denies any acute exacerbation of his symptoms and states that he does not feel that he needs oxygen. He also told me that his O2Sats were running 91-92 on exertion and 98-99 while at rest both without supplemental  oxygen use.    He states that he continues to feel about the same as he did when seen in March and doesn't think he is getting any worse.    Wife concerned and would like him seen and possible have an xray.  Patient is agreeable to coming in for a face to face with Dr. Reece Agar and an appointment has been made for 8am tomorrow (11/11/19).  He denies any acute fever or other chest/respiratory symptom and is stable.  Okay for in office appointment.   Wife asked if she could come with patient.  She is not on any DPR for patient so I told her that would be up to him and I could not disclose any further information with her.  When I went back in exam room to let patient's wife leave, she was already on the phone with him and he told her about appointment on his own.   Patient and wife both thank me for  my our time and concern.   FYI to Dr. Reece Agar.

## 2019-11-10 NOTE — Telephone Encounter (Signed)
Noted. Thank you. Will see tomorrow.

## 2019-11-11 ENCOUNTER — Ambulatory Visit (INDEPENDENT_AMBULATORY_CARE_PROVIDER_SITE_OTHER)
Admission: RE | Admit: 2019-11-11 | Discharge: 2019-11-11 | Disposition: A | Discharge status: Home / Self Care | Payer: 59 | Source: Ambulatory Visit | Attending: Family Medicine | Admitting: Family Medicine

## 2019-11-11 ENCOUNTER — Encounter: Payer: Self-pay | Admitting: Family Medicine

## 2019-11-11 ENCOUNTER — Ambulatory Visit (INDEPENDENT_AMBULATORY_CARE_PROVIDER_SITE_OTHER): Payer: 59 | Admitting: Family Medicine

## 2019-11-11 ENCOUNTER — Other Ambulatory Visit: Payer: Self-pay

## 2019-11-11 VITALS — BP 120/78 | HR 97 | Temp 97.9°F | Ht 73.0 in | Wt 296.2 lb

## 2019-11-11 DIAGNOSIS — I82441 Acute embolism and thrombosis of right tibial vein: Secondary | ICD-10-CM | POA: Diagnosis not present

## 2019-11-11 DIAGNOSIS — E1140 Type 2 diabetes mellitus with diabetic neuropathy, unspecified: Secondary | ICD-10-CM | POA: Diagnosis not present

## 2019-11-11 DIAGNOSIS — R0683 Snoring: Secondary | ICD-10-CM

## 2019-11-11 DIAGNOSIS — R0602 Shortness of breath: Secondary | ICD-10-CM

## 2019-11-11 DIAGNOSIS — G4733 Obstructive sleep apnea (adult) (pediatric): Secondary | ICD-10-CM | POA: Insufficient documentation

## 2019-11-11 DIAGNOSIS — R0609 Other forms of dyspnea: Secondary | ICD-10-CM | POA: Insufficient documentation

## 2019-11-11 DIAGNOSIS — G4731 Primary central sleep apnea: Secondary | ICD-10-CM | POA: Insufficient documentation

## 2019-11-11 MED ORDER — APIXABAN 5 MG PO TABS: 5.0000 mg | ORAL_TABLET | Freq: Two times a day (BID) | ORAL | 2 refills | Status: DC

## 2019-11-11 NOTE — Assessment & Plan Note (Signed)
Incomplete treatment - due to confusion over dosing. He has been taking eliquis 5mg  once daily for the past 1-2 months, and then stopped 2 wks ago. He was supposed to be taking eliquis 5mg  bid for 3 months to end mid next month. I recommend restarting eliquis BID course for full 3 months, reviewed how to take this.

## 2019-11-11 NOTE — Patient Instructions (Addendum)
We will refer you to sleep doctor. You stopped eliquis too soon. I recommend restarting 3 month course - 5mg  twice daily for 3 months (to finish end of July). I have sent this to your pharmacy. Chest xray today.  Return in 3 months for diabetes follow up visit with labs.

## 2019-11-11 NOTE — Assessment & Plan Note (Addendum)
Refer to pulm for OSA eval, interested in eval of overnight oxygen need as well. For now may continue oxygen at night time.

## 2019-11-11 NOTE — Assessment & Plan Note (Signed)
Ongoing weight gain noted. Has just recently restarted ozempic.

## 2019-11-11 NOTE — Assessment & Plan Note (Signed)
Discussed alcohol use - denies significant use.  Will need b12 level checked next labs.  Discussed sugar contribution to neuropathy symptoms.

## 2019-11-11 NOTE — Assessment & Plan Note (Signed)
Sensation of having to take deep breaths without true dyspnea. Anticipate benign cause. Check CXR today.

## 2019-11-11 NOTE — Progress Notes (Signed)
This visit was conducted in person.  BP 120/78 (BP Location: Left Arm, Patient Position: Sitting, Cuff Size: Large)   Pulse 97   Temp 97.9 F (36.6 C) (Temporal)   Ht 6\' 1"  (1.854 m)   Wt 296 lb 4 oz (134.4 kg)   SpO2 98%   BMI 39.09 kg/m    CC: COVID PNA f/u visit  Subjective:    Patient ID: Jonathon [name], male    DOB: [birth year], 54 y.o.   MRN: [id number]  HPI: Jonathon [name] is a 54 y.o. male presenting on 11/11/2019 for Follow-up (Here for pneumonia and COVID sxs f/u.  Has to take a deep breath during the day. )   Hospitalized 08/2019 for COVID pneumonia complicated by RLE DVT planned eliquis for 3 months to complete 11/2019. He received remdesivier and decadron, then rocephin/azithromycin. Discharged on home oxygen, was able to wean off this by 09/29/2019.   Presents today as wife remains concerned about breathing status. Friend recently died from PE post COVID. He notes occasional need to take deep breath. This can happen either at rest or with exertion. He has not been able to have anyone come pick up oxygen tanks. He continues using O2 at night time.   Has not been taking eliquis correctly - for the past month was taking eliquis 5mg  once daily, then ran out 2 wks ago   Doesn't think he has OSA. No HA, endorses restorative sleep, no daytime somnolence. Occasional snoring, wife endorses nightmares as he sometimes wakes up fighting or shouting, jerking in his sleep. No PNDyspnea. No RLS symptoms.   Denies fevers/chills, wheezing, cough, true dyspnea.  Back to work without oxygen - working at 10/01/2019 - lots of walking - does ok with this.  Lab Results  Component Value Date   HGBA1C 8.2 (H) 08/25/2019  Sugars running well - fasting 120, evening 220.  Continues jardiance and ozempic (restarted 2 wks ago).   Endorses ongoing neuropathy - burning tingling numbness of bilateral feet.  No results found for: VITAMINB12  Alcohol - 1 beer every few weeks. Drinking "alcohol free wine" (Fre  bought at [facility] - 0.5% alcohol). No liquor.      Relevant past medical, surgical, family and social history reviewed and updated as indicated. Interim medical history since our last visit reviewed. Allergies and medications reviewed and updated. Outpatient Medications Prior to Visit  Medication Sig Dispense Refill  . albuterol (VENTOLIN HFA) 108 (90 Base) MCG/ACT inhaler Inhale 2 puffs into the lungs every 6 (six) hours as needed for wheezing or shortness of breath. 18 g 0  . atorvastatin (LIPITOR) 40 MG tablet TAKE 1 TABLET (40 MG TOTAL) BY MOUTH DAILY AT 6 PM. 90 tablet 3  . clotrimazole (LOTRIMIN) 1 % cream APPLY TO AFFECTED AREA TWICE A DAY (Patient taking differently: Apply 1 application topically 2 (two) times daily as needed (rash). ) 60 g 1  . gabapentin (NEURONTIN) 300 MG capsule Take 300 mg by mouth as needed.    10/23/2019 glipiZIDE (GLUCOTROL) 10 MG tablet Take 1 tablet (10 mg total) by mouth 2 (two) times daily before lunch and supper. 180 tablet 3  . glucose blood (ONE TOUCH ULTRA TEST) test strip Check blood sugar twice a day and as instructed. Dx 250.00 100 each 3  . JARDIANCE 25 MG TABS tablet TAKE 1 TABLET BY MOUTH EVERY DAY (Patient taking differently: Take 25 mg by mouth every morning. ) 30 tablet 6  . metFORMIN (GLUCOPHAGE) 500 MG tablet Take  2 tablets (1,000 mg total) by mouth 2 (two) times daily with a meal. 180 tablet 3  . pantoprazole (PROTONIX) 20 MG tablet Take 1 tablet (20 mg total) by mouth daily. 30 tablet 1  . sildenafil (VIAGRA) 100 MG tablet TAKE 0.5-1 TABLETS (50-100 MG TOTAL) BY MOUTH DAILY AS NEEDED FOR ERECTILE DYSFUNCTION. 5 tablet 5  . apixaban (ELIQUIS) 5 MG TABS tablet Take 2 tablets (10 mg total) by mouth 2 (two) times daily for 14 doses. 28 tablet 0  . apixaban (ELIQUIS) 5 MG TABS tablet Take 1 tablet (5 mg total) by mouth 2 (two) times daily. 150 tablet 0   No facility-administered medications prior to visit.     Per HPI unless specifically indicated in ROS  section below Review of Systems Objective:    BP 120/78 (BP Location: Left Arm, Patient Position: Sitting, Cuff Size: Large)   Pulse 97   Temp 97.9 F (36.6 C) (Temporal)   Ht 6\' 1"  (1.854 m)   Wt 296 lb 4 oz (134.4 kg)   SpO2 98%   BMI 39.09 kg/m   Wt Readings from Last 3 Encounters:  11/11/19 296 lb 4 oz (134.4 kg)  09/29/19 290 lb 7 oz (131.7 kg)  09/05/19 282 lb (127.9 kg)    Physical Exam Vitals and nursing note reviewed.  Constitutional:      Appearance: Normal appearance. He is not ill-appearing.  Cardiovascular:     Rate and Rhythm: Normal rate and regular rhythm.     Pulses: Normal pulses.     Heart sounds: Normal heart sounds. No murmur.  Pulmonary:     Effort: Pulmonary effort is normal. No respiratory distress.     Breath sounds: Normal breath sounds. No wheezing, rhonchi or rales.  Musculoskeletal:     Right lower leg: No edema.     Left lower leg: No edema.  Skin:    General: Skin is warm and dry.     Findings: No rash.  Neurological:     Mental Status: He is alert.  Psychiatric:        Mood and Affect: Mood normal.        Behavior: Behavior normal.        Assessment & Plan:  This visit occurred during the SARS-CoV-2 public health emergency.  Safety protocols were in place, including screening questions prior to the visit, additional usage of staff PPE, and extensive cleaning of exam room while observing appropriate contact time as indicated for disinfecting solutions.   Problem List Items Addressed This Visit    Type 2 diabetes mellitus with diabetic neuropathy, unspecified (HCC)    Discussed alcohol use - denies significant use.  Will need b12 level checked next labs.  Discussed sugar contribution to neuropathy symptoms.       Snoring    Refer to pulm for OSA eval, interested in eval of overnight oxygen need as well. For now may continue oxygen at night time.       Relevant Orders   Ambulatory referral to Pulmonology   Shortness of breath -  Primary    Sensation of having to take deep breaths without true dyspnea. Anticipate benign cause. Check CXR today.       Relevant Orders   DG Chest 2 View   Obesity, morbid, BMI 40.0-49.9 (HCC)    Ongoing weight gain noted. Has just recently restarted ozempic.       Acute deep vein thrombosis (DVT) of right lower extremity (HCC)    Incomplete  treatment - due to confusion over dosing. He has been taking eliquis 5mg  once daily for the past 1-2 months, and then stopped 2 wks ago. He was supposed to be taking eliquis 5mg  bid for 3 months to end mid next month. I recommend restarting eliquis BID course for full 3 months, reviewed how to take this.       Relevant Medications   apixaban (ELIQUIS) 5 MG TABS tablet       Meds ordered this encounter  Medications  . apixaban (ELIQUIS) 5 MG TABS tablet    Sig: Take 1 tablet (5 mg total) by mouth 2 (two) times daily.    Dispense:  60 tablet    Refill:  2    Restarting 3 month course.   Orders Placed This Encounter  Procedures  . DG Chest 2 View    Standing Status:   Future    Standing Expiration Date:   01/10/2021    Order Specific Question:   Reason for Exam (SYMPTOM  OR DIAGNOSIS REQUIRED)    Answer:   post covid PNA occasional dyspnea    Order Specific Question:   Preferred imaging location?    Answer:   [facility]    Order Specific Question:   Radiology Contrast Protocol - do NOT remove file path    Answer:   [email or web address]  . Ambulatory referral to Pulmonology    Referral Priority:   Routine    Referral Type:   Consultation    Referral Reason:   Specialty Services Required    Requested Specialty:   Pulmonary Disease    Number of Visits Requested:   1    Patient Instructions  We will refer you to sleep doctor. You stopped eliquis too soon. I recommend restarting 3 month course - 5mg  twice daily for 3 months (to finish end of July). I have sent this to your pharmacy. Chest  xray today.  Return in 3 months for diabetes follow up visit with labs.    Follow up plan: Return in about 3 months (around 02/10/2020) for follow up visit.  Ria Bush, MD

## 2019-11-16 ENCOUNTER — Other Ambulatory Visit: Payer: Self-pay | Admitting: Family Medicine

## 2019-12-09 ENCOUNTER — Telehealth: Payer: Self-pay

## 2019-12-09 NOTE — Telephone Encounter (Signed)
Pt's wife called back with spouse in background and said pt does not want to go to [place] would prefer [place]. I gave them the number to call [facility] pulmonology.

## 2019-12-09 NOTE — Telephone Encounter (Signed)
[facility] Night - Client Nonclinical Telephone Record AccessNurse Client [facility] Night - Client Client Site [facility] [place] - Night Physician [name] - MD Contact Type Call Who Is Calling Patient / Member / Family / Caregiver Caller Name Braedon Sjogren Caller Phone Number [phone] Patient Name Marlin Jarrard Patient DOB 1965-12-30 Call Type Message Only Information Provided Reason for Call Request for General Office Information Initial Comment caller states the doctor wants her spouse to have a sleep study but they dont know who to contact to set that up Additional Comment Disp. Time Disposition Final User 12/09/2019 7:43:56 AM General Information Provided Yes Tiburcio Pea, Lanette Call Closed By: Evette Doffing Transaction Date/Time: 12/09/2019 7:41:23 AM (ET)

## 2019-12-09 NOTE — Telephone Encounter (Signed)
I spoke with Charmaine [facility] and referral has already been done and pt can call pulmonology in [place]  [phone] or [place] [phone]. I  Was unable to speak with pt and left v/m for pt to cb. FYI to [facility] CMA and [place] LPN.

## 2019-12-09 NOTE — Telephone Encounter (Signed)
Noted  

## 2019-12-16 ENCOUNTER — Other Ambulatory Visit: Payer: Self-pay | Admitting: Family Medicine

## 2019-12-18 ENCOUNTER — Institutional Professional Consult (permissible substitution): Payer: Non-veteran care | Admitting: Internal Medicine

## 2019-12-26 ENCOUNTER — Ambulatory Visit (INDEPENDENT_AMBULATORY_CARE_PROVIDER_SITE_OTHER): Payer: 59 | Admitting: Internal Medicine

## 2019-12-26 ENCOUNTER — Other Ambulatory Visit: Payer: Self-pay

## 2019-12-26 ENCOUNTER — Encounter: Payer: Self-pay | Admitting: Internal Medicine

## 2019-12-26 VITALS — BP 138/82 | HR 86 | Temp 98.4°F | Ht 72.44 in | Wt 296.4 lb

## 2019-12-26 DIAGNOSIS — J1282 Pneumonia due to coronavirus disease 2019: Secondary | ICD-10-CM

## 2019-12-26 DIAGNOSIS — G4733 Obstructive sleep apnea (adult) (pediatric): Secondary | ICD-10-CM

## 2019-12-26 DIAGNOSIS — R0683 Snoring: Secondary | ICD-10-CM | POA: Diagnosis not present

## 2019-12-26 DIAGNOSIS — U071 COVID-19: Secondary | ICD-10-CM

## 2019-12-26 NOTE — Progress Notes (Signed)
12/26/19- 7 yoM never smoker for sleep evaluation courtesy of Dr Danise Mina. Medical problem list includes R DVT, HTN, Covid pneumonia, DM2/ neuropathy, Dyslipidemia, Sciatica, Morbid Obesity,  Epworth score- "0" Body weight today 296 lbs His wife was concerned about his snoring and that he is always tired. He says tiredness is recent and due to fact wife has been uncomfortable and sleeping poorly herself after multiple recent surgeries.  No hx ENT surgery or heart disease. Denies other parasomnias. No sleep meds and no caffeine.  Hosp in February for Covid pneumonia, complicated then by R DVT - now on Eliquis  Prior to Admission medications   Medication Sig Start Date End Date Taking? Authorizing Provider  albuterol (VENTOLIN HFA) 108 (90 Base) MCG/ACT inhaler Inhale 2 puffs into the lungs every 6 (six) hours as needed for wheezing or shortness of breath. 09/29/19  Yes Ria Bush, MD  apixaban (ELIQUIS) 5 MG TABS tablet Take 1 tablet (5 mg total) by mouth 2 (two) times daily. 11/11/19 01/25/20 Yes Ria Bush, MD  atorvastatin (LIPITOR) 40 MG tablet TAKE 1 TABLET (40 MG TOTAL) BY MOUTH DAILY AT 6 PM. 09/11/19  Yes Ria Bush, MD  clotrimazole (LOTRIMIN) 1 % cream APPLY TO AFFECTED AREA TWICE A DAY Patient taking differently: Apply 1 application topically 2 (two) times daily as needed (rash).  04/01/19  Yes Ria Bush, MD  gabapentin (NEURONTIN) 300 MG capsule Take 300 mg by mouth as needed.   Yes [provider]  glipiZIDE (GLUCOTROL) 10 MG tablet Take 1 tablet (10 mg total) by mouth 2 (two) times daily before lunch and supper. 08/21/19  Yes Ria Bush, MD  glucose blood (ONE TOUCH ULTRA TEST) test strip Check blood sugar twice a day and as instructed. Dx 250.00 03/20/19  Yes Ria Bush, MD  JARDIANCE 25 MG TABS tablet TAKE 1 TABLET BY MOUTH EVERY DAY Patient taking differently: Take 25 mg by mouth every morning.  07/16/19  Yes Ria Bush, MD   metFORMIN (GLUCOPHAGE) 500 MG tablet Take 2 tablets (1,000 mg total) by mouth 2 (two) times daily with a meal. 09/02/19  Yes Nita Sells, MD  pantoprazole (PROTONIX) 20 MG tablet Take 1 tablet (20 mg total) by mouth daily. 08/23/19  Yes Antonietta Breach, PA-C  sildenafil (VIAGRA) 100 MG tablet TAKE 0.5-1 TABLETS (50-100 MG TOTAL) BY MOUTH DAILY AS NEEDED FOR ERECTILE DYSFUNCTION. 11/17/19  Yes Ria Bush, MD   Past Medical History:  Diagnosis Date  . ED (erectile dysfunction) of organic origin   . Elevated PSA 2012   with L lobe induration on DRE, normal biopsy 2012 ([place])  . Family history of prostate cancer    father, brother  . Genital herpes   . History of chronic prostatitis 2012   granulomatous, CXR done - negative  . History of hypertension   . HLD (hyperlipidemia)   . Morbid obesity ([place])   . Pneumonia due to COVID-19 virus 08/2019  . Seasonal allergies   . T2DM (type 2 diabetes mellitus) ([place]) 2010   lantus started by [facility]   Past Surgical History:  Procedure Laterality Date  . COLONOSCOPY  02/14/2018   at [facility] per patient - told normal  . ETT  2012   WNL ([facility])  . I & D EXTREMITY Right 12/03/2012   Procedure: IRRIGATION AND DEBRIDEMENT RIGHT HAND ABSCESS ;  Surgeon: Tennis Must, MD;  Location: [place];  Service: Orthopedics;  Laterality: Right;  . PROSTATE BIOPSY  2012  granulomatous prostatitis   Family History  Problem Relation Age of Onset  . Cancer Father 7       colon, prostate  . Hypertension Father   . Diabetes Father   . Stroke Father 70  . Coronary artery disease Father 30       MI  . Cancer Brother 49       prostate  . Diabetes Mother   . Diabetes Sister     Social History   Socioeconomic History  . Marital status: Married    Spouse name: Not on file  . Number of children: Not on file  . Years of education: Not on file  . Highest education level: Not on file  Occupational History  . Not on file  Tobacco  Use  . Smoking status: Never Smoker  . Smokeless tobacco: Never Used  Vaping Use  . Vaping Use: Never used  Substance and Sexual Activity  . Alcohol use: Yes    Comment: occasional  . Drug use: No  . Sexual activity: Not on file  Other Topics Concern  . Not on file  Social History Narrative   Widower - wife suddenly passed away 11/13/2011.   New fiancee.   Has 5 children but none live at home.   Occupation: works at [employer]    Edu: HS   Activity: walks 2 mi 1x/wk   Diet: good water (5-6 glasses), 8oz grape juice at night   Social Determinants of Corporate investment banker Strain:   . Difficulty of Paying Living Expenses:   Food Insecurity:   . Worried About Programme researcher, broadcasting/film/video in the Last Year:   . Barista in the Last Year:   Transportation Needs:   . Freight forwarder (Medical):   Marland Kitchen Lack of Transportation (Non-Medical):   Physical Activity:   . Days of Exercise per Week:   . Minutes of Exercise per Session:   Stress:   . Feeling of Stress :   Social Connections:   . Frequency of Communication with Friends and Family:   . Frequency of Social Gatherings with Friends and Family:   . Attends Religious Services:   . Active Member of Clubs or Organizations:   . Attends Banker Meetings:   Marland Kitchen Marital Status:   Intimate Partner Violence:   . Fear of Current or Ex-Partner:   . Emotionally Abused:   Marland Kitchen Physically Abused:   . Sexually Abused:    ROS-see HPI   + = positive Constitutional:    weight loss, night sweats, fevers, chills, fatigue, lassitude. HEENT:    headaches, difficulty swallowing, tooth/dental problems, sore throat,       sneezing, itching, ear ache, nasal congestion, post nasal drip, snoring CV:    chest pain, orthopnea, PND, +swelling in lower extremities, anasarca,                                  dizziness, palpitations Resp:   shortness of breath with exertion or at rest.                productive cough,   non-productive cough,  coughing up of blood.              change in color of mucus.  wheezing.   Skin:    rash or lesions. GI:  No-   heartburn, indigestion, abdominal pain, nausea, vomiting, diarrhea,  change in bowel habits, loss of appetite GU: dysuria, change in color of urine, no urgency or frequency.   flank pain. MS:   joint pain, stiffness, decreased range of motion, back pain. Neuro-     nothing unusual Psych:  change in mood or affect.  depression or anxiety.   memory loss.  OBJ- Physical Exam General- Alert, Oriented, Affect-appropriate, Distress- none acute, + obese Skin- rash-none, lesions- none, excoriation- none Lymphadenopathy- none Head- atraumatic            Eyes- Gross vision intact, PERRLA, conjunctivae and secretions clear            Ears- Hearing, canals-normal            Nose- Clear, no-Septal dev, mucus, polyps, erosion, perforation             Throat- Mallampati III , mucosa clear , drainage- none, tonsils- atrophic,  + teeth Neck- flexible , trachea midline, no stridor , thyroid nl, carotid no bruit Chest - symmetrical excursion , unlabored           Heart/CV- RRR , no murmur , no gallop  , no rub, nl s1 s2                           - JVD- none , edema- none, stasis changes- none, varices- none           Lung- clear to P&A, wheeze- none, cough- none , dullness-none, rub- none           Chest wall-  Abd-  Br/ Gen/ Rectal- Not done, not indicated Extrem- cyanosis- none, clubbing, none, atrophy- none, strength- nl Neuro- grossly intact to observation

## 2019-12-26 NOTE — Assessment & Plan Note (Signed)
He reports 46 lb weight gain in 2 years, and recognizes he snores les when weight is down. Plan- Strong encouragement to normalize body weight. Diet and exercise.

## 2019-12-26 NOTE — Assessment & Plan Note (Signed)
Obstructive sleep apnea is likely. Appropriate educational discussion done, including importance of weight and responsibility for safe driving. Plan- schedule sleep study, then CPAP if appropriate

## 2019-12-26 NOTE — Assessment & Plan Note (Signed)
Clinically resolved. Still using O2 2L for sleep from hospital. His home sleep study will need to be on room air, which will allow reassessment.

## 2019-12-26 NOTE — Patient Instructions (Signed)
Order- please schedule home sleep test   Dx OSA   Please call us for results and recommendations, about 2 weeks after your sleep test. If appropriate, we may be able to start treatment before we see you next.

## 2020-01-05 ENCOUNTER — Other Ambulatory Visit: Payer: Self-pay | Admitting: Family Medicine

## 2020-01-10 ENCOUNTER — Other Ambulatory Visit: Payer: Self-pay | Admitting: Family Medicine

## 2020-01-13 ENCOUNTER — Telehealth: Payer: Self-pay

## 2020-01-13 MED ORDER — TADALAFIL 20 MG PO TABS: 10.0000 mg | ORAL_TABLET | ORAL | 11 refills | Status: DC | PRN

## 2020-01-13 NOTE — Telephone Encounter (Signed)
I've sent in cialis in interim.

## 2020-01-13 NOTE — Addendum Note (Signed)
Addended by: [name] on: 01/13/2020 05:44 PM   Modules accepted: Orders

## 2020-01-13 NOTE — Telephone Encounter (Signed)
Received faxed message from [facility] stating the are having trouble stocking sildenafil.  Pt requested alternative such as Cialis.

## 2020-01-20 ENCOUNTER — Institutional Professional Consult (permissible substitution): Payer: Non-veteran care | Admitting: Pulmonary Disease

## 2020-01-27 ENCOUNTER — Other Ambulatory Visit: Payer: Self-pay | Admitting: Family Medicine

## 2020-01-27 NOTE — Telephone Encounter (Signed)
Eliquis Last filled:  01/03/20, #60 Last OV:  11/11/19, pneumonia/COVID f/u Next OV:  none

## 2020-02-03 MED ORDER — APIXABAN 5 MG PO TABS: 5.0000 mg | ORAL_TABLET | Freq: Two times a day (BID) | ORAL | 0 refills | Status: DC

## 2020-02-03 NOTE — Telephone Encounter (Signed)
Lvm, per pt verbal permission, relaying Dr. Timoteo Expose message and instructions.  Also, mailed a letter containing same info.

## 2020-02-03 NOTE — Telephone Encounter (Signed)
Please emphasize need to complete 3 FULL MONTHS of eliquis. This is second time he has not taken correctly and finished too soon. This was clearly spelled out in his patient instructions and we extensively went over it at last office visit.  He needs 3 months of twice daily eliquis to treat leg clot.  Please send letter saying the same.  I have sent a new eliquis 3 month supply - recommend he fill all 3 months and take twice daily until he runs out of medication.

## 2020-02-03 NOTE — Telephone Encounter (Addendum)
Spoke with pt relaying Dr. Timoteo Expose message.  Pt states he only did 1 month.  Did not realize he needed to call and have rx filled the following 2 mos.  States he is having lower leg swelling.  Plz advise.  (Pt gives permission to lvm with Dr. Timoteo Expose response.)

## 2020-02-03 NOTE — Addendum Note (Signed)
Addended by: [name] on: 02/03/2020 02:35 PM   Modules accepted: Orders

## 2020-02-03 NOTE — Telephone Encounter (Signed)
Should have completed 3 months of eliquis 5mg  BID treatment - will deny request. Please verify with patient that he was compliant with 3 months of eliquis 5mg  BID. If so, ok to stop eliquis at this time, monitor for new or worsening leg swelling or pain.

## 2020-02-10 ENCOUNTER — Ambulatory Visit: Payer: 59

## 2020-02-10 ENCOUNTER — Other Ambulatory Visit: Payer: Self-pay

## 2020-02-10 DIAGNOSIS — G4733 Obstructive sleep apnea (adult) (pediatric): Secondary | ICD-10-CM

## 2020-02-16 DIAGNOSIS — G4733 Obstructive sleep apnea (adult) (pediatric): Secondary | ICD-10-CM

## 2020-02-26 ENCOUNTER — Other Ambulatory Visit: Payer: Self-pay | Admitting: Family Medicine

## 2020-03-03 ENCOUNTER — Other Ambulatory Visit: Payer: Self-pay | Admitting: Family Medicine

## 2020-03-04 NOTE — Telephone Encounter (Signed)
Spoke with pt asking if he was in need of refills at this time.  Says he submitted refill request accidentally.  He will contact the pharmacy later for refill.  Request denied.

## 2020-03-10 ENCOUNTER — Ambulatory Visit (INDEPENDENT_AMBULATORY_CARE_PROVIDER_SITE_OTHER): Payer: 59 | Admitting: Family Medicine

## 2020-03-10 ENCOUNTER — Other Ambulatory Visit: Payer: Self-pay

## 2020-03-10 ENCOUNTER — Encounter: Payer: Self-pay | Admitting: Family Medicine

## 2020-03-10 VITALS — BP 120/84 | HR 111 | Temp 98.0°F | Ht 73.0 in | Wt 293.2 lb

## 2020-03-10 DIAGNOSIS — E1165 Type 2 diabetes mellitus with hyperglycemia: Secondary | ICD-10-CM | POA: Diagnosis not present

## 2020-03-10 DIAGNOSIS — M25562 Pain in left knee: Secondary | ICD-10-CM

## 2020-03-10 DIAGNOSIS — E785 Hyperlipidemia, unspecified: Secondary | ICD-10-CM

## 2020-03-10 DIAGNOSIS — IMO0002 Reserved for concepts with insufficient information to code with codable children: Secondary | ICD-10-CM

## 2020-03-10 DIAGNOSIS — E1169 Type 2 diabetes mellitus with other specified complication: Secondary | ICD-10-CM

## 2020-03-10 DIAGNOSIS — E1140 Type 2 diabetes mellitus with diabetic neuropathy, unspecified: Secondary | ICD-10-CM | POA: Diagnosis not present

## 2020-03-10 DIAGNOSIS — E118 Type 2 diabetes mellitus with unspecified complications: Secondary | ICD-10-CM | POA: Diagnosis not present

## 2020-03-10 DIAGNOSIS — G8929 Other chronic pain: Secondary | ICD-10-CM

## 2020-03-10 DIAGNOSIS — I82441 Acute embolism and thrombosis of right tibial vein: Secondary | ICD-10-CM

## 2020-03-10 DIAGNOSIS — R000 Tachycardia, unspecified: Secondary | ICD-10-CM

## 2020-03-10 LAB — POCT GLYCOSYLATED HEMOGLOBIN (HGB A1C): Hemoglobin A1C: 7.8 % — AB (ref 4.0–5.6)

## 2020-03-10 MED ORDER — EMPAGLIFLOZIN 25 MG PO TABS: 25.0000 mg | ORAL_TABLET | Freq: Every day | ORAL | 6 refills | Status: DC

## 2020-03-10 MED ORDER — GLIPIZIDE 10 MG PO TABS: 10.0000 mg | ORAL_TABLET | Freq: Two times a day (BID) | ORAL | 1 refills | Status: DC

## 2020-03-10 NOTE — Patient Instructions (Addendum)
I will review records - bring me knee imaging studies done at the [place] (ideally MRI) and latest [facility] evaluation for [facility] knee and I can work on [employer] form.  A1c today  Continue current medicines. Return in 3 months for physical.

## 2020-03-10 NOTE — Progress Notes (Signed)
This visit was conducted in person.  BP 120/84 (BP Location: Left Arm, Patient Position: Sitting, Cuff Size: Large)   Pulse (!) 111   Temp 98 F (36.7 C) (Temporal)   Ht 6\' 1"  (1.854 m)   Wt 293 lb 3 oz (133 kg)   SpO2 99%   BMI 38.68 kg/m   Pulse Readings from Last 3 Encounters:  03/10/20 (!) 111  12/26/19 86  11/11/19 97    CC: discuss form Subjective:    Patient ID: 11/13/19, male    DOB: 1965/12/25, 54 y.o.   MRN: [id number]  HPI: Jonathon [name] is a 54 y.o. male presenting on 03/10/2020 for Form Completion (Needs [employer] letter for left knee. )   Longstanding h/o L knee pain since he left 03/12/2020. He is at end of claim for knee. Requesting letter for [employer].   Chronic L knee pain with instability since injury while marching in [place] in [place] - stepped in a hole. Did seek care at [facility] since. Has had chronic L knee pain since then as well.   H/o COVID PNA hospitalization 08/2019 complicated by RLE DVT s/p eliquis. Unfortunately he did not take eliquis correctly first took once daily for a month then stopped, then only took twice daily for 1 month then stopped. Latest refilled 3 months of BID dosing 02/03/2020. He currently continues eliquis 5mg  bid.   Tachycardia - overall asymptomatic. Denies chest pain or dyspnea. Avoids caffeine.   DM - states good control at home - 135 in evenings, has cut out fats and increased water. Continues metformin, ozempic, jardiance and glipizide.  Lab Results  Component Value Date   HGBA1C 7.8 (A) 03/10/2020       Relevant past medical, surgical, family and social history reviewed and updated as indicated. Interim medical history since our last visit reviewed. Allergies and medications reviewed and updated. Outpatient Medications Prior to Visit  Medication Sig Dispense Refill  . albuterol (VENTOLIN HFA) 108 (90 Base) MCG/ACT inhaler Inhale 2 puffs into the lungs every 6 (six) hours as needed for wheezing or shortness of breath. 18 g 0  .  apixaban (ELIQUIS) 5 MG TABS tablet Take 1 tablet (5 mg total) by mouth 2 (two) times daily. 180 tablet 0  . atorvastatin (LIPITOR) 40 MG tablet TAKE 1 TABLET (40 MG TOTAL) BY MOUTH DAILY AT 6 PM. 90 tablet 3  . clotrimazole (LOTRIMIN) 1 % cream APPLY TO AFFECTED AREA TWICE A DAY (Patient taking differently: Apply 1 application topically 2 (two) times daily as needed (rash). ) 60 g 1  . gabapentin (NEURONTIN) 300 MG capsule Take 300 mg by mouth as needed.    glucose blood (ONE TOUCH ULTRA TEST) test strip Check blood sugar twice a day and as instructed. Dx 250.00 100 each 3  . metFORMIN (GLUCOPHAGE) 500 MG tablet Take 2 tablets (1,000 mg total) by mouth 2 (two) times daily with a meal. 180 tablet 3  . pantoprazole (PROTONIX) 20 MG tablet Take 1 tablet (20 mg total) by mouth daily. 30 tablet 1  . sildenafil (VIAGRA) 100 MG tablet TAKE 0.5-1 TABLETS (50-100 MG TOTAL) BY MOUTH DAILY AS NEEDED FOR ERECTILE DYSFUNCTION. 5 tablet 5  . tadalafil (CIALIS) 20 MG tablet Take 0.5-1 tablets (10-20 mg total) by mouth every other day as needed for erectile dysfunction. 5 tablet 11  . glipiZIDE (GLUCOTROL) 10 MG tablet TAKE 1 TABLET (10 MG TOTAL) BY MOUTH 2 (TWO) TIMES DAILY BEFORE LUNCH AND SUPPER. 180  tablet 0  . JARDIANCE 25 MG TABS tablet TAKE 1 TABLET BY MOUTH EVERY DAY (Patient taking differently: Take 25 mg by mouth every morning. ) 30 tablet 6  . Semaglutide,0.25 or 0.5MG /DOS, (OZEMPIC, 0.25 OR 0.5 MG/DOSE,) 2 MG/1.5ML SOPN Inject 0.375 mLs (0.5 mg total) into the skin once a week.     No facility-administered medications prior to visit.     Per HPI unless specifically indicated in ROS section below Review of Systems Objective:  BP 120/84 (BP Location: Left Arm, Patient Position: Sitting, Cuff Size: Large)   Pulse (!) 111   Temp 98 F (36.7 C) (Temporal)   Ht 6\' 1"  (1.854 m)   Wt 293 lb 3 oz (133 kg)   SpO2 99%   BMI 38.68 kg/m   Wt Readings from Last 3 Encounters:  03/10/20 293 lb 3 oz (133  kg)  12/26/19 296 lb 6.4 oz (134.4 kg)  11/11/19 296 lb 4 oz (134.4 kg)      Physical Exam Vitals and nursing note reviewed.  Constitutional:      Appearance: Normal appearance. He is not ill-appearing.  Cardiovascular:     Rate and Rhythm: Regular rhythm. Tachycardia present.     Pulses: Normal pulses.     Heart sounds: Normal heart sounds. No murmur heard.   Pulmonary:     Effort: Pulmonary effort is normal. No respiratory distress.     Breath sounds: Normal breath sounds. No wheezing or rhonchi.  Musculoskeletal:     Right lower leg: No edema.     Left lower leg: No edema.     Comments:  Compression stockings in place R knee WNL L knee exam: No deformity on inspection. Discomfort medial and lateral joint line No effusion/swelling noted FROM in flex/extension with crepitus and discomfort No popliteal fullness. Neg drawer test. Neg mcmurray test. No pain with valgus/varus stress. No PFgrind. No abnormal patellar mobility.   Skin:    General: Skin is warm and dry.     Findings: No rash.  Neurological:     Mental Status: He is alert.  Psychiatric:        Mood and Affect: Mood normal.        Behavior: Behavior normal.       Results for orders placed or performed in visit on 03/10/20  POCT glycosylated hemoglobin (Hb A1C)  Result Value Ref Range   Hemoglobin A1C 7.8 (A) 4.0 - 5.6 %   HbA1c POC (<> result, manual entry)     HbA1c, POC (prediabetic range)     HbA1c, POC (controlled diabetic range)     Assessment & Plan:  This visit occurred during the SARS-CoV-2 public health emergency.  Safety protocols were in place, including screening questions prior to the visit, additional usage of staff PPE, and extensive cleaning of exam room while observing appropriate contact time as indicated for disinfecting solutions.   Records he brings were reviewed - ?osgood schlatter back in 1985. Xrays were not done.  Problem List Items Addressed This Visit    Type 2 diabetes  mellitus with diabetic neuropathy, unspecified (HCC)   Relevant Medications   Semaglutide,0.25 or 0.5MG /DOS, (OZEMPIC, 0.25 OR 0.5 MG/DOSE,) 2 MG/1.5ML SOPN   empagliflozin (JARDIANCE) 25 MG TABS tablet   glipiZIDE (GLUCOTROL) 10 MG tablet   Tachycardia    ?post-covid - pt denies chest pain, dyspnea or other symptoms concerning for PE. O2 sat normal. Will monitor for now.       Severe obesity (BMI 35.0-39.9)  with comorbidity (HCC)    Encouraged ongoing efforts to affect sustainable weight loss - he is motivated to continue this       Relevant Medications   Semaglutide,0.25 or 0.5MG /DOS, (OZEMPIC, 0.25 OR 0.5 MG/DOSE,) 2 MG/1.5ML SOPN   empagliflozin (JARDIANCE) 25 MG TABS tablet   glipiZIDE (GLUCOTROL) 10 MG tablet   Dyslipidemia associated with type 2 diabetes mellitus (HCC)    Taking atorvastatin. Update FLP at upcoming CPE in 3 months. a      Relevant Medications   Semaglutide,0.25 or 0.5MG /DOS, (OZEMPIC, 0.25 OR 0.5 MG/DOSE,) 2 MG/1.5ML SOPN   empagliflozin (JARDIANCE) 25 MG TABS tablet   glipiZIDE (GLUCOTROL) 10 MG tablet   Diabetes mellitus type 2, uncontrolled, with complications (HCC)    Overdue for A1c - check today - mild drop from 8.2% to 7.8% - endorses good reading sat home and compliance with current regimen which includes metformin, ozempic, jardiance and glipizide. No med changes made today. Glipizide and jardiance refilled.       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 He requests new [employer] letter. [facility] but states a [place] doctor cannot write a [facility] letter. I have asked  him to bring me copy of latest imaging study and knee evaluation and I will work on [employer].       Acute deep vein thrombosis (DVT) of right lower extremity (HCC)    Did not take blood thinner correctly despite multiple attempts. Latest attempt I filled 3 month supply of eliquis for him to take twice daily until bottle is out. He states he continues taking eliquis BID.           Meds ordered this encounter  Medications  . empagliflozin (JARDIANCE) 25 MG TABS tablet    Sig: Take 1 tablet (25 mg total) by mouth daily.    Dispense:  30 tablet    Refill:  6  . glipiZIDE (GLUCOTROL) 10 MG tablet    Sig: Take 1 tablet (10 mg total) by mouth 2 (two) times daily before lunch and supper.    Dispense:  180 tablet    Refill:  1   Orders Placed This Encounter  Procedures  . POCT glycosylated hemoglobin (Hb A1C)    Patient Instructions  I will review records - bring me knee imaging studies done at the [place] (ideally MRI) and latest [facility] evaluation for L knee and I can work on [employer] form.  A1c today  Continue current medicines. Return in 3 months for physical.    Follow up plan: Return in about 3 months (around 06/10/2020) for annual exam, prior fasting for blood work.  [name], MD

## 2020-03-13 ENCOUNTER — Other Ambulatory Visit: Payer: Self-pay | Admitting: Family Medicine

## 2020-03-14 DIAGNOSIS — R000 Tachycardia, unspecified: Secondary | ICD-10-CM | POA: Insufficient documentation

## 2020-03-14 DIAGNOSIS — F4310 Post-traumatic stress disorder, unspecified: Secondary | ICD-10-CM | POA: Insufficient documentation

## 2020-03-14 NOTE — Assessment & Plan Note (Addendum)
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 He requests new [employer] letter. He [facility] but states a [place] doctor cannot write a [employer] letter. I have asked him to bring me copy of latest imaging study and knee evaluation and I will work on [employer].

## 2020-03-14 NOTE — Assessment & Plan Note (Addendum)
?  post-covid - pt denies chest pain, dyspnea or other symptoms concerning for PE. O2 sat normal. Will monitor for now.

## 2020-03-14 NOTE — Assessment & Plan Note (Addendum)
Overdue for A1c - check today - mild drop from 8.2% to 7.8% - endorses good reading sat home and compliance with current regimen which includes metformin, ozempic, jardiance and glipizide. No med changes made today. Glipizide and jardiance refilled.

## 2020-03-14 NOTE — Assessment & Plan Note (Signed)
Encouraged ongoing efforts to affect sustainable weight loss - he is motivated to continue this

## 2020-03-14 NOTE — Assessment & Plan Note (Signed)
Taking atorvastatin. Update FLP at upcoming CPE in 3 months. a

## 2020-03-14 NOTE — Assessment & Plan Note (Signed)
Did not take blood thinner correctly despite multiple attempts. Latest attempt I filled 3 month supply of eliquis for him to take twice daily until bottle is out. He states he continues taking eliquis BID.

## 2020-03-24 ENCOUNTER — Other Ambulatory Visit: Payer: Self-pay | Admitting: Family Medicine

## 2020-03-29 ENCOUNTER — Telehealth: Payer: Self-pay | Admitting: Family Medicine

## 2020-03-29 NOTE — Telephone Encounter (Signed)
Placed reports in Dr. Timoteo Expose box.

## 2020-03-29 NOTE — Telephone Encounter (Signed)
Patient's wife dropped off x-ray reports for Dr.G to write a letter for patient.  The reports are in the rx tower.

## 2020-04-02 ENCOUNTER — Ambulatory Visit: Payer: Non-veteran care | Admitting: Internal Medicine

## 2020-04-04 ENCOUNTER — Encounter: Payer: Self-pay | Admitting: Family Medicine

## 2020-04-05 ENCOUNTER — Telehealth: Payer: Self-pay | Admitting: *Deleted

## 2020-04-05 DIAGNOSIS — R0602 Shortness of breath: Secondary | ICD-10-CM

## 2020-04-05 DIAGNOSIS — R000 Tachycardia, unspecified: Secondary | ICD-10-CM

## 2020-04-05 NOTE — Telephone Encounter (Signed)
Would offer cardiology appointment for further evaluation of ongoing tachycardia post COVID to fully evaluate heart.

## 2020-04-05 NOTE — Telephone Encounter (Signed)
[facility] Day - Client TELEPHONE ADVICE RECORD AccessNurse Patient Name: Jonathon [name] Gender: Male DOB: May 06, 1966 Age: 54 Y 6 M 26 D Return Phone Number: [phone] (Primary), [phone] (Secondary) Address: [place] [address] Client [facility] Day - Client Client Site  [facility] - Day Physician [name] - MD Contact Type Call Who Is Calling Patient / Member / Family / Caregiver Call Type Triage / Clinical Caller Name Jonathon [name] Relationship To Patient Spouse Return Phone Number [phone] (Secondary) Chief Complaint Heart palpitations or irregular heartbeat Reason for Call Symptomatic / Request for Health Information Initial Comment Caller states that she is calling on behalf of her husband who is experiencing a rapid heart beat of 110 to 117. Translation No Nurse Assessment Nurse: Daphine Deutscher, RN, Melanie Date/Time (Eastern Time): 04/05/2020 3:05:15 PM Confirm and document reason for call. If symptomatic, describe symptoms. ---Caller states his heart rate is not getting below 110 even at rest. Had appt 3 weeks ago and noticed elevated heart rate 3 weeks ago. Has the patient had close contact with a person known or suspected to have the novel coronavirus illness OR traveled / lives in area with major community spread (including international travel) in the last 14 days from the onset of symptoms? * If Asymptomatic, screen for exposure and travel within the last 14 days. ---No Does the patient have any new or worsening symptoms? ---Yes Will a triage be completed? ---Yes Related visit to physician within the last 2 weeks? ---No Does the PT have any chronic conditions? (i.e. diabetes, asthma, this includes High risk factors for pregnancy, etc.) ---Yes List chronic conditions. ---diabetes Is this a behavioral health or substance abuse call? ---No Guidelines Guideline Title  Affirmed Question Affirmed Notes Nurse Date/Time (Eastern Time) Heart Rate and Heartbeat Questions Problems with anxiety or stress Maralyn Sago 04/05/2020 3:06:55 PM Disp. Time Lamount Cohen Time) Disposition Final User PLEASE NOTE: All timestamps contained within this report are represented as [place] Standard Time. CONFIDENTIALTY NOTICE: This fax transmission is intended only for the addressee. It contains information that is legally privileged, confidential or otherwise protected from use or disclosure. If you are not the intended recipient, you are strictly prohibited from reviewing, disclosing, copying using or disseminating any of this information or taking any action in reliance on or regarding this information. If you have received this fax in error, please notify us immediately by telephone so that we can arrange for its return to [place]. Phone: [phone], Toll-Free: [phone], Fax: [phone] Page: 2 of 2 Call Id: [id number] 04/05/2020 3:12:56 PM See PCP within 2 Weeks Yes Daphine Deutscher, RN, Mittie Bodo Disagree/Comply Comply Caller Understands Yes PreDisposition Call Doctor Care Advice Given Per Guideline SEE PCP WITHIN 2 WEEKS: * You need to be seen for this ongoing problem within the next 2 weeks. * PCP VISIT: Call your doctor (or NP/PA) during regular office hours and make an appointment. LIMIT ALCOHOL: * Limit your alcohol consumption to no more than 2 drinks a day. CALL BACK IF: * Chest pain, lightheadedness or difficulty breathing occurs * Heart beating over 140 beats / minute * More than 3 extra or skipped beats / minute * You become worse CARE ADVICE given per Heart Rate and Heartbeat Questions (Adult) guideline. Referrals REFERRED TO PCP OFFICE

## 2020-04-05 NOTE — Telephone Encounter (Signed)
Patient's wife left a voicemail stating that she has concerns about her [name] because Jonathon heart rate is staying elevated since Jonathon last visit with Dr. Sharen Hones. Jonathon [name] stated her [name]'s heart rate Saturday was 117. Jonathon [name] stated that she is concerned that he is having issues because of him previously having covid. Called patient's wife back and she got him on the phone. Patient stated that Jonathon heart rate the last couple of weeks has been running 110-117. Patient denies any chest pain or SOB. Patient has an appointment scheduled tomorrow with a pulmonologist. Patient stated that he had talked with access nurse earlier and was advised to scheduled an appointment with Jonathon PCP within the next couple of weeks. Patient stated that he thought that he had scheduled one for 04/14/20 no record of the appointment. Patient was advised that a message will go back to Dr. Sharen Hones to find out how soon he wants to see him.

## 2020-04-06 ENCOUNTER — Ambulatory Visit (INDEPENDENT_AMBULATORY_CARE_PROVIDER_SITE_OTHER): Payer: 59 | Admitting: Internal Medicine

## 2020-04-06 ENCOUNTER — Other Ambulatory Visit: Payer: Self-pay

## 2020-04-06 ENCOUNTER — Encounter: Payer: Self-pay | Admitting: Internal Medicine

## 2020-04-06 VITALS — BP 114/72 | HR 115 | Temp 97.6°F | Ht 73.0 in | Wt 294.4 lb

## 2020-04-06 DIAGNOSIS — G4733 Obstructive sleep apnea (adult) (pediatric): Secondary | ICD-10-CM

## 2020-04-06 DIAGNOSIS — G4731 Primary central sleep apnea: Secondary | ICD-10-CM | POA: Diagnosis not present

## 2020-04-06 NOTE — Progress Notes (Signed)
12/26/19- 54 yoM never smoker for sleep evaluation courtesy of Dr Sharen Hones. Medical problem list includes R DVT, HTN, Covid pneumonia, DM2/ neuropathy, Dyslipidemia, Sciatica, Morbid Obesity,  Epworth score- "0" Body weight today 296 lbs His wife was concerned about his snoring and that he is always tired. He says tiredness is recent and due to fact wife has been uncomfortable and sleeping poorly herself after multiple recent surgeries.  No hx ENT surgery or heart disease. Denies other parasomnias. No sleep meds and no caffeine.  Hosp in February for Covid pneumonia, complicated then by R DVT - now on Eliquis  04/06/20- 54 yoM never smoker followed for OSA, complicated by R DVT, HTN, Covid pneumonia Feb 2021, DM2/ neuropathy, Dyslipidemia, Sciatica, Morbid Obesity, HST 02/11/20- mild complex(central and obstructive) sleep apnea, AHI 11.9/ hr, desaturation to 80%, body weight 296 lbs Body weight today- 294 lbs Covid vax- 2 Phizer Flu vax- declines ------follow up from home sleep study, elevated heart rate ( PCP) We reviewed his sleep study and discussed "complex", making the point that we will treat for the obstructive component. We reviewed treatment choices for mild apnea, including weight loss. He was inclined to start with an oral appliance but his wife pressed him to go with CPAP. We agreed to start both directions at once so he can talk with respective providers.  ROS-see HPI   + = positive Constitutional:    weight loss, night sweats, fevers, chills, fatigue, lassitude. HEENT:    headaches, difficulty swallowing, tooth/dental problems, sore throat,       sneezing, itching, ear ache, nasal congestion, post nasal drip, snoring CV:    chest pain, orthopnea, PND, +swelling in lower extremities, anasarca,                                  dizziness, palpitations Resp:   shortness of breath with exertion or at rest.                productive cough,   non-productive cough, coughing up of blood.               change in color of mucus.  wheezing.   Skin:    rash or lesions. GI:  No-   heartburn, indigestion, abdominal pain, nausea, vomiting, diarrhea,                 change in bowel habits, loss of appetite GU: dysuria, change in color of urine, no urgency or frequency.   flank pain. MS:   joint pain, stiffness, decreased range of motion, back pain. Neuro-     nothing unusual Psych:  change in mood or affect.  depression or anxiety.   memory loss.  OBJ- Physical Exam General- Alert, Oriented, Affect-appropriate, Distress- none acute, + obese Skin- rash-none, lesions- none, excoriation- none Lymphadenopathy- none Head- atraumatic            Eyes- Gross vision intact, PERRLA, conjunctivae and secretions clear            Ears- Hearing, canals-normal            Nose- Clear, no-Septal dev, mucus, polyps, erosion, perforation             Throat- Mallampati III , mucosa clear , drainage- none, tonsils- atrophic,  + teeth Neck- flexible , trachea midline, no stridor , thyroid nl, carotid no bruit Chest - symmetrical excursion , unlabored  Heart/CV- RRR , no murmur , no gallop  , no rub, nl s1 s2                           - JVD- none , edema- none, stasis changes- none, varices- none           Lung- clear to P&A, wheeze- none, cough- none , dullness-none, rub- none           Chest wall-  Abd-  Br/ Gen/ Rectal- Not done, not indicated Extrem- cyanosis- none, clubbing, none, atrophy- none, strength- nl Neuro- grossly intact to observation

## 2020-04-06 NOTE — Patient Instructions (Signed)
Order- new DME, new CPAP auto 5-20, mask of choice, humidifier, supplies, AirView/ card  Order- referral to Orthodontist Dr Althea Grimmer   Consider oral appliance for OSA  Please call if we can help

## 2020-04-06 NOTE — Assessment & Plan Note (Signed)
Encourage mind set to change lifestyle for long term weight loss. This would help several of his medical problems.

## 2020-04-06 NOTE — Telephone Encounter (Signed)
Lvm asking pt to call back.  Need to relay Dr. G's message.  

## 2020-04-06 NOTE — Assessment & Plan Note (Signed)
We will focus on the obstructive component. Weight loss encouraged. Either CPAP or an oral appliance may be effective. Wife wants him to start with CPAP, he favors OAD.  Plan- Refer to DME to start CPAP. Also refer for oral appliance. He can choose his path as he learns more from the providers.

## 2020-04-07 NOTE — Telephone Encounter (Signed)
Spoke with pt relaying Dr. Timoteo Expose message.  Pt agrees to cards referral.

## 2020-04-16 NOTE — Addendum Note (Signed)
Addended by: [name] on: 04/16/2020 01:46 PM   Modules accepted: Orders

## 2020-04-16 NOTE — Telephone Encounter (Signed)
Referral placed.

## 2020-04-18 ENCOUNTER — Other Ambulatory Visit: Payer: Self-pay | Admitting: Family Medicine

## 2020-04-29 ENCOUNTER — Ambulatory Visit (INDEPENDENT_AMBULATORY_CARE_PROVIDER_SITE_OTHER): Payer: 59

## 2020-04-29 ENCOUNTER — Other Ambulatory Visit: Payer: Self-pay

## 2020-04-29 ENCOUNTER — Encounter: Payer: Self-pay | Admitting: Cardiovascular Disease

## 2020-04-29 ENCOUNTER — Ambulatory Visit (INDEPENDENT_AMBULATORY_CARE_PROVIDER_SITE_OTHER): Payer: 59 | Admitting: Cardiovascular Disease

## 2020-04-29 VITALS — BP 120/80 | HR 116 | Ht 73.0 in | Wt 297.5 lb

## 2020-04-29 DIAGNOSIS — R0602 Shortness of breath: Secondary | ICD-10-CM | POA: Diagnosis not present

## 2020-04-29 DIAGNOSIS — R000 Tachycardia, unspecified: Secondary | ICD-10-CM

## 2020-04-29 DIAGNOSIS — R9431 Abnormal electrocardiogram [ECG] [EKG]: Secondary | ICD-10-CM | POA: Diagnosis not present

## 2020-04-29 NOTE — Patient Instructions (Signed)
Medication Instructions:  - Your physician recommends that you continue on your current medications as directed. Please refer to the Current Medication list given to you today.  *If you need a refill on your cardiac medications before your next appointment, please call your pharmacy*   Lab Work: - Your physician recommends that you have lab work today: CBC/ BMP/ TSH  If you have labs (blood work) drawn today and your tests are completely normal, you will receive your results only by: Marland Kitchen MyChart Message (if you have MyChart) OR . A paper copy in the mail If you have any lab test that is abnormal or we need to change your treatment, we will call you to review the results.   Testing/Procedures:  1) Echocardiogram:  - Your physician has requested that you have an echocardiogram. Echocardiography is a painless test that uses sound waves to create images of your heart. It provides your doctor with information about the size and shape of your heart and how well your heart's chambers and valves are working. This procedure takes approximately one hour. There are no restrictions for this procedure.There is a possibility that an IV may need to be started during your test to inject an image enhancing agent. This is done to obtain more optimal pictures of your heart. Therefore we ask that you do at least drink some water prior to coming in to hydrate your veins.     2) Heart Monitor  - Your physician has recommended that you wear a 1 week Zio XT (heart) monitor- placed in office today. This monitor is a medical device that records the heart's electrical activity. Doctors most often use these monitors to diagnose arrhythmias. Arrhythmias are problems with the speed or rhythm of the heartbeat. The monitor is a small device applied to your chest. You can wear one while you do your normal daily activities. While wearing this monitor if you have any symptoms to push the button and record what you felt. Once  you have worn this monitor for the period of time provider prescribed (Usually 14 days), you will return the monitor device in the postage paid box. Once it is returned they will download the data collected and provide [place] with a report which the provider will then review and we will call you with those results. Important tips:  1. Avoid showering during the first 24 hours of wearing the monitor. 2. Avoid excessive sweating to help maximize wear time. 3. Do not submerge the device, no hot tubs, and no swimming pools. 4. Keep any lotions or oils away from the patch. 5. After 24 hours you may shower with the patch on. Take brief showers with your back facing the shower head.  6. Do not remove patch once it has been placed because that will interrupt data and decrease adhesive wear time. 7. Push the button when you have any symptoms and write down what you were feeling. 8. Once you have completed wearing your monitor, remove and place into box which has postage paid and place in your outgoing mailbox.  9. If for some reason you have misplaced your box then call our office and we can provide another box and/or mail it off for you.       Follow-Up: At [facility] you and your health needs are our priority.  As part of our continuing mission to provide you with exceptional heart care, we have created designated Provider Care Teams.  These Care Teams include your primary Cardiologist (  physician) and Advanced Practice Providers (APPs -  Physician Assistants and Nurse Practitioners) who all work together to provide you with the care you need, when you need it.  We recommend signing up for the patient portal called "MyChart".  Sign up information is provided on this After Visit Summary.  MyChart is used to connect with patients for Virtual Visits (Telemedicine).  Patients are able to view lab/test results, encounter notes, upcoming appointments, etc.  Non-urgent messages can be sent to your provider as  well.   To learn more about what you can do with MyChart, go to [email or web address].    Your next appointment:   1 month(s)  The format for your next appointment:   In Person  Provider:   You may see Lorine Bears, MD or one of the following Advanced Practice Providers on your designated Care Team:    Nicolasa Ducking, NP  Eula Listen, PA-C  Marisue Ivan, PA-C  Cadence Fransico Michael, [place]    Other Instructions   Echocardiogram An echocardiogram is a procedure that uses painless sound waves (ultrasound) to produce an image of the heart. Images from an echocardiogram can provide important information about:  Signs of coronary artery disease (CAD).  Aneurysm detection. An aneurysm is a weak or damaged part of an artery wall that bulges out from the normal force of blood pumping through the body.  Heart size and shape. Changes in the size or shape of the heart can be associated with certain conditions, including heart failure, aneurysm, and CAD.  Heart muscle function.  Heart valve function.  Signs of a past heart attack.  Fluid buildup around the heart.  Thickening of the heart muscle.  A tumor or infectious growth around the heart valves. Tell a health care provider about:  Any allergies you have.  All medicines you are taking, including vitamins, herbs, eye drops, creams, and over-the-counter medicines.  Any blood disorders you have.  Any surgeries you have had.  Any medical conditions you have.  Whether you are pregnant or may be pregnant. What are the risks? Generally, this is a safe procedure. However, problems may occur, including:  Allergic reaction to dye (contrast) that may be used during the procedure. What happens before the procedure? No specific preparation is needed. You may eat and drink normally. What happens during the procedure?   An IV tube may be inserted into one of your veins.  You may receive contrast through this tube. A  contrast is an injection that improves the quality of the pictures from your heart.  A gel will be applied to your chest.  A wand-like tool (transducer) will be moved over your chest. The gel will help to transmit the sound waves from the transducer.  The sound waves will harmlessly bounce off of your heart to allow the heart images to be captured in real-time motion. The images will be recorded on a computer. The procedure may vary among health care providers and hospitals. What happens after the procedure?  You may return to your normal, everyday life, including diet, activities, and medicines, unless your health care provider tells you not to do that. Summary  An echocardiogram is a procedure that uses painless sound waves (ultrasound) to produce an image of the heart.  Images from an echocardiogram can provide important information about the size and shape of your heart, heart muscle function, heart valve function, and fluid buildup around your heart.  You do not need to do anything to prepare before this  procedure. You may eat and drink normally.  After the echocardiogram is completed, you may return to your normal, everyday life, unless your health care provider tells you not to do that. This information is not intended to replace advice given to you by your health care provider. Make sure you discuss any questions you have with your health care provider. Document Revised: 10/24/2018 Document Reviewed: 08/05/2016 Elsevier Patient Education  2020 [employer].

## 2020-04-29 NOTE — Progress Notes (Signed)
Cardiology Office Note   Date:  04/29/2020   ID:  Jonathon [name], DOB 02-26-1966, MRN [id number]  PCP:  [name], MD  Cardiologist:   Lorine Bears, MD   Chief Complaint  Patient presents with  . New Patient (Initial Visit)    Ref by Dr. Sharen Hones for tachycardia. Meds reviewed by the pt. verbally. Pt. c/o rapid heart beats for the past two weeks.        History of Present Illness: Jonathon [name] is a 54 y.o. male who was referred by Dr. Sharen Hones for evaluation of tachycardia.  The patient has no prior cardiac history.  He has prolonged history of type 2 diabetes, recently diagnosed sleep apnea currently not on CPAP, erectile dysfunction, obesity and hyperlipidemia.  The patient had COVID-19 pneumonia in February of this year and was hospitalized for 8 days.  He did not require a ventilator but was on high-dose oxygen.  He had right lower extremity DVT and was anticoagulated but he did not take anticoagulation after hospital discharge and then resumed after he was prescribed Eliquis by Dr. Sharen Hones.  He stopped anticoagulation about 1 month ago.  He has been noted to be intermittently tachycardic since his hospitalization.  He reports that his heart rate is frequently above 100.  Most of the time he does not feel palpitations.  He has some exertional dyspnea but reports that his symptoms improved.  He denies chest pain.  He was recently diagnosed with mild sleep apnea but has not started CPAP yet.  He is a lifelong non-smoker.  He has no strong family history of coronary artery disease.  His father had myocardial infarction but he was 56 years old.    Past Medical History:  Diagnosis Date  . ED (erectile dysfunction) of organic origin   . Elevated PSA 2012   with L lobe induration on DRE, normal biopsy 2012 ([facility])  . Family history of prostate cancer    father, brother  . Genital herpes   . History of chronic prostatitis 2012   granulomatous, CXR done - negative  . History  of hypertension   . HLD (hyperlipidemia)   . Morbid obesity (HCC)   . Pneumonia due to COVID-19 virus 08/2019  . Seasonal allergies   . T2DM (type 2 diabetes mellitus) (HCC) 2010   lantus [facility] by [facility]    Past Surgical History:  Procedure Laterality Date  . COLONOSCOPY  02/14/2018   at [facility] per patient - told normal  . ETT  2012   WNL ([facility])  . I & D EXTREMITY Right 12/03/2012   Procedure: IRRIGATION AND DEBRIDEMENT RIGHT HAND ABSCESS ;  Surgeon: Tami Ribas, MD;  Location: [facility];  Service: Orthopedics;  Laterality: Right;  . PROSTATE BIOPSY  2012   granulomatous prostatitis     Current Outpatient Medications  Medication Sig Dispense Refill  . clotrimazole (LOTRIMIN) 1 % cream APPLY TO AFFECTED AREA TWICE A DAY (Patient taking differently: Apply 1 application topically 2 (two) times daily as needed (rash). ) 60 g 1  . empagliflozin (JARDIANCE) 25 MG TABS tablet Take 1 tablet (25 mg total) by mouth daily. 30 tablet 6  . gabapentin (NEURONTIN) 300 MG capsule Take 300 mg by mouth as needed.    Marland Kitchen glipiZIDE (GLUCOTROL) 10 MG tablet Take 1 tablet (10 mg total) by mouth 2 (two) times daily before lunch and supper. 180 tablet 1  . glucose blood (ONE TOUCH ULTRA TEST) test strip Check  blood sugar twice a day and as instructed. Dx 250.00 100 each 3  . metFORMIN (GLUCOPHAGE) 500 MG tablet Take 2 tablets (1,000 mg total) by mouth 2 (two) times daily with a meal. 180 tablet 3  . OZEMPIC, 0.25 OR 0.5 MG/DOSE, 2 MG/1.5ML SOPN INJECT 0.25 MG INTO THE SKIN ONCE A WEEK FOR 14 DAYS, THEN 0.5 MG ONCE A WEEK. 1.5 mL 3  . pantoprazole (PROTONIX) 20 MG tablet Take 1 tablet (20 mg total) by mouth daily. 30 tablet 1  . sildenafil (VIAGRA) 100 MG tablet TAKE 0.5-1 TABLETS (50-100 MG TOTAL) BY MOUTH DAILY AS NEEDED FOR ERECTILE DYSFUNCTION. 5 tablet 5  . tadalafil (CIALIS) 20 MG tablet Take 0.5-1 tablets (10-20 mg total) by mouth every other day as needed for erectile dysfunction. 5  tablet 11   No current facility-administered medications for this visit.    Allergies:   Flexeril [cyclobenzaprine]    Social History:  The patient  reports that he has never smoked. He has never used smokeless tobacco. He reports current alcohol use. He reports that he does not use drugs.   Family History:  The patient's family history includes Cancer (age of onset: 4) in his brother; Cancer (age of onset: 59) in his father; Coronary artery disease (age of onset: 28) in his father; Diabetes in his father, mother, and sister; Hypertension in his father; Stroke (age of onset: 63) in his father.    ROS:  Please see the history of present illness.   Otherwise, review of systems are positive for none.   All other systems are reviewed and negative.    PHYSICAL EXAM: VS:  BP 120/80 (BP Location: Right Arm, Patient Position: Sitting, Cuff Size: Large)   Pulse (!) 116   Ht 6\' 1"  (1.854 m)   Wt 297 lb 8 oz (134.9 kg)   SpO2 98%   BMI 39.25 kg/m  , BMI Body mass index is 39.25 kg/m. GEN: Well nourished, well developed, in no acute distress  HEENT: normal  Neck: no JVD, carotid bruits, or masses Cardiac: RRR; no murmurs, rubs, or gallops,no edema  Respiratory:  clear to auscultation bilaterally, normal work of breathing GI: soft, nontender, nondistended, + BS MS: no deformity or atrophy  Skin: warm and dry, no rash Neuro:  Strength and sensation are intact Psych: euthymic mood, full affect   EKG:  EKG is ordered today. The ekg ordered today demonstrates sinus tachycardia with possible old inferior infarct.  Inferior Q waves seem to be new compared to his prior EKGs.   Recent Labs: 08/28/2019: Magnesium 2.4 09/02/2019: ALT 54; BUN 28; Creatinine, Ser 0.97; Hemoglobin 15.9; Platelets 217; Potassium 4.2; Sodium 142    Lipid Panel    Component Value Date/Time   CHOL 251 (H) 04/18/2019 0839   CHOL 203 09/25/2011 0000   TRIG 249 (H) 08/25/2019 1519   TRIG 349 09/25/2011 0000   HDL  30.20 (L) 04/18/2019 06/18/2019   CHOLHDL 8 04/18/2019 0839   VLDL 62.6 (H) 12/20/2018 1421   LDLCALC 90 11/20/2012 1104   LDLDIRECT 77.0 04/18/2019 0839      Wt Readings from Last 3 Encounters:  04/29/20 297 lb 8 oz (134.9 kg)  04/06/20 294 lb 6.4 oz (133.5 kg)  03/10/20 293 lb 3 oz (133 kg)       PAD Screen 04/29/2020  Previous PAD dx? No  Previous surgical procedure? No  Pain with walking? No  Feet/toe relief with dangling? No  Painful, non-healing ulcers? No  Extremities discolored? No      ASSESSMENT AND PLAN:  1.  Sinus tachycardia: We have to exclude underlying metabolic abnormalities.  I requested CBC, basic metabolic profile and TSH.  Untreated sleep apnea might be contributing. There are also reported cases  of autonomic dysfunction post Covid infection.  I am going to obtain a 1 week ZIO monitor. Given complaints of shortness of breath, I am going to obtain an echocardiogram. In addition, his EKG shows possible old inferior infarct.  We will evaluate wall motion with echo and consider further ischemic cardiac evaluation.  2.  History of DVT: In the setting of COVID-19 infection.  Treated with Eliquis.  Echocardiogram will be helpful to evaluate RV function in case he had undiagnosed pulmonary embolism at the same time.    Disposition:   FU with me in 1 month  Signed,  Lorine Bears, MD  04/29/2020 2:40 PM    [facility]

## 2020-04-30 ENCOUNTER — Other Ambulatory Visit: Payer: Self-pay

## 2020-04-30 ENCOUNTER — Other Ambulatory Visit
Admission: RE | Admit: 2020-04-30 | Discharge: 2020-04-30 | Disposition: A | Discharge status: Home / Self Care | Payer: 59 | Attending: Cardiovascular Disease | Admitting: Cardiovascular Disease

## 2020-04-30 DIAGNOSIS — R0602 Shortness of breath: Secondary | ICD-10-CM | POA: Diagnosis not present

## 2020-04-30 DIAGNOSIS — R000 Tachycardia, unspecified: Secondary | ICD-10-CM

## 2020-04-30 LAB — CBC WITH DIFFERENTIAL/PLATELET
Abs Immature Granulocytes: 0.02 10*3/uL (ref 0.00–0.07)
Basophils Absolute: 0 10*3/uL (ref 0.0–0.1)
Basophils Relative: 0 %
Eosinophils Absolute: 0.1 10*3/uL (ref 0.0–0.5)
Eosinophils Relative: 1 %
HCT: 48.3 % (ref 39.0–52.0)
Hemoglobin: 16.4 g/dL (ref 13.0–17.0)
Immature Granulocytes: 0 %
Lymphocytes Relative: 38 %
Lymphs Abs: 2.6 10*3/uL (ref 0.7–4.0)
MCH: 30.9 pg (ref 26.0–34.0)
MCHC: 34 g/dL (ref 30.0–36.0)
MCV: 91 fL (ref 80.0–100.0)
Monocytes Absolute: 0.5 10*3/uL (ref 0.1–1.0)
Monocytes Relative: 8 %
Neutro Abs: 3.5 10*3/uL (ref 1.7–7.7)
Neutrophils Relative %: 53 %
Platelets: 239 10*3/uL (ref 150–400)
RBC: 5.31 MIL/uL (ref 4.22–5.81)
RDW: 14 % (ref 11.5–15.5)
WBC: 6.7 10*3/uL (ref 4.0–10.5)
nRBC: 0 % (ref 0.0–0.2)

## 2020-04-30 LAB — BASIC METABOLIC PANEL
Anion gap: 10 (ref 5–15)
BUN: 17 mg/dL (ref 6–20)
CO2: 24 mmol/L (ref 22–32)
Calcium: 9.5 mg/dL (ref 8.9–10.3)
Chloride: 103 mmol/L (ref 98–111)
Creatinine, Ser: 1.2 mg/dL (ref 0.61–1.24)
GFR, Estimated: >60 mL/min (ref >60)
Glucose, Bld: 163 mg/dL — ABNORMAL HIGH (ref 70–99)
Potassium: 4.4 mmol/L (ref 3.5–5.1)
Sodium: 137 mmol/L (ref 135–145)

## 2020-04-30 LAB — TSH: TSH: 2.346 u[IU]/mL (ref 0.350–4.500)

## 2020-05-01 ENCOUNTER — Other Ambulatory Visit: Payer: Self-pay | Admitting: Family Medicine

## 2020-05-10 ENCOUNTER — Encounter (INDEPENDENT_AMBULATORY_CARE_PROVIDER_SITE_OTHER): Payer: Self-pay

## 2020-05-20 ENCOUNTER — Other Ambulatory Visit: Payer: Self-pay

## 2020-05-20 ENCOUNTER — Ambulatory Visit (INDEPENDENT_AMBULATORY_CARE_PROVIDER_SITE_OTHER): Payer: 59

## 2020-05-20 DIAGNOSIS — R0602 Shortness of breath: Secondary | ICD-10-CM | POA: Diagnosis not present

## 2020-05-20 DIAGNOSIS — R9431 Abnormal electrocardiogram [ECG] [EKG]: Secondary | ICD-10-CM

## 2020-05-20 LAB — ECHOCARDIOGRAM COMPLETE
AR max vel: 3.39 cm2
AV Area VTI: 3.15 cm2
AV Area mean vel: 3.12 cm2
AV Mean grad: 3 mmHg
AV Peak grad: 5.7 mmHg
Ao pk vel: 1.19 m/s
Calc EF: 54.5 %
S' Lateral: 3.1 cm
Single Plane A2C EF: 51.2 %
Single Plane A4C EF: 58.2 %

## 2020-05-23 ENCOUNTER — Other Ambulatory Visit: Payer: Self-pay | Admitting: Family Medicine

## 2020-05-24 ENCOUNTER — Encounter: Payer: Self-pay | Admitting: Family Medicine

## 2020-05-24 NOTE — Telephone Encounter (Signed)
Reviewed records and summarized below: 1. L knee xray WNL (10/2015). Knee images present 2. R hip xray WNL (10/2013). Hip images present   Letter provided and in Lisa's box.

## 2020-05-24 NOTE — Telephone Encounter (Addendum)
Left message on vm per dpr notifying him the letter is ready to pick up.  [Placed letter at front office attached to [facility] folder- yellow folders.]

## 2020-05-24 NOTE — Telephone Encounter (Signed)
See phone note.  Letter written and in Lisa's box.

## 2020-05-30 NOTE — Progress Notes (Signed)
Cardiology Office Note    Date:  05/31/2020   ID:  Jonathon [name], DOB Jan 10, 1966, MRN [id number]  PCP:  [name], MD  Cardiologist:  Lorine Bears, MD  Electrophysiologist:  None   Chief Complaint: Follow up  History of Present Illness:   Jonathon [name] is a 54 y.o. male with history of COVID-19 pneumonia in 08/2019 requiring hospitalization for 8 days, DM2, recently diagnosed sleep apnea not currently on CPAP, obesity, hyperlipidemia, and erectile dysfunction who presents for follow-up of tachycardic heart rates.  He was admitted to the hospital in 08/2019 with COVID-19 pneumonia for 8 days.  He did not require ventilator though was on high-dose oxygen.  He was subsequently diagnosed with a right lower extremity DVT and was anticoagulated but did not take this after hospital discharge.  This was subsequently resumed by his PCP and discontinued in 03/2020.  He was seen by Dr. Kirke Corin as a new patient on 04/29/2020 for evaluation of tachycardic heart rates following his hospitalization for COVID-19 indicating his rates were frequently above 100 bpm.  There were no associated palpitations.  There was some exertional dyspnea though reported her symptoms had improved.  He denied any chest pain.  He underwent a 7-day Zio patch monitor which showed normal sinus rhythm with an average heart rate of 104 bpm.  There was one run of SVT which lasted 7 beats with a rate of 160 bpm along with rare PACs and PVCs.  Echo on 05/20/2020 demonstrated an EF of 55 to 60%, no regional wall motion abnormalities, mild LVH, normal RV systolic function and ventricular cavity size, and no significant valvular abnormalities.  He comes in doing reasonably well from a cardiac perspective.  He notes overall his dyspnea has improved some though he does continue to note the need to take an occasional isolated deep inhalation.  He denies any chest pain.  He remains active at baseline walking on his treadmill without any anginal  symptoms.  Given his recent issues with dyspnea and tachycardic heart rates he has been supplied a golf cart to use for prolonged trips while at work.  Last month he did have to walk all the way across campus and did well with this.  He is awaiting facemask/mouthpiece for his CPAP.    Labs independently reviewed: 04/2020 - HGB 16.4, PLT 239, potassium 4.4, BUN 17, Scr 1.20, TSH normal 02/2020 - A1c 7.8 08/2019 - albumin 3.4, ALT 54, AST normal  Past Medical History:  Diagnosis Date  . ED (erectile dysfunction) of organic origin   . Elevated PSA 2012   with L lobe induration on DRE, normal biopsy 2012 ([place])  . Family history of prostate cancer    father, brother  . Genital herpes   . History of chronic prostatitis 2012   granulomatous, CXR done - negative  . History of hypertension   . HLD (hyperlipidemia)   . Morbid obesity (HCC)   . Pneumonia due to COVID-19 virus 08/2019  . Seasonal allergies   . T2DM (type 2 diabetes mellitus) (HCC) 2010   lantus started by [facility]    Past Surgical History:  Procedure Laterality Date  . COLONOSCOPY  02/14/2018   at [facility] per patient - told normal  . ETT  2012   WNL ([facility])  . I & D EXTREMITY Right 12/03/2012   Procedure: IRRIGATION AND DEBRIDEMENT RIGHT HAND ABSCESS ;  Surgeon: Tami Ribas, MD;  Location: [facility];  Service: Orthopedics;  Laterality: Right;  .  PROSTATE BIOPSY  2012   granulomatous prostatitis    Current Medications: Current Meds  Medication Sig  . clotrimazole (LOTRIMIN) 1 % cream APPLY TO AFFECTED AREA TWICE A DAY  . empagliflozin (JARDIANCE) 25 MG TABS tablet Take 1 tablet (25 mg total) by mouth daily.  Marland Kitchen. gabapentin (NEURONTIN) 300 MG capsule Take 300 mg by mouth as needed.  Marland Kitchen. glipiZIDE (GLUCOTROL) 10 MG tablet Take 1 tablet (10 mg total) by mouth 2 (two) times daily before lunch and supper.  Marland Kitchen. glucose blood (ONE TOUCH ULTRA TEST) test strip Check blood sugar twice a day and as instructed. Dx 250.00   . metFORMIN (GLUCOPHAGE) 500 MG tablet Take 2 tablets (1,000 mg total) by mouth 2 (two) times daily with a meal.  . OZEMPIC, 0.25 OR 0.5 MG/DOSE, 2 MG/1.5ML SOPN INJECT 0.25 MG INTO THE SKIN ONCE A WEEK FOR 14 DAYS, THEN 0.5 MG ONCE A WEEK.  . pantoprazole (PROTONIX) 20 MG tablet Take 1 tablet (20 mg total) by mouth daily.  . sildenafil (VIAGRA) 100 MG tablet TAKE 0.5-1 TABLETS (50-100 MG TOTAL) BY MOUTH DAILY AS NEEDED FOR ERECTILE DYSFUNCTION.  . tadalafil (CIALIS) 20 MG tablet Take 0.5-1 tablets (10-20 mg total) by mouth every other day as needed for erectile dysfunction.    Allergies:   Flexeril [cyclobenzaprine]   Social History   Socioeconomic History  . Marital status: Married    Spouse name: Not on file  . Number of children: Not on file  . Years of education: Not on file  . Highest education level: Not on file  Occupational History  . Not on file  Tobacco Use  . Smoking status: Never Smoker  . Smokeless tobacco: Never Used  Vaping Use  . Vaping Use: Never used  Substance and Sexual Activity  . Alcohol use: Yes    Comment: occasional  . Drug use: No  . Sexual activity: Not on file  Other Topics Concern  . Not on file  Social History Narrative   Widower - wife suddenly passed away 2013.   New fiancee.   Has 5 children but none live at home.   Served in the [employer]    Occupation: works at [employer]    E[employer]: [employer]   Activity: walks 2 mi 1x/wk   Diet: good water (5-6 glasses), 8oz grape juice at night   Social Determinants of Corporate investment bankerHealth   Financial Resource Strain:   . Difficulty of Paying Living Expenses: Not on file  Food Insecurity:   . Worried About Programme researcher, broadcasting/film/videounning Out of Food in the Last Year: Not on file  . Ran Out of Food in the Last Year: Not on file  Transportation Needs:   . Lack of Transportation (Medical): Not on file  . Lack of Transportation (Non-Medical): Not on file  Physical Activity:   . Days of Exercise per Week: Not on file  . Minutes of Exercise  per Session: Not on file  Stress:   . Feeling of Stress : Not on file  Social Connections:   . Frequency of Communication with Friends and Family: Not on file  . Frequency of Social Gatherings with Friends and Family: Not on file  . Attends Religious Services: Not on file  . Active Member of Clubs or Organizations: Not on file  . Attends BankerClub or Organization Meetings: Not on file  . Marital Status: Not on file     Family History:  The patient's family history includes Cancer (age of onset: 6556)  in his brother; Cancer (age of onset: 97) in his father; Coronary artery disease (age of onset: 11) in his father; Diabetes in his father, mother, and sister; Hypertension in his father; Stroke (age of onset: 75) in his father.  ROS:   Review of Systems  Constitutional: Negative for chills, diaphoresis, fever, malaise/fatigue and weight loss.  HENT: Negative for congestion.   Eyes: Negative for discharge and redness.  Respiratory: Positive for shortness of breath. Negative for cough, sputum production and wheezing.   Cardiovascular: Negative for chest pain, palpitations, orthopnea, claudication, leg swelling and PND.  Gastrointestinal: Negative for abdominal pain, heartburn, nausea and vomiting.  Musculoskeletal: Negative for falls and myalgias.  Skin: Negative for rash.  Neurological: Negative for dizziness, tingling, tremors, sensory change, speech change, focal weakness, loss of consciousness and weakness.  Endo/Heme/Allergies: Does not bruise/bleed easily.  Psychiatric/Behavioral: Negative for substance abuse. The patient is not nervous/anxious.   All other systems reviewed and are negative.    EKGs/Labs/Other Studies Reviewed:    Studies reviewed were summarized above. The additional studies were reviewed today:  2D echo 05/20/2020: 1. Left ventricular ejection fraction, by estimation, is 55 to 60%. Left  ventricular ejection fraction by 3D volume is 58 %. The left ventricle has   normal function. The left ventricle has no regional wall motion  abnormalities. There is mild left  ventricular hypertrophy. Left ventricular diastolic function could not be  evaluated.  2. Right ventricular systolic function is normal. The right ventricular  size is normal.  3. The mitral valve is normal in structure. No evidence of mitral valve  regurgitation. No evidence of mitral stenosis.  4. The aortic valve is normal in structure. Aortic valve regurgitation is  not visualized. No aortic stenosis is present.  5. The inferior vena cava is dilated in size with <50% respiratory  variability, suggesting right atrial pressure of 15 mmHg. __________  Luci Bank 04/2020: 7-day ZIO monitor:  Normal sinus rhythm with an average heart rate of 104 bpm. 1 run of SVT which lasted 7 beats with a rate of 160 bpm. Rare PACs and rare PVCs.   EKG:  EKG is ordered today.  The EKG ordered today demonstrates sinus tachycardia, 108 bpm, first-degree AV block, prior inferior infarct, poor R wave progression along the precordial leads, when compared to prior tracing PR interval has progressed slightly otherwise there are no significant changes  Recent Labs: 08/28/2019: Magnesium 2.4 09/02/2019: ALT 54 04/30/2020: BUN 17; Creatinine, Ser 1.20; Hemoglobin 16.4; Platelets 239; Potassium 4.4; Sodium 137; TSH 2.346  Recent Lipid Panel    Component Value Date/Time   CHOL 251 (H) 04/18/2019 0839   CHOL 203 09/25/2011 0000   TRIG 249 (H) 08/25/2019 1519   TRIG 349 09/25/2011 0000   HDL 30.20 (L) 04/18/2019 0839   CHOLHDL 8 04/18/2019 0839   VLDL 62.6 (H) 12/20/2018 1421   LDLCALC 90 11/20/2012 1104   LDLDIRECT 77.0 04/18/2019 0839    PHYSICAL EXAM:    VS:  BP 130/84 (BP Location: Left Arm, Patient Position: Sitting, Cuff Size: Normal)   Pulse (!) 108   Ht 6\' 1"  (1.854 m)   Wt 299 lb (135.6 kg)   SpO2 98%   BMI 39.45 kg/m   BMI: Body mass index is 39.45 kg/m.  Physical Exam Constitutional:       Appearance: He is well-developed.  HENT:     Head: Normocephalic and atraumatic.  Eyes:     General:  Right eye: No discharge.        Left eye: No discharge.  Neck:     Vascular: No JVD.  Cardiovascular:     Rate and Rhythm: Regular rhythm. Tachycardia present.     Pulses: No midsystolic click and no opening snap.          Posterior tibial pulses are 2+ on the right side and 2+ on the left side.     Heart sounds: Normal heart sounds, S1 normal and S2 normal. Heart sounds not distant. No murmur heard.  No friction rub.  Pulmonary:     Effort: Pulmonary effort is normal. No respiratory distress.     Breath sounds: Normal breath sounds. No decreased breath sounds, wheezing or rales.  Chest:     Chest wall: No tenderness.  Abdominal:     General: There is no distension.     Palpations: Abdomen is soft.     Tenderness: There is no abdominal tenderness.  Musculoskeletal:     Cervical back: Normal range of motion.  Skin:    General: Skin is warm and dry.     Nails: There is no clubbing.  Neurological:     Mental Status: He is alert and oriented to person, place, and time.  Psychiatric:        Speech: Speech normal.        Behavior: Behavior normal.        Thought Content: Thought content normal.        Judgment: Judgment normal.     Wt Readings from Last 3 Encounters:  05/31/20 299 lb (135.6 kg)  04/29/20 297 lb 8 oz (134.9 kg)  04/06/20 294 lb 6.4 oz (133.5 kg)     ASSESSMENT & PLAN:   1. Dyspnea/abnormal EKG: Overall it appears his symptoms of dyspnea are improving with only noted isolated episodes of taking a deep inhalation.  He is able to walk on his treadmill at home for 30 minutes without symptoms suggestive of angina.  EKG continues to show evidence of possible prior inferior infarct.  In this setting we will pursue Lexiscan MPI to evaluate for high risk ischemia.  If this is unrevealing no further ischemic cardiac testing would be needed.  2. Sinus  tachycardia: Possibly in setting of some autonomic dysfunction following COVID-19 though cannot exclude physical deconditioning and obesity with underlying sleep apnea contributing.  Defer addition of AV nodal blocking medication given the above possible underlying etiologies and in the setting of him being asymptomatic as well as having a mild first-degree AV block on EKG.  Recent lab work has been unrevealing.  3. History of DVT: Status post treatment with OAC.  4. Obesity/sleep apnea: Weight loss advised.  He is awaiting facemask/mouthpiece for CPAP.  Disposition: F/u with Dr. Kirke Corin or an APP in 1 month.   Medication Adjustments/Labs and Tests Ordered: Current medicines are reviewed at length with the patient today.  Concerns regarding medicines are outlined above. Medication changes, Labs and Tests ordered today are summarized above and listed in the Patient Instructions accessible in Encounters.   Signed, Eula Listen, PA-C 05/31/2020 9:34 AM     [facility] [address] [place], [place] [address] [phone]

## 2020-05-31 ENCOUNTER — Encounter: Payer: Self-pay | Admitting: Physician Assistant

## 2020-05-31 ENCOUNTER — Other Ambulatory Visit: Payer: Self-pay

## 2020-05-31 ENCOUNTER — Ambulatory Visit (INDEPENDENT_AMBULATORY_CARE_PROVIDER_SITE_OTHER): Payer: 59 | Admitting: Physician Assistant

## 2020-05-31 VITALS — BP 130/84 | HR 108 | Ht 73.0 in | Wt 299.0 lb

## 2020-05-31 DIAGNOSIS — Z86718 Personal history of other venous thrombosis and embolism: Secondary | ICD-10-CM

## 2020-05-31 DIAGNOSIS — R9431 Abnormal electrocardiogram [ECG] [EKG]: Secondary | ICD-10-CM | POA: Diagnosis not present

## 2020-05-31 DIAGNOSIS — R0602 Shortness of breath: Secondary | ICD-10-CM | POA: Diagnosis not present

## 2020-05-31 DIAGNOSIS — E6609 Other obesity due to excess calories: Secondary | ICD-10-CM

## 2020-05-31 DIAGNOSIS — G4733 Obstructive sleep apnea (adult) (pediatric): Secondary | ICD-10-CM

## 2020-05-31 DIAGNOSIS — Z6839 Body mass index (BMI) 39.0-39.9, adult: Secondary | ICD-10-CM

## 2020-05-31 DIAGNOSIS — R000 Tachycardia, unspecified: Secondary | ICD-10-CM | POA: Diagnosis not present

## 2020-05-31 NOTE — Patient Instructions (Addendum)
Medication Instructions:  No changes  *If you need a refill on your cardiac medications before your next appointment, please call your pharmacy*   Lab Work: None ordered today  If you have labs (blood work) drawn today and your tests are completely normal, you will receive your results only by: Marland Kitchen MyChart Message (if you have MyChart) OR . A paper copy in the mail If you have any lab test that is abnormal or we need to change your treatment, we will call you to review the results.   Testing/Procedures: [facility] MYOVIEW  Your caregiver has ordered a Stress Test with nuclear imaging. The purpose of this test is to evaluate the blood supply to your heart muscle. This procedure is referred to as a "Non-Invasive Stress Test." This is because other than having an IV started in your vein, nothing is inserted or "invades" your body. Cardiac stress tests are done to find areas of poor blood flow to the heart by determining the extent of coronary artery disease (CAD). Some patients exercise on a treadmill, which naturally increases the blood flow to your heart, while others who are  unable to walk on a treadmill due to physical limitations have a pharmacologic/chemical stress agent called Lexiscan . This medicine will mimic walking on a treadmill by temporarily increasing your coronary blood flow.   Please note: these test may take anywhere between 2-4 hours to complete  PLEASE REPORT TO [facility] MEDICAL MALL ENTRANCE  THE VOLUNTEERS AT THE FIRST DESK WILL DIRECT YOU WHERE TO GO  Date of Procedure:_____________________________________  Arrival Time for Procedure:______________________________  Instructions regarding medication:   Hold diabetes medication morning of procedure: metformin  PLEASE NOTIFY THE OFFICE AT LEAST 24 HOURS IN ADVANCE IF YOU ARE UNABLE TO KEEP YOUR APPOINTMENT.  [phone] AND  PLEASE NOTIFY NUCLEAR MEDICINE AT [facility] AT LEAST 24 HOURS IN ADVANCE IF YOU ARE UNABLE TO KEEP YOUR  APPOINTMENT. [phone]  How to prepare for your Myoview test:  1. Do not eat or drink after midnight 2. No caffeine for 24 hours prior to test 3. No smoking 24 hours prior to test. 4. Your medication may be taken with water.  If your doctor stopped a medication because of this test, do not take that medication. 5. Ladies, please do not wear dresses.  Skirts or pants are appropriate. Please wear a short sleeve shirt. 6. No perfume, cologne or lotion. 7. Wear comfortable walking shoes. No heels!  Follow-Up: At [facility], you and your health needs are our priority.  As part of our continuing mission to provide you with exceptional heart care, we have created designated Provider Care Teams.  These Care Teams include your primary Cardiologist (physician) and Advanced Practice Providers (APPs -  Physician Assistants and Nurse Practitioners) who all work together to provide you with the care you need, when you need it.  We recommend signing up for the patient portal called "MyChart".  Sign up information is provided on this After Visit Summary.  MyChart is used to connect with patients for Virtual Visits (Telemedicine).  Patients are able to view lab/test results, encounter notes, upcoming appointments, etc.  Non-urgent messages can be sent to your provider as well.   To learn more about what you can do with MyChart, go to [email or web address].    Your next appointment:   1 month(s)  The format for your next appointment:   In Person  Provider:   You may see Lorine Bears, MD or one of the following  Advanced Practice Providers on your designated Care Team:    Murray Hodgkins, NP  Christell Faith, PA-C  Marrianne Mood, PA-C  Cadence Westfield, [place]  Laurann Montana, NP

## 2020-06-09 ENCOUNTER — Other Ambulatory Visit: Payer: Self-pay | Admitting: Family Medicine

## 2020-06-09 NOTE — Telephone Encounter (Signed)
E-scribed refill.  Plz schedule cpe and lab visits.  

## 2020-06-11 ENCOUNTER — Encounter
Admission: RE | Admit: 2020-06-11 | Discharge: 2020-06-11 | Disposition: A | Discharge status: Home / Self Care | Payer: 59 | Source: Ambulatory Visit | Attending: Physician Assistant | Admitting: Physician Assistant

## 2020-06-11 ENCOUNTER — Other Ambulatory Visit: Payer: Self-pay

## 2020-06-11 DIAGNOSIS — R0602 Shortness of breath: Secondary | ICD-10-CM | POA: Diagnosis not present

## 2020-06-11 LAB — NM MYOCAR MULTI W/SPECT W/WALL MOTION / EF
Estimated workload: 1 METS
Exercise duration (min): 0 min
Exercise duration (sec): 0 s
LV dias vol: 118 mL (ref 62–150)
LV sys vol: 54 mL
MPHR: 164 {beats}/min
Peak HR: 96 {beats}/min
Percent HR: 58 %
Rest HR: 77 {beats}/min
SDS: 5
SRS: 9
SSS: 8
TID: 0.97

## 2020-06-11 MED ORDER — REGADENOSON 0.4 MG/5ML IV SOLN
0.4000 mg | Freq: Once | INTRAVENOUS | Status: AC
  Administered 2020-06-11: 0.4 mg via INTRAVENOUS
  Filled 2020-06-11: qty 5

## 2020-06-11 MED ORDER — TECHNETIUM TC 99M TETROFOSMIN IV KIT
30.5000 | PACK | Freq: Once | INTRAVENOUS | Status: AC | PRN
  Administered 2020-06-11: 30.5 via INTRAVENOUS

## 2020-06-11 MED ORDER — TECHNETIUM TC 99M TETROFOSMIN IV KIT
10.3290 | PACK | Freq: Once | INTRAVENOUS | Status: AC | PRN
  Administered 2020-06-11: 10.329 via INTRAVENOUS

## 2020-06-24 NOTE — Progress Notes (Signed)
Cardiology Office Note    Date:  06/25/2020   ID:  Jonathon [name], DOB 1965-08-21, MRN [id number]  PCP:  [name], MD  Cardiologist:  Lorine Bears, MD  Electrophysiologist:  None   Chief Complaint: Follow up  History of Present Illness:   Jonathon [name] is a 54 y.o. male with history of COVID-19 pneumonia in 08/2019 requiring hospitalization for 8 days, DM2, recently diagnosed sleep apnea not currently on CPAP, obesity, hyperlipidemia, and erectile dysfunction who presents for follow-up of recent Lexiscan MPI.  He was admitted to the hospital in 08/2019 with COVID-19 pneumonia for 8 days.  He did not require ventilator though was on high-dose oxygen.  He was subsequently diagnosed with a right lower extremity DVT and was anticoagulated but did not take this after hospital discharge.  This was subsequently resumed by his PCP and discontinued in 03/2020.  He was seen by Dr. Kirke Corin as a new patient on 04/29/2020 for evaluation of tachycardic heart rates following his hospitalization for COVID-19 indicating his rates were frequently above 100 bpm.  There were no associated palpitations.  There was some exertional dyspnea though reported his symptoms had improved.  He denied any chest pain.  He underwent a 7-day Zio patch monitor which showed normal sinus rhythm with an average heart rate of 104 bpm.  There was one run of SVT which lasted 7 beats with a rate of 160 bpm along with rare PACs and PVCs.  Echo on 05/20/2020 demonstrated an EF of 55 to 60%, no regional wall motion abnormalities, mild LVH, normal RV systolic function and ventricular cavity size, and no significant valvular abnormalities.  He was seen in the office on 05/31/2020 and was doing reasonably well from a cardiac perspective.  He noted overall, his dyspnea had improved some though he did continue to feel the need to take an occasional deep inhalation.  He continued to await facemask/mouthpiece for his CPAP.  He was noted to be able  to walk on his treadmill at home for 30 minutes without symptoms of angina.  EKG continued to show evidence of possible prior inferior infarct.  In this setting, he underwent Lexiscan MPI on 06/11/2020 which showed no evidence of significant ischemia or scar with an EF of 41%, and was overall a low risk study.  No significant coronary artery calcium noted on CT corrected images.  He comes in doing very well indicating he is "great".  He notes an improvement in his occasional isolated deep inhalation though these do continue to occur.  No chest pain, palpitations, dizziness, presyncope, syncope, or lower extremity swelling.  He continues to walk on his treadmill 30 minutes 3 times per week without cardiac limitation.  He is using his golf cart more at work rather than walking though attributes this to the cold weather.  He has a planned physical with his PCP in the spring.  He does not have any issues or concerns at this time.   Labs independently reviewed: 04/2020 - HGB 16.4, PLT 239, potassium 4.4, BUN 17, Scr 1.20, TSH normal 02/2020 - A1c 7.8 08/2019 - albumin 3.4, ALT 54, AST normal  Past Medical History:  Diagnosis Date  . ED (erectile dysfunction) of organic origin   . Elevated PSA 2012   with L lobe induration on DRE, normal biopsy 2012 ([facility])  . Family history of prostate cancer    father, brother  . Genital herpes   . History of chronic prostatitis 2012   granulomatous, CXR done -  negative  . History of hypertension   . HLD (hyperlipidemia)   . Morbid obesity (HCC)   . Pneumonia due to COVID-19 virus 08/2019  . Seasonal allergies   . T2DM (type 2 diabetes mellitus) (HCC) 2010   lantus [facility] by [facility]    Past Surgical History:  Procedure Laterality Date  . [facility]  [facility]14[facility]   [facility]  . ETT  2012   WNL ([facility])  . I & D EXTREMITY Right 12/03/2012   Procedure: IRRIGATION AND DEBRIDEMENT RIGHT HAND ABSCESS ;  Surgeon: Tami RibasKevin R Kuzma, MD;   Location: [facility];  Service: Orthopedics;  Laterality: Right;  . PROSTATE BIOPSY  2012   granulomatous prostatitis    Current Medications: Current Meds  Medication Sig  . clotrimazole (LOTRIMIN) 1 % cream APPLY TO AFFECTED AREA TWICE A DAY  . empagliflozin (JARDIANCE) 25 MG TABS tablet Take 1 tablet (25 mg total) by mouth daily.  Marland Kitchen. gabapentin (NEURONTIN) 300 MG capsule Take 300 mg by mouth as needed.  Marland Kitchen. glipiZIDE (GLUCOTROL) 10 MG tablet Take 1 tablet (10 mg total) by mouth 2 (two) times daily before lunch and supper.  Marland Kitchen. glucose blood (ONE TOUCH ULTRA TEST) test strip Check blood sugar twice a day and as instructed. Dx 250.00  . metFORMIN (GLUCOPHAGE) 500 MG tablet Take 2 tablets (1,000 mg total) by mouth 2 (two) times daily with a meal.  . OZEMPIC, 0.25 OR 0.5 MG/DOSE, 2 MG/1.5ML SOPN INJECT 0.25 MG INTO THE SKIN ONCE A WEEK FOR 14 DAYS, THEN 0.5 MG ONCE A WEEK.  . pantoprazole (PROTONIX) 20 MG tablet Take 1 tablet (20 mg total) by mouth daily.  . sildenafil (VIAGRA) 100 MG tablet TAKE 0.5-1 TABLETS (50-100 MG TOTAL) BY MOUTH DAILY AS NEEDED FOR ERECTILE DYSFUNCTION.  . tadalafil (CIALIS) 20 MG tablet Take 0.5-1 tablets (10-20 mg total) by mouth every other day as needed for erectile dysfunction.    Allergies:   Flexeril [cyclobenzaprine]   Social History   Socioeconomic History  . Marital status: Married    Spouse name: Not on file  . Number of children: Not on file  . Years of education: Not on file  . Highest education level: Not on file  Occupational History  . Not on file  Tobacco Use  . Smoking status: Never Smoker  . Smokeless tobacco: Never Used  Vaping Use  . Vaping Use: Never used  Substance and Sexual Activity  . Alcohol use: Not Currently    Comment: occasional  . Drug use: No  . Sexual activity: Not on file  Other Topics Concern  . Not on file  Social History Narrative   Widower - wife suddenly passed away 2013.   New fiancee.   Has 5  children but none live at home.   Served in the [employer]    Occupation: works at [employer]    Edu: HS   Activity: walks 2 mi 1x/wk   Diet: good water (5-6 glasses), 8oz grape juice at night   Social Determinants of Corporate investment bankerHealth   Financial Resource Strain: Not on [employer]  Food Insecurity: Not on file  Transportation Needs: Not on file  Physical Activity: Not on file  Stress: Not on file  Social Connections: Not on file     Family History:  The patient's family history includes Cancer (age of onset: 6856) in his brother; Cancer (age of onset: 8974) in his father; Coronary artery disease (age  of onset: 34) in his father; Diabetes in his father, mother, and sister; Hypertension in his father; Stroke (age of onset: 42) in his father.  ROS:   Negative unless noted above.   EKGs/Labs/Other Studies Reviewed:    Studies reviewed were summarized above. The additional studies were reviewed today:  Lexiscan MPI 06/11/2020:  No T wave inversion was noted during stress.  This is a low risk study.  The left ventricular ejection fraction is mild to moderately decreased (41%).  There is no evidence for ischemia __________  2D echo 05/20/2020: 1. Left ventricular ejection fraction, by estimation, is 55 to 60%. Left  ventricular ejection fraction by 3D volume is 58 %. The left ventricle has  normal function. The left ventricle has no regional wall motion  abnormalities. There is mild left  ventricular hypertrophy. Left ventricular diastolic function could not be  evaluated.  2. Right ventricular systolic function is normal. The right ventricular  size is normal.  3. The mitral valve is normal in structure. No evidence of mitral valve  regurgitation. No evidence of mitral stenosis.  4. The aortic valve is normal in structure. Aortic valve regurgitation is  not visualized. No aortic stenosis is present.  5. The inferior vena cava is dilated in size with <50% respiratory  variability,  suggesting right atrial pressure of 15 mmHg. __________  Luci Bank 04/2020: 7-day ZIO monitor:  Normal sinus rhythm with an average heart rate of 104 bpm. 1 run of SVT which lasted 7 beats with a rate of 160 bpm. Rare PACs and rare PVCs.   EKG:  EKG is not ordered today.    Recent Labs: 08/28/2019: Magnesium 2.4 09/02/2019: ALT 54 04/30/2020: BUN 17; Creatinine, Ser 1.20; Hemoglobin 16.4; Platelets 239; Potassium 4.4; Sodium 137; TSH 2.346  Recent Lipid Panel    Component Value Date/Time   CHOL 251 (H) 04/18/2019 0839   CHOL 203 09/25/2011 0000   TRIG 249 (H) 08/25/2019 1519   TRIG 349 09/25/2011 0000   HDL 30.20 (L) 04/18/2019 0839   CHOLHDL 8 04/18/2019 0839   VLDL 62.6 (H) 12/20/2018 1421   LDLCALC 90 11/20/2012 1104   LDLDIRECT 77.0 04/18/2019 0839    PHYSICAL EXAM:    VS:  BP 118/80 (BP Location: Left Arm, Patient Position: Sitting, Cuff Size: Large)   Pulse 82   Ht 6\' 1"  (1.854 m)   Wt 297 lb (134.7 kg)   SpO2 97%   BMI 39.18 kg/m   BMI: Body mass index is 39.18 kg/m.  Physical Exam Vitals reviewed.  Constitutional:      Appearance: He is well-developed and well-nourished.  HENT:     Head: Normocephalic and atraumatic.  Eyes:     General:        Right eye: No discharge.        Left eye: No discharge.  Neck:     Vascular: No JVD.  Cardiovascular:     Rate and Rhythm: Normal rate and regular rhythm.     Pulses: No midsystolic click and no opening snap.          Posterior tibial pulses are 2+ on the right side and 2+ on the left side.     Heart sounds: Normal heart sounds, S1 normal and S2 normal. Heart sounds not distant. No murmur heard. No friction rub.  Pulmonary:     Effort: Pulmonary effort is normal. No respiratory distress.     Breath sounds: Normal breath sounds. No decreased breath sounds, wheezing or  rales.  Chest:     Chest wall: No tenderness.  Abdominal:     General: There is no distension.     Palpations: Abdomen is soft.     Tenderness:  There is no abdominal tenderness.  Musculoskeletal:        General: No edema.     Cervical back: Normal range of motion.  Skin:    General: Skin is warm and dry.     Nails: There is no clubbing or cyanosis.  Neurological:     Mental Status: He is alert and oriented to person, place, and time.  Psychiatric:        Mood and Affect: Mood and affect normal.        Speech: Speech normal.        Behavior: Behavior normal.        Thought Content: Thought content normal.        Judgment: Judgment normal.     Wt Readings from Last 3 Encounters:  06/25/20 297 lb (134.7 kg)  05/31/20 299 lb (135.6 kg)  04/29/20 297 lb 8 oz (134.9 kg)     ASSESSMENT & PLAN:   1. Dyspnea/abnormal EKG: Overall, symptoms are improved.  He does continue to note an occasional need to take a deep inhalation that dates back to his Covid diagnosis.  He did have one of these episodes while I was auscultating his heart in the setting of an extrasystole.  Cardiac workup including echo and Lexiscan MPI have been unrevealing.  Given resolved symptoms and unrevealing cardiac work-up no further testing is indicated at this time.  2. Sinus tachycardia: Heart rate is improved and normal.  Cardiac work-up unrevealing as outlined above.  No indication for medical therapy at this time.  3. History of DVT: Status post treatment with OAC.   4. Obesity/sleep apnea: Weight loss advised.  CPAP recommended.   Disposition: F/u with Dr. Kirke Corin or an APP in 12 months, if indicated.   Medication Adjustments/Labs and Tests Ordered: Current medicines are reviewed at length with the patient today.  Concerns regarding medicines are outlined above. Medication changes, Labs and Tests ordered today are summarized above and listed in the Patient Instructions accessible in Encounters.   Signed, Eula Listen, PA-C 06/25/2020 9:30 AM     [facility] [address] [place], [place] [address] [phone]

## 2020-06-25 ENCOUNTER — Other Ambulatory Visit: Payer: Self-pay

## 2020-06-25 ENCOUNTER — Ambulatory Visit (INDEPENDENT_AMBULATORY_CARE_PROVIDER_SITE_OTHER): Payer: 59 | Admitting: Physician Assistant

## 2020-06-25 ENCOUNTER — Encounter: Payer: Self-pay | Admitting: Physician Assistant

## 2020-06-25 VITALS — BP 118/80 | HR 82 | Ht 73.0 in | Wt 297.0 lb

## 2020-06-25 DIAGNOSIS — G4733 Obstructive sleep apnea (adult) (pediatric): Secondary | ICD-10-CM

## 2020-06-25 DIAGNOSIS — Z8616 Personal history of COVID-19: Secondary | ICD-10-CM

## 2020-06-25 DIAGNOSIS — Z86718 Personal history of other venous thrombosis and embolism: Secondary | ICD-10-CM | POA: Diagnosis not present

## 2020-06-25 DIAGNOSIS — R0602 Shortness of breath: Secondary | ICD-10-CM

## 2020-06-25 DIAGNOSIS — R000 Tachycardia, unspecified: Secondary | ICD-10-CM

## 2020-06-25 NOTE — Patient Instructions (Signed)
Medication Instructions:  Your physician recommends that you continue on your current medications as directed. Please refer to the Current Medication list given to you today.  *If you need a refill on your cardiac medications before your next appointment, please call your pharmacy*   Lab Work: None ordered    Testing/Procedures: None ordered   Follow-Up: At [facility], you and your health needs are our priority.  As part of our continuing mission to provide you with exceptional heart care, we have created designated Provider Care Teams.  These Care Teams include your primary Cardiologist (physician) and Advanced Practice Providers (APPs -  Physician Assistants and Nurse Practitioners) who all work together to provide you with the care you need, when you need it.  We recommend signing up for the patient portal called "MyChart".  Sign up information is provided on this After Visit Summary.  MyChart is used to connect with patients for Virtual Visits (Telemedicine).  Patients are able to view lab/test results, encounter notes, upcoming appointments, etc.  Non-urgent messages can be sent to your provider as well.   To learn more about what you can do with MyChart, go to [email or web address].    Your next appointment:   12 month(s)  The format for your next appointment:   In Person  Provider:   You may see Muhammad Arida, MD or one of the following Advanced Practice Providers on your designated Care Team:    Christopher Berge, NP  Ryan Dunn, PA-C  Jacquelyn Visser, PA-C  Cadence Furth, PA-C  Caitlin Walker, NP      

## 2020-07-05 ENCOUNTER — Other Ambulatory Visit: Payer: Self-pay | Admitting: Family Medicine

## 2020-07-06 NOTE — Progress Notes (Signed)
HPI M never smoker followed for OSA, complicated by R DVT, HTN, Covid pneumonia Feb 2021, DM2/ neuropathy, Dyslipidemia, Sciatica, Morbid Obesity, HST 02/11/20- mild complex(central and obstructive) sleep apnea, AHI 11.9/ hr, desaturation to 80%, body weight 296 lbs  ==================================================================== 04/06/20- 54 yoM never smoker followed for OSA, complicated by R DVT, HTN, Covid pneumonia Feb 2021, DM2/ neuropathy, Dyslipidemia, Sciatica, Morbid Obesity, HST 02/11/20- mild complex(central and obstructive) sleep apnea, AHI 11.9/ hr, desaturation to 80%, body weight 296 lbs Body weight today- 294 lbs Covid vax- 2 Phizer Flu vax- declines ------follow up from home sleep study, elevated heart rate ( PCP) We reviewed his sleep study and discussed "complex", making the point that we will treat for the obstructive component. We reviewed treatment choices for mild apnea, including weight loss. He was inclined to start with an oral appliance but his wife pressed him to go with CPAP. We agreed to start both directions at once so he can talk with respective providers.  07/07/20- 54 yoM never smoker followed for OSA, complicated by R DVT, HTN, Covid pneumonia Feb 2021, DM2/ neuropathy, Dyslipidemia, Sciatica, Morbid Obesity, PTSD,  CPAP referral to [facility] 5-20 on 04/06/20 Oral Appliance referral to Dr Ron Parker on 04/06/20 Download- Body weight today- 301 lbs Covid vax- 2Phizer Flu vax- declines He met with Dr Ron Parker to discuss OAP.  He hasn't been able to connect with [facility] to discuss CPAP option, but would like to. Denies changes or acute events. Understands weight loss may remove need for treatment of OSA.  ROS-see HPI   + = positive Constitutional:    weight loss, night sweats, fevers, chills, fatigue, lassitude. HEENT:    headaches, difficulty swallowing, tooth/dental problems, sore throat,       sneezing, itching, ear ache, nasal congestion, post nasal drip,  snoring CV:    chest pain, orthopnea, PND, +swelling in lower extremities, anasarca,                                  dizziness, palpitations Resp:   shortness of breath with exertion or at rest.                productive cough,   non-productive cough, coughing up of blood.              change in color of mucus.  wheezing.   Skin:    rash or lesions. GI:  No-   heartburn, indigestion, abdominal pain, nausea, vomiting, diarrhea,                 change in bowel habits, loss of appetite GU: dysuria, change in color of urine, no urgency or frequency.   flank pain. MS:   joint pain, stiffness, decreased range of motion, back pain. Neuro-     nothing unusual Psych:  change in mood or affect.  depression or anxiety.   memory loss.  OBJ- Physical Exam General- Alert, Oriented, Affect-appropriate, Distress- none acute, + obese Skin- rash-none, lesions- none, excoriation- none Lymphadenopathy- none Head- atraumatic            Eyes- Gross vision intact, PERRLA, conjunctivae and secretions clear            Ears- Hearing, canals-normal            Nose- Clear, no-Septal dev, mucus, polyps, erosion, perforation             Throat- Mallampati III , mucosa  clear , drainage- none, tonsils- atrophic,  + teeth Neck- flexible , trachea midline, no stridor , thyroid nl, carotid no bruit Chest - symmetrical excursion , unlabored           Heart/CV- RRR , no murmur , no gallop  , no rub, nl s1 s2                           - JVD- none , edema- none, stasis changes- none, varices- none           Lung- clear to P&A, wheeze- none, cough- none , dullness-none, rub- none           Chest wall-  Abd-  Br/ Gen/ Rectal- Not done, not indicated Extrem- cyanosis- none, clubbing, none, atrophy- none, strength- nl Neuro- grossly intact to observation

## 2020-07-07 ENCOUNTER — Ambulatory Visit (INDEPENDENT_AMBULATORY_CARE_PROVIDER_SITE_OTHER): Payer: 59 | Admitting: Internal Medicine

## 2020-07-07 ENCOUNTER — Other Ambulatory Visit: Payer: Self-pay

## 2020-07-07 ENCOUNTER — Encounter: Payer: Self-pay | Admitting: Internal Medicine

## 2020-07-07 DIAGNOSIS — J1282 Pneumonia due to coronavirus disease 2019: Secondary | ICD-10-CM | POA: Diagnosis not present

## 2020-07-07 DIAGNOSIS — U071 COVID-19: Secondary | ICD-10-CM | POA: Diagnosis not present

## 2020-07-07 DIAGNOSIS — G4731 Primary central sleep apnea: Secondary | ICD-10-CM

## 2020-07-07 NOTE — Patient Instructions (Signed)
Order- please contact [facility] to set up appointment to start CPAP- he wants to learn about options. See previous order for settings.     We can give Jonathon [name] the phone number for [facility] also.  Please call if we can help

## 2020-07-07 NOTE — Assessment & Plan Note (Signed)
He has mild CPAP and weight loss is long-term goal. Meanwhile he still wants to learn more about CPAP before deciding whether to go with that or an oral appliance.  Plan- we will help reach out to [facility] about CPAP.

## 2020-07-07 NOTE — Assessment & Plan Note (Signed)
Chest is now clear. He denies cough or respiratory symptoms and is clinically resolved.

## 2020-07-14 ENCOUNTER — Other Ambulatory Visit: Payer: Self-pay | Admitting: Family Medicine

## 2020-07-14 NOTE — Telephone Encounter (Signed)
Pharmacy requests refill on: Ozempic 0.25-0.5 mg  LAST REFILL: 03/25/2020  LAST OV: 03/10/2020 NEXT OV: 10/01/2020 PHARMACY: [id number] [place], [place]

## 2020-07-20 ENCOUNTER — Other Ambulatory Visit: Payer: Self-pay | Admitting: Family Medicine

## 2020-08-09 ENCOUNTER — Other Ambulatory Visit: Payer: Self-pay | Admitting: Family Medicine

## 2020-08-20 ENCOUNTER — Telehealth: Payer: Self-pay

## 2020-08-20 NOTE — Telephone Encounter (Signed)
Faxed form.  Decision pending.  

## 2020-08-20 NOTE — Telephone Encounter (Signed)
Signed and in Lisa's box.  

## 2020-08-20 NOTE — Telephone Encounter (Signed)
Received faxed PA form from [facility] for sildenafil 100 mg.  Placed form in Dr. Timoteo Expose box.

## 2020-08-21 ENCOUNTER — Other Ambulatory Visit: Payer: Self-pay | Admitting: Family Medicine

## 2020-08-23 NOTE — Telephone Encounter (Signed)
Received faxed PA approval, valid 07/21/2020- 08/20/2021.

## 2020-09-23 ENCOUNTER — Other Ambulatory Visit: Payer: Self-pay | Admitting: Family Medicine

## 2020-09-23 DIAGNOSIS — IMO0002 Reserved for concepts with insufficient information to code with codable children: Secondary | ICD-10-CM

## 2020-09-23 DIAGNOSIS — E1169 Type 2 diabetes mellitus with other specified complication: Secondary | ICD-10-CM

## 2020-09-23 DIAGNOSIS — Z8042 Family history of malignant neoplasm of prostate: Secondary | ICD-10-CM

## 2020-09-23 DIAGNOSIS — E1165 Type 2 diabetes mellitus with hyperglycemia: Secondary | ICD-10-CM

## 2020-09-24 ENCOUNTER — Other Ambulatory Visit: Payer: 59

## 2020-10-01 ENCOUNTER — Encounter: Payer: Self-pay | Admitting: Family Medicine

## 2020-10-01 ENCOUNTER — Ambulatory Visit (INDEPENDENT_AMBULATORY_CARE_PROVIDER_SITE_OTHER): Payer: No Typology Code available for payment source | Admitting: Family Medicine

## 2020-10-01 ENCOUNTER — Other Ambulatory Visit: Payer: Self-pay

## 2020-10-01 VITALS — BP 140/84 | HR 82 | Temp 97.9°F | Ht 72.0 in | Wt 306.2 lb

## 2020-10-01 DIAGNOSIS — Z125 Encounter for screening for malignant neoplasm of prostate: Secondary | ICD-10-CM

## 2020-10-01 DIAGNOSIS — Z0000 Encounter for general adult medical examination without abnormal findings: Secondary | ICD-10-CM | POA: Diagnosis not present

## 2020-10-01 DIAGNOSIS — E1169 Type 2 diabetes mellitus with other specified complication: Secondary | ICD-10-CM

## 2020-10-01 DIAGNOSIS — E1165 Type 2 diabetes mellitus with hyperglycemia: Secondary | ICD-10-CM | POA: Diagnosis not present

## 2020-10-01 DIAGNOSIS — E118 Type 2 diabetes mellitus with unspecified complications: Secondary | ICD-10-CM

## 2020-10-01 DIAGNOSIS — I10 Essential (primary) hypertension: Secondary | ICD-10-CM

## 2020-10-01 DIAGNOSIS — IMO0002 Reserved for concepts with insufficient information to code with codable children: Secondary | ICD-10-CM

## 2020-10-01 DIAGNOSIS — E1140 Type 2 diabetes mellitus with diabetic neuropathy, unspecified: Secondary | ICD-10-CM

## 2020-10-01 DIAGNOSIS — E785 Hyperlipidemia, unspecified: Secondary | ICD-10-CM

## 2020-10-01 DIAGNOSIS — I82441 Acute embolism and thrombosis of right tibial vein: Secondary | ICD-10-CM

## 2020-10-01 LAB — HEPATIC FUNCTION PANEL
ALT: 30 U/L (ref 0–53)
AST: 20 U/L (ref 0–37)
Albumin: 4 g/dL (ref 3.5–5.2)
Alkaline Phosphatase: 59 U/L (ref 39–117)
Bilirubin, Direct: 0.1 mg/dL (ref 0.0–0.3)
Total Bilirubin: 0.7 mg/dL (ref 0.2–1.2)
Total Protein: 6.8 g/dL (ref 6.0–8.3)

## 2020-10-01 LAB — POC URINALSYSI DIPSTICK (AUTOMATED)
Bilirubin, UA: NEGATIVE
Blood, UA: NEGATIVE
Glucose, UA: POSITIVE — AB
Ketones, UA: NEGATIVE
Leukocytes, UA: NEGATIVE
Nitrite, UA: NEGATIVE
Protein, UA: NEGATIVE
Spec Grav, UA: >=1.03 — AB (ref 1.010–1.025)
Urobilinogen, UA: 0.2 E.U./dL
pH, UA: 5.5 (ref 5.0–8.0)

## 2020-10-01 LAB — POCT GLYCOSYLATED HEMOGLOBIN (HGB A1C): Hemoglobin A1C: 9.6 % — AB (ref 4.0–5.6)

## 2020-10-01 LAB — MICROALBUMIN / CREATININE URINE RATIO
Creatinine,U: 130.8 mg/dL
Microalb Creat Ratio: 1.4 mg/g (ref 0.0–30.0)
Microalb, Ur: 1.8 mg/dL (ref 0.0–1.9)

## 2020-10-01 LAB — BASIC METABOLIC PANEL
BUN: 12 mg/dL (ref 6–23)
CO2: 27 mEq/L (ref 19–32)
Calcium: 9.4 mg/dL (ref 8.4–10.5)
Chloride: 103 mEq/L (ref 96–112)
Creatinine, Ser: 1.01 mg/dL (ref 0.40–1.50)
GFR: 82.82 mL/min (ref >60.00)
Glucose, Bld: 241 mg/dL — ABNORMAL HIGH (ref 70–99)
Potassium: 4.3 mEq/L (ref 3.5–5.1)
Sodium: 139 mEq/L (ref 135–145)

## 2020-10-01 LAB — LIPID PANEL
Cholesterol: 216 mg/dL — ABNORMAL HIGH (ref 0–200)
HDL: 34.4 mg/dL — ABNORMAL LOW (ref >39.00)
Total CHOL/HDL Ratio: 6
Triglycerides: 458 mg/dL — ABNORMAL HIGH (ref 0.0–149.0)

## 2020-10-01 LAB — LDL CHOLESTEROL, DIRECT: Direct LDL: 75 mg/dL

## 2020-10-01 LAB — PSA: PSA: 0.51 ng/mL (ref 0.10–4.00)

## 2020-10-01 MED ORDER — PANTOPRAZOLE SODIUM 20 MG PO TBEC: 20.0000 mg | DELAYED_RELEASE_TABLET | Freq: Every day | ORAL | 3 refills | Status: DC

## 2020-10-01 MED ORDER — ATORVASTATIN CALCIUM 40 MG PO TABS: 40.0000 mg | ORAL_TABLET | Freq: Every day | ORAL | 3 refills | Status: DC

## 2020-10-01 MED ORDER — DAPAGLIFLOZIN PROPANEDIOL 10 MG PO TABS: 10.0000 mg | ORAL_TABLET | Freq: Every day | ORAL | 11 refills | Status: DC

## 2020-10-01 MED ORDER — GLIPIZIDE 10 MG PO TABS: 10.0000 mg | ORAL_TABLET | Freq: Two times a day (BID) | ORAL | 3 refills | Status: DC

## 2020-10-01 MED ORDER — METFORMIN HCL 500 MG PO TABS: 1000.0000 mg | ORAL_TABLET | Freq: Two times a day (BID) | ORAL | 3 refills | Status: DC

## 2020-10-01 MED ORDER — GLUCOSE BLOOD VI STRP: ORAL_STRIP | 3 refills | Status: AC

## 2020-10-01 NOTE — Assessment & Plan Note (Addendum)
Should have completed 3 months of eliquis treatment.

## 2020-10-01 NOTE — Assessment & Plan Note (Addendum)
Chronic, borderline off antihypertensive. Recheck next visit and if staying elevated low threshold to start ACEI/ARB

## 2020-10-01 NOTE — Progress Notes (Signed)
Patient ID: Jonathon [name], male    DOB: 05-Jan-1966, 55 y.o.   MRN: [id number]  This visit was conducted in person.  BP 140/84   Pulse 82   Temp 97.9 F (36.6 C) (Temporal)   Ht 6' (1.829 m)   Wt (!) 306 lb 3 oz (138.9 kg)   SpO2 97%   BMI 41.53 kg/m   BP Readings from Last 3 Encounters:  10/01/20 140/84  07/07/20 122/88  06/25/20 118/80   CC: CPE Subjective:   HPI: Jonathon [name] is a 55 y.o. male presenting on 10/01/2020 for Annual Exam   H/o COVID pneumonia 08/2019 with prolonged recovery.   DM - trouble finding one touch test strips. Not checking regularly. Unable to afford jardiance or ozempic - only taking metformin and glipizide.   H/o fatty liver by previous imaging at the [place].  Just started stress management glasses through the [place].  Was on gabapentin for leg pain at night - may have caused leg swelling so he stopped it.   Preventative: COLONOSCOPY 02/14/2018 - at [facility] per patient - told normal Prostate cancer screening - no nocturia, strong stream. Will continue PSA yearly Lung cancer screening - not eligible Flu shot - declines COVID vaccine Pfizer 11/2019 x2, booster 06/2020 Tdap 2012-11-08 Pneumovax - 05/2019 Shingrix - discussed. Doesn't think he had chicken pox grwoing up  Seat belt use discussed Sunscreen use discussed. No changing moles on skin  Non smoker Alcohol - none - stopped drinking Dentist yearly Eye exam overdue   Widower - wife suddenly passed away 2011-11-09. New wife.  Has 5 children but none live at home. 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 Interim medical history since our last visit reviewed. Allergies and medications reviewed and updated. Outpatient Medications Prior to Visit  Medication Sig Dispense Refill  . clotrimazole (LOTRIMIN) 1  % cream APPLY TO AFFECTED AREA TWICE A DAY 60 g 1  . metFORMIN (GLUCOPHAGE) 500 MG tablet Take 2 tablets (1,000 mg total) by mouth 2 (two) times daily with a meal. 180 tablet 3  . sildenafil (VIAGRA) 100 MG tablet TAKE 0.5-1 TABLETS BY MOUTH DAILY AS NEEDED FOR ERECTILE DYSFUNCTION. 5 tablet 3  . tadalafil (CIALIS) 20 MG tablet Take 0.5-1 tablets (10-20 mg total) by mouth every other day as needed for erectile dysfunction. 5 tablet 11  . gabapentin (NEURONTIN) 300 MG capsule Take 300 mg by mouth as needed.    Marland Kitchen glipiZIDE (GLUCOTROL) 10 MG tablet TAKE 1 TABLET (10 MG TOTAL) BY MOUTH 2 (TWO) TIMES DAILY BEFORE LUNCH AND SUPPER. 180 tablet 0  . glucose blood (ONE TOUCH ULTRA TEST) test strip Check blood sugar twice a day and as instructed. Dx 250.00 100 each 3  . pantoprazole (PROTONIX) 20 MG tablet Take 1 tablet (20 mg total) by mouth daily. 30 tablet 1  . empagliflozin (JARDIANCE) 25 MG TABS tablet Take 1 tablet (25 mg total) by mouth daily. 30 tablet 6  . OZEMPIC, 0.25 OR 0.5 MG/DOSE, 2 MG/1.5ML SOPN INJECT 0.25 MG INTO THE SKIN ONCE A WEEK FOR 14 DAYS, THEN 0.5 MG ONCE A WEEK. 1.5 mL 5   No facility-administered medications prior to visit.     Per HPI unless specifically indicated in ROS section below Review of Systems  Constitutional: Negative for activity change, appetite change, chills, fatigue, fever and unexpected weight change.  HENT: Negative for hearing loss.   Eyes: Negative for visual disturbance.  Respiratory: Negative for cough, chest tightness, shortness of breath and wheezing.   Cardiovascular: Positive for leg swelling (at ankles). Negative for chest pain and palpitations.  Gastrointestinal: Negative for abdominal distention, abdominal pain, blood in stool, constipation, diarrhea, nausea and vomiting.  Genitourinary: Negative for difficulty urinating and hematuria.  Musculoskeletal: Negative for arthralgias, myalgias and neck pain.  Skin: Negative for rash.  Neurological:  Negative for dizziness, seizures, syncope and headaches.  Hematological: Negative for adenopathy. Does not bruise/bleed easily.  Psychiatric/Behavioral: Negative for dysphoric mood. The patient is nervous/anxious (taking stress management classes through [facility]).    Objective:  BP 140/84   Pulse 82   Temp 97.9 F (36.6 C) (Temporal)   Ht 6' (1.829 m)   Wt (!) 306 lb 3 oz (138.9 kg)   SpO2 97%   BMI 41.53 kg/m   Wt Readings from Last 3 Encounters:  10/01/20 (!) 306 lb 3 oz (138.9 kg)  07/07/20 (!) 301 lb 6.4 oz (136.7 kg)  06/25/20 297 lb (134.7 kg)      Physical Exam Vitals and nursing note reviewed.  Constitutional:      General: He is not in acute distress.    Appearance: He is well-developed.  HENT:     Head: Normocephalic and atraumatic.     Right Ear: Hearing, tympanic membrane, ear canal and external ear normal.     Left Ear: Hearing, tympanic membrane, ear canal and external ear normal.     Nose: Nose normal.     Mouth/Throat:     Pharynx: Uvula midline. No oropharyngeal exudate or posterior oropharyngeal erythema.  Eyes:     General: No scleral icterus.    Conjunctiva/sclera: Conjunctivae normal.     Pupils: Pupils are equal, round, and reactive to light.  Cardiovascular:     Rate and Rhythm: Normal rate and regular rhythm.     Pulses:          Radial pulses are 2+ on the right side and 2+ on the left side.     Heart sounds: Normal heart sounds. No murmur heard.   Pulmonary:     Effort: Pulmonary effort is normal. No respiratory distress.     Breath sounds: Normal breath sounds. No wheezing or rales.  Abdominal:     General: Bowel sounds are normal. There is no distension.     Palpations: Abdomen is soft. There is no mass.     Tenderness: There is no abdominal tenderness. There is no guarding or rebound.  Musculoskeletal:        General: Normal range of motion.     Cervical back: Normal range of motion and neck supple.  Lymphadenopathy:     Cervical: No  cervical adenopathy.  Skin:    General: Skin is warm and dry.     Findings: No rash.  Neurological:     Mental Status: He is alert and oriented to person, place, and time.     Comments: CN grossly intact, station and gait intact  Psychiatric:        Behavior: Behavior normal.        Thought Content: Thought content normal.        Judgment: Judgment normal.       Results for orders placed or performed in visit on 10/01/20  POCT glycosylated hemoglobin (Hb A1C)  Result  Value Ref Range   Hemoglobin A1C 9.6 (A) 4.0 - 5.6 %   HbA1c POC (<> result, manual entry)     HbA1c, POC (prediabetic range)     HbA1c, POC (controlled diabetic range)     Assessment & Plan:  This visit occurred during the SARS-CoV-2 public health emergency.  Safety protocols were in place, including screening questions prior to the visit, additional usage of staff PPE, and extensive cleaning of exam room while observing appropriate contact time as indicated for disinfecting solutions.   Problem List Items Addressed This Visit    Diabetes mellitus type 2, uncontrolled, with complications (HCC)    Deteriorated control since he stopped ozempic and jardiance. Will price out farxiga which seems to be on insurance formulary. If unaffordable, will send in actos. Check UA today. Continue metformin and glipizide. RTC 3-4 mo DM f/u visit .       Relevant Medications   glipiZIDE (GLUCOTROL) 10 MG tablet   dapagliflozin propanediol (FARXIGA) 10 MG TABS tablet   Other Relevant Orders   POCT glycosylated hemoglobin (Hb A1C) (Completed)   Microalbumin / creatinine urine ratio   POCT Urinalysis Dipstick (Automated)   Hypertension, essential    Chronic, borderline off antihypertensive. Recheck next visit and if staying elevated low threshold to start ACEI/ARB      Dyslipidemia associated with type 2 diabetes mellitus (HCC)    Looks like no longer on atorvastatin - will update FLP and likely restart. The 10-year ASCVD risk  score Denman George(Goff DC Montez HagemanJr., et al., 2013) is: 44.3%   Values used to calculate the score:     Age: [age] years     Sex: Male     Is Non-Hispanic African American: Yes     Diabetic: Yes     Tobacco smoker: Yes     Systolic Blood Pressure: 140 mmHg     Is BP treated: Yes     HDL Cholesterol: 36 mg/dL     Total Cholesterol: 246 mg/dL       Relevant Medications   glipiZIDE (GLUCOTROL) 10 MG tablet   dapagliflozin propanediol (FARXIGA) 10 MG TABS tablet   Other Relevant Orders   Lipid panel   Basic metabolic panel   Hepatic function panel   Obesity, morbid, BMI 40.0-49.9 (HCC)    Discussed weight gain noted, encouraged healthy diet and lifestyle choices to affect sustainable weight loss.       Relevant Medications   glipiZIDE (GLUCOTROL) 10 MG tablet   dapagliflozin propanediol (FARXIGA) 10 MG TABS tablet   Health maintenance examination - Primary    Preventative protocols reviewed and updated unless pt declined. Discussed healthy diet and lifestyle.       Acute deep vein thrombosis (DVT) of right lower extremity (HCC)    Should have completed 3 months of eliquis treatment.       Type 2 diabetes mellitus with diabetic neuropathy, unspecified (HCC)   Relevant Medications   glipiZIDE (GLUCOTROL) 10 MG tablet   dapagliflozin propanediol (FARXIGA) 10 MG TABS tablet    Other Visit Diagnoses    Special screening for malignant neoplasm of prostate       Relevant Orders   PSA       Meds ordered this encounter  Medications  . glipiZIDE (GLUCOTROL) 10 MG tablet    Sig: Take 1 tablet (10 mg total) by mouth 2 (two) times daily before lunch and supper.    Dispense:  180 tablet    Refill:  3  .  pantoprazole (PROTONIX) 20 MG tablet    Sig: Take 1 tablet (20 mg total) by mouth daily.    Dispense:  90 tablet    Refill:  3  . glucose blood (ONE TOUCH ULTRA TEST) test strip    Sig: Check blood sugar twice a day and as instructed. Dx 250.00    Dispense:  100 each    Refill:  3  .  dapagliflozin propanediol (FARXIGA) 10 MG TABS tablet    Sig: Take 1 tablet (10 mg total) by mouth daily before breakfast.    Dispense:  30 tablet    Refill:  11    Price out to replace jardiance   Orders Placed This Encounter  Procedures  . Microalbumin / creatinine urine ratio  . Lipid panel  . Basic metabolic panel  . PSA  . Hepatic function panel  . POCT glycosylated hemoglobin (Hb A1C)  . POCT Urinalysis Dipstick (Automated)    Patient instructions: Call to schedule diabetic eye exam  Consider shingrix vaccine.  Price out Marcelline Deist which is in same family as jardiance. Let me know if unaffordable as we will then start actos for diabetes.  Urinalysis today.  Return as needed or in 4 months for diabetes follow up.  Follow up plan: Return in about 4 months (around 01/31/2021) for follow up visit.  [name], MD

## 2020-10-01 NOTE — Patient Instructions (Addendum)
Call to schedule diabetic eye exam  Consider shingrix vaccine.  Price out Marcelline Deist which is in same family as jardiance. Let me know if unaffordable as we will then start actos for diabetes.  Urinalysis today.  Return as needed or in 4 months for diabetes follow up.  Health Maintenance, Male Adopting a healthy lifestyle and getting preventive care are important in promoting health and wellness. Ask your health care provider about:  The right schedule for you to have regular tests and exams.  Things you can do on your own to prevent diseases and keep yourself healthy. What should I know about diet, weight, and exercise? Eat a healthy diet  Eat a diet that includes plenty of vegetables, fruits, low-fat dairy products, and lean protein.  Do not eat a lot of foods that are high in solid fats, added sugars, or sodium.   Maintain a healthy weight Body mass index (BMI) is a measurement that can be used to identify possible weight problems. It estimates body fat based on height and weight. Your health care provider can help determine your BMI and help you achieve or maintain a healthy weight. Get regular exercise Get regular exercise. This is one of the most important things you can do for your health. Most adults should:  Exercise for at least 150 minutes each week. The exercise should increase your heart rate and make you sweat (moderate-intensity exercise).  Do strengthening exercises at least twice a week. This is in addition to the moderate-intensity exercise.  Spend less time sitting. Even light physical activity can be beneficial. Watch cholesterol and blood lipids Have your blood tested for lipids and cholesterol at 57 years of age, then have this test every 5 years. You may need to have your cholesterol levels checked more often if:  Your lipid or cholesterol levels are high.  You are older than 57 years of age.  You are at high risk for heart disease. What should I know about  cancer screening? Many types of cancers can be detected early and may often be prevented. Depending on your health history and family history, you may need to have cancer screening at various ages. This may include screening for:  Colorectal cancer.  Prostate cancer.  Skin cancer.  Lung cancer. What should I know about heart disease, diabetes, and high blood pressure? Blood pressure and heart disease  High blood pressure causes heart disease and increases the risk of stroke. This is more likely to develop in people who have high blood pressure readings, are of African descent, or are overweight.  Talk with your health care provider about your target blood pressure readings.  Have your blood pressure checked: ? Every 3-5 years if you are 64-67 years of age. ? Every year if you are 24 years old or older.  If you are between the ages of 32 and 23 and are a current or former smoker, ask your health care provider if you should have a one-time screening for abdominal aortic aneurysm (AAA). Diabetes Have regular diabetes screenings. This checks your fasting blood sugar level. Have the screening done:  Once every three years after age 45 if you are at a normal weight and have a low risk for diabetes.  More often and at a younger age if you are overweight or have a high risk for diabetes. What should I know about preventing infection? Hepatitis B If you have a higher risk for hepatitis B, you should be screened for this virus. Talk  with your health care provider to find out if you are at risk for hepatitis B infection. Hepatitis C Blood testing is recommended for:  Everyone born from [birth year] through 1967.  Anyone with known risk factors for hepatitis C. Sexually transmitted infections (STIs)  You should be screened each year for STIs, including gonorrhea and chlamydia, if: ? You are sexually active and are younger than 57 years of age. ? You are older than 57 years of age and your health  care provider tells you that you are at risk for this type of infection. ? Your sexual activity has changed since you were last screened, and you are at increased risk for chlamydia or gonorrhea. Ask your health care provider if you are at risk.  Ask your health care provider about whether you are at high risk for HIV. Your health care provider may recommend a prescription medicine to help prevent HIV infection. If you choose to take medicine to prevent HIV, you should first get tested for HIV. You should then be tested every 3 months for as long as you are taking the medicine. Follow these instructions at home: Lifestyle  Do not use any products that contain nicotine or tobacco, such as cigarettes, e-cigarettes, and chewing tobacco. If you need help quitting, ask your health care provider.  Do not use street drugs.  Do not share needles.  Ask your health care provider for help if you need support or information about quitting drugs. Alcohol use  Do not drink alcohol if your health care provider tells you not to drink.  If you drink alcohol: ? Limit how much you have to 0-2 drinks a day. ? Be aware of how much alcohol is in your drink. In the [place] one drink equals one 12 oz bottle of beer (355 mL), one 5 oz glass of wine (148 mL), or one 1 oz glass of hard liquor (44 mL). General instructions  Schedule regular health, dental, and eye exams.  Stay current with your vaccines.  Tell your health care provider if: ? You often feel depressed. ? You have ever been abused or do not feel safe at home. Summary  Adopting a healthy lifestyle and getting preventive care are important in promoting health and wellness.  Follow your health care provider's instructions about healthy diet, exercising, and getting tested or screened for diseases.  Follow your health care provider's instructions on monitoring your cholesterol and blood pressure. This information is not intended to replace advice  given to you by your health care provider. Make sure you discuss any questions you have with your health care provider. Document Revised: 06/26/2018 Document Reviewed: 06/26/2018 Elsevier Patient Education  2021 [employer].

## 2020-10-01 NOTE — Assessment & Plan Note (Signed)
Looks like no longer on atorvastatin - will update FLP and likely restart. The 10-year ASCVD risk score Denman George DC Montez Hageman., et al., 2013) is: 44.3%   Values used to calculate the score:     Age: 55 years     Sex: Male     Is Non-Hispanic African American: Yes     Diabetic: Yes     Tobacco smoker: Yes     Systolic Blood Pressure: 140 mmHg     Is BP treated: Yes     HDL Cholesterol: 36 mg/dL     Total Cholesterol: 246 mg/dL

## 2020-10-01 NOTE — Assessment & Plan Note (Signed)
Discussed weight gain noted, encouraged healthy diet and lifestyle choices to affect sustainable weight loss.  

## 2020-10-01 NOTE — Assessment & Plan Note (Signed)
Preventative protocols reviewed and updated unless pt declined. Discussed healthy diet and lifestyle.  

## 2020-10-01 NOTE — Assessment & Plan Note (Signed)
Deteriorated control since he stopped ozempic and jardiance. Will price out farxiga which seems to be on insurance formulary. If unaffordable, will send in actos. Check UA today. Continue metformin and glipizide. RTC 3-4 mo DM f/u visit .

## 2020-10-05 ENCOUNTER — Other Ambulatory Visit: Payer: Self-pay | Admitting: Family Medicine

## 2020-10-06 NOTE — Telephone Encounter (Signed)
Refill request Sildenafil Last refill 08/11/20 Last office visit 10/01/20 Patient also has Cialis on his medication list

## 2020-10-07 NOTE — Telephone Encounter (Signed)
Pt left /vm wanting status of refill for sildenafil; pt request cb.

## 2020-10-12 ENCOUNTER — Other Ambulatory Visit: Payer: Self-pay | Admitting: Family Medicine

## 2020-10-12 DIAGNOSIS — IMO0002 Reserved for concepts with insufficient information to code with codable children: Secondary | ICD-10-CM

## 2020-10-12 DIAGNOSIS — E1165 Type 2 diabetes mellitus with hyperglycemia: Secondary | ICD-10-CM

## 2020-10-12 MED ORDER — PIOGLITAZONE HCL 30 MG PO TABS: 30.0000 mg | ORAL_TABLET | Freq: Every day | ORAL | 1 refills | Status: DC

## 2020-11-04 ENCOUNTER — Telehealth: Payer: Self-pay

## 2020-11-04 NOTE — Progress Notes (Deleted)
HPI M never smoker followed for OSA, complicated by R DVT, HTN, Covid pneumonia Feb 2021, DM2/ neuropathy, Dyslipidemia, Sciatica, Morbid Obesity, HST 02/11/20- mild complex(central and obstructive) sleep apnea, AHI 11.9/ hr, desaturation to 80%, body weight 296 lbs  ====================================================================   07/07/20- 54 yoM never smoker followed for OSA, complicated by R DVT, HTN, Covid pneumonia Feb 2021, DM2/ neuropathy, Dyslipidemia, Sciatica, Morbid Obesity, PTSD,  CPAP referral to [facility] 5-20 on 04/06/20 Oral Appliance referral to Dr Katz on 04/06/20 Download- Body weight today- 301 lbs Covid vax- 2Phizer Flu vax- declines He met with Dr Katz to discuss OAP.  He hasn't been able to connect with [facility] to discuss CPAP option, but would like to. Denies changes or acute events. Understands weight loss may remove need for treatment of OSA.  11/05/20- 55 yoM never smoker followed for OSA, complicated by R DVT, HTN, Covid pneumonia Feb 2021, DM2/ neuropathy, Dyslipidemia, Sciatica, Morbid Obesity, PTSD, Had been referred to [facility] for CPAP and to Dr Katz for OAP. Body weight today- Covid vax- Flu vax-   ROS-see HPI   + = positive Constitutional:    weight loss, night sweats, fevers, chills, fatigue, lassitude. HEENT:    headaches, difficulty swallowing, tooth/dental problems, sore throat,       sneezing, itching, ear ache, nasal congestion, post nasal drip, snoring CV:    chest pain, orthopnea, PND, +swelling in lower extremities, anasarca,                                   dizziness, palpitations Resp:   shortness of breath with exertion or at rest.                productive cough,   non-productive cough, coughing up of blood.              change in color of mucus.  wheezing.   Skin:    rash or lesions. GI:  No-   heartburn, indigestion, abdominal pain, nausea, vomiting, diarrhea,                 change in bowel habits, loss of appetite GU: dysuria,  change in color of urine, no urgency or frequency.   flank pain. MS:   joint pain, stiffness, decreased range of motion, back pain. Neuro-     nothing unusual Psych:  change in mood or affect.  depression or anxiety.   memory loss.  OBJ- Physical Exam General- Alert, Oriented, Affect-appropriate, Distress- none acute, + obese Skin- rash-none, lesions- none, excoriation- none Lymphadenopathy- none Head- atraumatic            Eyes- Gross vision intact, PERRLA, conjunctivae and secretions clear            Ears- Hearing, canals-normal            Nose- Clear, no-Septal dev, mucus, polyps, erosion, perforation             Throat- Mallampati III , mucosa clear , drainage- none, tonsils- atrophic,  + teeth Neck- flexible , trachea midline, no stridor , thyroid nl, carotid no bruit Chest - symmetrical excursion , unlabored           Heart/CV- RRR , no murmur , no gallop  , no rub, nl s1 s2                           -   JVD- none , edema- none, stasis changes- none, varices- none           Lung- clear to P&A, wheeze- none, cough- none , dullness-none, rub- none           Chest wall-  Abd-  Br/ Gen/ Rectal- Not done, not indicated Extrem- cyanosis- none, clubbing, none, atrophy- none, strength- nl Neuro- grossly intact to observation

## 2020-11-04 NOTE — Telephone Encounter (Signed)
LVM for pt to bring SD card from CPAP machine to visit tomorrow. Nothing further needed.

## 2020-11-05 ENCOUNTER — Ambulatory Visit: Payer: 59 | Admitting: Internal Medicine

## 2020-11-30 ENCOUNTER — Other Ambulatory Visit: Payer: Self-pay | Admitting: Family Medicine

## 2020-12-24 ENCOUNTER — Other Ambulatory Visit: Payer: Self-pay | Admitting: Family Medicine

## 2020-12-24 NOTE — Telephone Encounter (Signed)
Rx sent to [facility] 12/01/20, #5/6.

## 2020-12-27 ENCOUNTER — Other Ambulatory Visit: Payer: Self-pay

## 2020-12-27 NOTE — Telephone Encounter (Signed)
Lvm asking pt to call back.  Need to know for sure what med he's requesting 10 pills for.  

## 2020-12-27 NOTE — Telephone Encounter (Signed)
[facility] Night - Client Nonclinical Telephone Record  [facility] Client [facility] Night - Client Client Site [facility] [place] - Night Physician [name] - MD Contact Type Call Who Is Calling Patient / Member / Family / Caregiver Caller Name Tarquin Welcher Caller Phone Number [phone] Patient Name Jonathon [name] Patient DOB 22-Apr-1966 Call Type Message Only Information Provided Reason for Call Request for General Office Information Initial Comment Caller states he wanted to see about increasing his medicine. He is only getting five pills and he is wondering if he can move up to 10 pills. Additional Comment Office hours provided. Disp. Time Disposition Final User 12/25/2020 2:25:20 PM General Information Provided Yes Jake Seats Call Closed By: Jake Seats Transaction Date/Time: 12/25/2020 2:23:08 PM (ET)

## 2020-12-28 NOTE — Telephone Encounter (Signed)
Lvm asking pt to call back.  Need to know for sure what med he's requesting 10 pills for.

## 2020-12-29 NOTE — Telephone Encounter (Signed)
Lvm asking pt to call back.  Need to know for sure what med he's requesting 10 pills for.  

## 2020-12-30 NOTE — Telephone Encounter (Signed)
Lvm asking pt to call back.  Need to know for sure what med he's requesting 10 pills for.  Mailing a letter.

## 2020-12-31 MED ORDER — SILDENAFIL CITRATE 100 MG PO TABS: ORAL_TABLET | ORAL | 6 refills | Status: DC

## 2020-12-31 NOTE — Telephone Encounter (Signed)
Its for slidenafil

## 2020-12-31 NOTE — Addendum Note (Signed)
Addended by: Nanci Pina on: 12/31/2020 03:49 PM   Modules accepted: Orders

## 2020-12-31 NOTE — Telephone Encounter (Signed)
ERx 

## 2021-01-07 ENCOUNTER — Encounter: Payer: Self-pay | Admitting: Family Medicine

## 2021-01-07 ENCOUNTER — Ambulatory Visit (INDEPENDENT_AMBULATORY_CARE_PROVIDER_SITE_OTHER): Payer: No Typology Code available for payment source | Admitting: Family Medicine

## 2021-01-07 ENCOUNTER — Other Ambulatory Visit: Payer: Self-pay

## 2021-01-07 ENCOUNTER — Telehealth: Payer: Self-pay | Admitting: Family Medicine

## 2021-01-07 VITALS — BP 122/86 | HR 89 | Temp 97.4°F | Ht 72.0 in | Wt 301.2 lb

## 2021-01-07 DIAGNOSIS — I10 Essential (primary) hypertension: Secondary | ICD-10-CM | POA: Diagnosis not present

## 2021-01-07 DIAGNOSIS — E118 Type 2 diabetes mellitus with unspecified complications: Secondary | ICD-10-CM | POA: Diagnosis not present

## 2021-01-07 DIAGNOSIS — E1140 Type 2 diabetes mellitus with diabetic neuropathy, unspecified: Secondary | ICD-10-CM

## 2021-01-07 DIAGNOSIS — E1165 Type 2 diabetes mellitus with hyperglycemia: Secondary | ICD-10-CM

## 2021-01-07 DIAGNOSIS — IMO0002 Reserved for concepts with insufficient information to code with codable children: Secondary | ICD-10-CM

## 2021-01-07 LAB — POCT GLYCOSYLATED HEMOGLOBIN (HGB A1C): Hemoglobin A1C: 10.6 % — AB (ref 4.0–5.6)

## 2021-01-07 NOTE — Progress Notes (Signed)
Patient ID: Jonathon [name], male    DOB: [birth year], 55 y.o.   MRN: [id number]  This visit was conducted in person.  BP 122/86   Pulse 89   Temp (!) 97.4 F (36.3 C) (Temporal)   Ht 6' (1.829 m)   Wt (!) 301 lb 4 oz (136.6 kg)   SpO2 97%   BMI 40.86 kg/m    CC: DM f/u visit  Subjective:   HPI: Jonathon [name] is a 55 y.o. male presenting on 01/07/2021 for Diabetes (Here for 3 mo f/u.)   DM - does regularly check sugars 220 fasting. Frustrated because all medications which were most effective are unaffordable. Compliant with antihyperglycemic regimen which includes: glipizide 10mg  bid, metformin 1000mg  bid, actos 30mg  daily. Denies low sugars or hypoglycemic symptoms. Denies paresthesias. Last diabetic eye exam DUE. Pneumovax: 05/2019. Prevnar: to consider prevnar20. Glucometer brand: one touch. DSME: through the . Had lost weight to 272 lbs on ozempic.  Lab Results  Component Value Date   HGBA1C 10.6 (A) 01/07/2021   Diabetic Foot Exam - Simple   Simple Foot Form Diabetic Foot exam was performed with the following findings: Yes 01/07/2021  9:15 AM  Visual Inspection See comments: Yes Sensation Testing See comments: Yes Pulse Check Posterior Tibialis and Dorsalis pulse intact bilaterally: Yes Comments Diminished sensation to monofilament testing R>L Thickened skin on R sole     Lab Results  Component Value Date   MICROALBUR 1.8 10/01/2020        Relevant past medical, surgical, family and social history reviewed and updated as indicated. Interim medical history since our last visit reviewed. Allergies and medications reviewed and updated. Outpatient Medications Prior to Visit  Medication Sig Dispense Refill   atorvastatin (LIPITOR) 40 MG tablet Take 1 tablet (40 mg total) by mouth daily. at 6pm 90 tablet 3   clotrimazole (LOTRIMIN) 1 % cream APPLY TO AFFECTED AREA TWICE A DAY 60 g 1   glipiZIDE (GLUCOTROL) 10 MG tablet Take 1 tablet (10 mg total) by mouth 2 (two) times  daily before lunch and supper. 180 tablet 3   glucose blood (ONE TOUCH ULTRA TEST) test strip Check blood sugar twice a day and as instructed. Dx 250.00 100 each 3   metFORMIN (GLUCOPHAGE) 500 MG tablet Take 2 tablets (1,000 mg total) by mouth 2 (two) times daily with a meal. 180 tablet 3   pantoprazole (PROTONIX) 20 MG tablet Take 1 tablet (20 mg total) by mouth daily. 90 tablet 3   pioglitazone (ACTOS) 30 MG tablet Take 1 tablet (30 mg total) by mouth daily. 90 tablet 1   sildenafil (VIAGRA) 100 MG tablet TAKE 1/2 TO 1 TABLET BY MOUTH DAILY AS NEEDED FOR FOR ERECTILE DYSFUNCTION 10 tablet 6   tadalafil (CIALIS) 20 MG tablet Take 0.5-1 tablets (10-20 mg total) by mouth every other day as needed for erectile dysfunction. 5 tablet 11   No facility-administered medications prior to visit.     Per HPI unless specifically indicated in ROS section below Review of Systems  Objective:  BP 122/86   Pulse 89   Temp (!) 97.4 F (36.3 C) (Temporal)   Ht 6' (1.829 m)   Wt (!) 301 lb 4 oz (136.6 kg)   SpO2 97%   BMI 40.86 kg/m   Wt Readings from Last 3 Encounters:  01/07/21 (!) 301 lb 4 oz (136.6 kg)  10/01/20 (!) 306 lb 3 oz (138.9 kg)  07/07/20 (!) 301 lb 6.4 oz (136.7 kg)  Physical Exam Vitals and nursing note reviewed.  Constitutional:      Appearance: Normal appearance. He is not ill-appearing.  Eyes:     Extraocular Movements: Extraocular movements intact.     Conjunctiva/sclera: Conjunctivae normal.     Pupils: Pupils are equal, round, and reactive to light.  Cardiovascular:     Rate and Rhythm: Normal rate and regular rhythm.     Pulses: Normal pulses.     Heart sounds: Normal heart sounds. No murmur heard. Pulmonary:     Effort: Pulmonary effort is normal. No respiratory distress.     Breath sounds: Normal breath sounds. No wheezing, rhonchi or rales.  Musculoskeletal:     Right lower leg: No edema.     Left lower leg: No edema.     Comments: See HPI for foot exam if  done  Skin:    General: Skin is warm and dry.     Findings: No rash.  Neurological:     Mental Status: He is alert.  Psychiatric:        Mood and Affect: Mood normal.        Behavior: Behavior normal.      Results for orders placed or performed in visit on 01/07/21  POCT glycosylated hemoglobin (Hb A1C)  Result Value Ref Range   Hemoglobin A1C 10.6 (A) 4.0 - 5.6 %   HbA1c POC (<> result, manual entry)     HbA1c, POC (prediabetic range)     HbA1c, POC (controlled diabetic range)      Assessment & Plan:  This visit occurred during the SARS-CoV-2 public health emergency.  Safety protocols were in place, including screening questions prior to the visit, additional usage of staff PPE, and extensive cleaning of exam room while observing appropriate contact time as indicated for disinfecting solutions.   Problem List Items Addressed This Visit     Diabetes mellitus type 2, uncontrolled, with complications (HCC) - Primary    Chronic, deteriorated. Frustration as unable to afford many medicines recommended - no GLP1-RA, SGLT2i is affordable.  Will check into patient assistance for ozempic. If still unable to afford, will start insulin.  I did ask him to check ADA website.  RTC 3 mo DM f/u visit       Relevant Orders   POCT glycosylated hemoglobin (Hb A1C) (Completed)   Ambulatory referral to Ophthalmology   Hypertension, essential    Stable period off meds. Will not start ACEI/ARB at this time.        Obesity, morbid, BMI 40.0-49.9 (HCC)    Was able to drop weight to nadir 270lbs while on ozempic. See below for patient assistance option.        Type 2 diabetes mellitus with diabetic neuropathy, unspecified (HCC)    Evidence of diabetic neuropathy on foot exam today.          No orders of the defined types were placed in this encounter.  Orders Placed This Encounter  Procedures   Ambulatory referral to Ophthalmology    Referral Priority:   Routine    Referral Type:    Consultation    Referral Reason:   Specialty Services Required    Requested Specialty:   Ophthalmology    Number of Visits Requested:   1   POCT glycosylated hemoglobin (Hb A1C)     Patient instructions: We will look into patient assistance for ozempic. If unable to do ozempic, we will start insulin.  We will refer you for eye exam.  Return as needed or in 3 months for diabetes follow up visit.  Look at diabetes.org website for recipes   Follow up plan: Return in about 3 months (around 04/09/2021) for follow up visit.  [name], MD

## 2021-01-07 NOTE — Assessment & Plan Note (Signed)
Evidence of diabetic neuropathy on foot exam today.

## 2021-01-07 NOTE — Telephone Encounter (Signed)
Can we set Jonathon [name] up for patient assistance application for ozempic? Thank you.

## 2021-01-07 NOTE — Telephone Encounter (Signed)
One of the qualifications for the PAP is pt cannot have insurance.   However, there is a savings card on the [employer] for pt's with insurance.  Spoke with pt relaying above info.  Verbalizes understanding and states he tried using the savings option on the website and med is still too expensive.  Pt is asking if there is another med he could try.  Plz advise.

## 2021-01-07 NOTE — Patient Instructions (Addendum)
We will look into patient assistance for ozempic. If unable to do ozempic, we will start insulin.  We will refer you for eye exam.  Return as needed or in 3 months for diabetes follow up visit.  Look at diabetes.org website for recipes   Diabetes Mellitus and Nutrition, Adult When you have diabetes, or diabetes mellitus, it is very important to have healthy eating habits because your blood sugar (glucose) levels are greatly affected by what you eat and drink. Eating healthy foods in the right amounts, at about the same times every day, can help you: Control your blood glucose. Lower your risk of heart disease. Improve your blood pressure. Reach or maintain a healthy weight. What can affect my meal plan? Every person with diabetes is different, and each person has different needs for a meal plan. Your health care provider may recommend that you work with a dietitian to make a meal plan that is best for you. Your meal plan may vary depending on factors such as: The calories you need. The medicines you take. Your weight. Your blood glucose, blood pressure, and cholesterol levels. Your activity level. Other health conditions you have, such as heart or kidney disease. How do carbohydrates affect me? Carbohydrates, also called carbs, affect your blood glucose level more than any other type of food. Eating carbs naturally raises the amount of glucose in your blood. Carb counting is a method for keeping track of how many carbs you eat. Counting carbs is important to keep your blood glucose at a healthy level,especially if you use insulin or take certain oral diabetes medicines. It is important to know how many carbs you can safely have in each meal. This is different for every person. Your dietitian can help you calculate how manycarbs you should have at each meal and for each snack. How does alcohol affect me? Alcohol can cause a sudden decrease in blood glucose (hypoglycemia), especially if you use  insulin or take certain oral diabetes medicines. Hypoglycemia can be a life-threatening condition. Symptoms of hypoglycemia, such as sleepiness, dizziness, and confusion, are similar to symptoms of having too much alcohol. Do not drink alcohol if: Your health care provider tells you not to drink. You are pregnant, may be pregnant, or are planning to become pregnant. If you drink alcohol: Do not drink on an empty stomach. Limit how much you use to: 0-1 drink a day for women. 0-2 drinks a day for men. Be aware of how much alcohol is in your drink. In the [place] one drink equals one 12 oz bottle of beer (355 mL), one 5 oz glass of wine (148 mL), or one 1 oz glass of hard liquor (44 mL). Keep yourself hydrated with water, diet soda, or unsweetened iced tea. Keep in mind that regular soda, juice, and other mixers may contain a lot of sugar and must be counted as carbs. What are tips for following this plan?  Reading food labels Start by checking the serving size on the "Nutrition Facts" label of packaged foods and drinks. The amount of calories, carbs, fats, and other nutrients listed on the label is based on one serving of the item. Many items contain more than one serving per package. Check the total grams (g) of carbs in one serving. You can calculate the number of servings of carbs in one serving by dividing the total carbs by 15. For example, if a food has 30 g of total carbs per serving, it would be equal to 2  servings of carbs. Check the number of grams (g) of saturated fats and trans fats in one serving. Choose foods that have a low amount or none of these fats. Check the number of milligrams (mg) of salt (sodium) in one serving. Most people should limit total sodium intake to less than 2,300 mg per day. Always check the nutrition information of foods labeled as "low-fat" or "nonfat." These foods may be higher in added sugar or refined carbs and should be avoided. Talk to your dietitian to  identify your daily goals for nutrients listed on the label. Shopping Avoid buying canned, pre-made, or processed foods. These foods tend to be high in fat, sodium, and added sugar. Shop around the outside edge of the grocery store. This is where you will most often find fresh fruits and vegetables, bulk grains, fresh meats, and fresh dairy. Cooking Use low-heat cooking methods, such as baking, instead of high-heat cooking methods like deep frying. Cook using healthy oils, such as olive, canola, or sunflower oil. Avoid cooking with butter, cream, or high-fat meats. Meal planning Eat meals and snacks regularly, preferably at the same times every day. Avoid going long periods of time without eating. Eat foods that are high in fiber, such as fresh fruits, vegetables, beans, and whole grains. Talk with your dietitian about how many servings of carbs you can eat at each meal. Eat 4-6 oz (112-168 g) of lean protein each day, such as lean meat, chicken, fish, eggs, or tofu. One ounce (oz) of lean protein is equal to: 1 oz (28 g) of meat, chicken, or fish. 1 egg.  cup (62 g) of tofu. Eat some foods each day that contain healthy fats, such as avocado, nuts, seeds, and fish. What foods should I eat? Fruits Berries. Apples. Oranges. Peaches. Apricots. Plums. Grapes. Mango. Papaya.Pomegranate. Kiwi. Cherries. Vegetables Lettuce. Spinach. Leafy greens, including kale, chard, collard greens, and mustard greens. Beets. Cauliflower. Cabbage. Broccoli. Carrots. Green beans.Tomatoes. Peppers. Onions. Cucumbers. Brussels sprouts. Grains Whole grains, such as whole-wheat or whole-grain bread, crackers, tortillas,cereal, and pasta. Unsweetened oatmeal. Quinoa. Brown or wild rice. Meats and other proteins Seafood. Poultry without skin. Lean cuts of poultry and beef. Tofu. Nuts. Seeds. Dairy Low-fat or fat-free dairy products such as milk, yogurt, and cheese. The items listed above may not be a complete list of  foods and beverages you can eat. Contact a dietitian for more information. What foods should I avoid? Fruits Fruits canned with syrup. Vegetables Canned vegetables. Frozen vegetables with butter or cream sauce. Grains Refined white flour and flour products such as bread, pasta, snack foods, andcereals. Avoid all processed foods. Meats and other proteins Fatty cuts of meat. Poultry with skin. Breaded or fried meats. Processed meat.Avoid saturated fats. Dairy Full-fat yogurt, cheese, or milk. Beverages Sweetened drinks, such as soda or iced tea. The items listed above may not be a complete list of foods and beverages you should avoid. Contact a dietitian for more information. Questions to ask a health care provider Do I need to meet with a diabetes educator? Do I need to meet with a dietitian? What number can I call if I have questions? When are the best times to check my blood glucose? Where to find more information: American Diabetes Association: diabetes.org Academy of Nutrition and Dietetics: www.eatright.[employer] of Diabetes and Digestive and Kidney Diseases: CarFli[employer].tn Association of Diabetes Care and Education Specialists: www.diabeteseducator.org Summary It is important to have healthy eating habits because your blood sugar (glucose) levels are greatly  affected by what you eat and drink. A healthy meal plan will help you control your blood glucose and maintain a healthy lifestyle. Your health care provider may recommend that you work with a dietitian to make a meal plan that is best for you. Keep in mind that carbohydrates (carbs) and alcohol have immediate effects on your blood glucose levels. It is important to count carbs and to use alcohol carefully. This information is not intended to replace advice given to you by your health care provider. Make sure you discuss any questions you have with your healthcare provider. Document Revised: 06/10/2019 Document  Reviewed: 06/10/2019 Elsevier Patient Education  2021 [employer].

## 2021-01-07 NOTE — Assessment & Plan Note (Addendum)
Chronic, deteriorated. Frustration as unable to afford many medicines recommended - no GLP1-RA, SGLT2i is affordable.  Will check into patient assistance for ozempic. If still unable to afford, will start insulin.  I did ask him to check ADA website.  RTC 3 mo DM f/u visit

## 2021-01-07 NOTE — Assessment & Plan Note (Signed)
Was able to drop weight to nadir 270lbs while on ozempic. See below for patient assistance option.

## 2021-01-07 NOTE — Assessment & Plan Note (Signed)
Stable period off meds. Will not start ACEI/ARB at this time.

## 2021-01-18 NOTE — Telephone Encounter (Addendum)
Unfortunately I don't know of other med that may be more affordable.  Insulin seems to be in same tier level as ozempic and other medicines tried (GLP1RA, SGLT2i, DPP4i etc). Recommend endocrinology referral for further recommendations, also would want him to check on cost of these medicines through the [place].  Let me know if he agrees with endo referral.

## 2021-01-19 NOTE — Telephone Encounter (Signed)
Patient called and informed about the recommendations from Dr. Sharen Hones, patient stated that he understood and was asking about a spider bite that was itching like crazy and redness. Patient asking what he can do to get it to stop. Please advise.

## 2021-01-20 NOTE — Telephone Encounter (Signed)
Does he agree to endo referral?  For spider bite - would start with OTC cortizone-10 for itch. If redness, may need evaluation to r/o infection.  Can he send a picture through mychart?

## 2021-01-21 NOTE — Telephone Encounter (Signed)
Left message on vm per dpr relaying Dr. Timoteo Expose message.  Asked pt to call back letting us know about endo referral.

## 2021-01-24 ENCOUNTER — Other Ambulatory Visit: Payer: Self-pay | Admitting: Family Medicine

## 2021-01-25 NOTE — Telephone Encounter (Signed)
Tried calling patient he did not answer. LVM for him to call back.

## 2021-01-25 NOTE — Telephone Encounter (Signed)
Patient called back, he stated that the spider bite is healed up and [facility] not gotten [facility] about [facility]. Patient stated his appreciation for the call.

## 2021-03-06 ENCOUNTER — Other Ambulatory Visit: Payer: Self-pay | Admitting: Family Medicine

## 2021-03-15 ENCOUNTER — Other Ambulatory Visit: Payer: Self-pay | Admitting: Family Medicine

## 2021-03-16 NOTE — Telephone Encounter (Signed)
Refill request sildenafil Last refill 01/26/21 #5/2 Last office visit 01/07/21 Upcoming appointment 04/15/21

## 2021-03-17 NOTE — Telephone Encounter (Signed)
ERx 

## 2021-04-14 ENCOUNTER — Other Ambulatory Visit: Payer: Self-pay | Admitting: Family Medicine

## 2021-04-15 ENCOUNTER — Ambulatory Visit: Payer: No Typology Code available for payment source | Admitting: Family Medicine

## 2021-04-24 ENCOUNTER — Other Ambulatory Visit: Payer: Self-pay | Admitting: Family Medicine

## 2021-05-04 ENCOUNTER — Other Ambulatory Visit: Payer: Self-pay | Admitting: Family Medicine

## 2021-06-08 ENCOUNTER — Other Ambulatory Visit: Payer: Self-pay | Admitting: Family Medicine

## 2021-07-15 ENCOUNTER — Other Ambulatory Visit: Payer: Self-pay | Admitting: Family Medicine

## 2021-07-15 NOTE — Telephone Encounter (Signed)
E-scribed pantoprazole refill.  According to 01/07/21 OV notes, pt isn't able to afford Ozempic and was going to check about pt assistance program or pt would be started on insulin.  Lvm asking pt to call back.  Need to find out if pt applied for pt assistance for Ozempic or if he has decided to start insulin.

## 2021-08-12 ENCOUNTER — Telehealth: Payer: Self-pay

## 2021-08-12 NOTE — Telephone Encounter (Signed)
Received PA request from [facility] for sildenafil [Viagra] 100 mg tab; key:  [id number].  Decision pending.

## 2021-10-05 ENCOUNTER — Other Ambulatory Visit: Payer: Self-pay | Admitting: Family Medicine

## 2021-10-05 NOTE — Telephone Encounter (Signed)
Please call patient and schedule follow-up appointment. Patient is overdue a follow-up on diabetes. Send back to CMA after appointment is scheduled. ?

## 2021-10-10 ENCOUNTER — Telehealth: Payer: Self-pay | Admitting: Family Medicine

## 2021-10-10 NOTE — Telephone Encounter (Signed)
Lvm for pt to call to schedule F/U . And sent my chart letter ?

## 2021-10-29 ENCOUNTER — Other Ambulatory Visit: Payer: Self-pay | Admitting: Family Medicine

## 2021-10-31 NOTE — Telephone Encounter (Signed)
Refill request Sildenafil ?Last office visit 01/07/21 ?Last refill 04/26/21  #5/3 ?No upcoming appointment scheduled ?

## 2021-11-06 ENCOUNTER — Emergency Department (HOSPITAL_COMMUNITY)
Admission: EM | Admit: 2021-11-06 | Discharge: 2021-11-06 | Disposition: A | Discharge status: Home / Self Care | Payer: No Typology Code available for payment source | Attending: Emergency Medicine | Admitting: Emergency Medicine

## 2021-11-06 ENCOUNTER — Emergency Department (HOSPITAL_COMMUNITY): Payer: No Typology Code available for payment source

## 2021-11-06 ENCOUNTER — Encounter (HOSPITAL_COMMUNITY): Payer: Self-pay | Admitting: Emergency Medicine

## 2021-11-06 ENCOUNTER — Other Ambulatory Visit: Payer: Self-pay

## 2021-11-06 DIAGNOSIS — Z794 Long term (current) use of insulin: Secondary | ICD-10-CM | POA: Diagnosis not present

## 2021-11-06 DIAGNOSIS — R519 Headache, unspecified: Secondary | ICD-10-CM | POA: Diagnosis not present

## 2021-11-06 DIAGNOSIS — R197 Diarrhea, unspecified: Secondary | ICD-10-CM | POA: Diagnosis not present

## 2021-11-06 DIAGNOSIS — R079 Chest pain, unspecified: Secondary | ICD-10-CM | POA: Insufficient documentation

## 2021-11-06 DIAGNOSIS — R202 Paresthesia of skin: Secondary | ICD-10-CM | POA: Insufficient documentation

## 2021-11-06 LAB — BASIC METABOLIC PANEL
Anion gap: 11 (ref 5–15)
BUN: 15 mg/dL (ref 6–20)
CO2: 18 mmol/L — ABNORMAL LOW (ref 22–32)
Calcium: 8.7 mg/dL — ABNORMAL LOW (ref 8.9–10.3)
Chloride: 107 mmol/L (ref 98–111)
Creatinine, Ser: 0.92 mg/dL (ref 0.61–1.24)
GFR, Estimated: >60 mL/min (ref >60)
Glucose, Bld: 339 mg/dL — ABNORMAL HIGH (ref 70–99)
Potassium: 4.4 mmol/L (ref 3.5–5.1)
Sodium: 136 mmol/L (ref 135–145)

## 2021-11-06 LAB — TROPONIN I (HIGH SENSITIVITY)
Troponin I (High Sensitivity): 7 ng/L (ref <18)
Troponin I (High Sensitivity): 7 ng/L (ref <18)

## 2021-11-06 LAB — CBC WITH DIFFERENTIAL/PLATELET
Abs Immature Granulocytes: 0.01 10*3/uL (ref 0.00–0.07)
Basophils Absolute: 0 10*3/uL (ref 0.0–0.1)
Basophils Relative: 1 %
Eosinophils Absolute: 0.1 10*3/uL (ref 0.0–0.5)
Eosinophils Relative: 2 %
HCT: 46.5 % (ref 39.0–52.0)
Hemoglobin: 15.8 g/dL (ref 13.0–17.0)
Immature Granulocytes: 0 %
Lymphocytes Relative: 39 %
Lymphs Abs: 2.3 10*3/uL (ref 0.7–4.0)
MCH: 31 pg (ref 26.0–34.0)
MCHC: 34 g/dL (ref 30.0–36.0)
MCV: 91.4 fL (ref 80.0–100.0)
Monocytes Absolute: 0.5 10*3/uL (ref 0.1–1.0)
Monocytes Relative: 8 %
Neutro Abs: 2.9 10*3/uL (ref 1.7–7.7)
Neutrophils Relative %: 50 %
Platelets: 216 10*3/uL (ref 150–400)
RBC: 5.09 MIL/uL (ref 4.22–5.81)
RDW: 13.6 % (ref 11.5–15.5)
WBC: 5.8 10*3/uL (ref 4.0–10.5)
nRBC: 0 % (ref 0.0–0.2)

## 2021-11-06 MED ORDER — SODIUM CHLORIDE 0.9 % IV BOLUS
500.0000 mL | Freq: Once | INTRAVENOUS | Status: AC
  Administered 2021-11-06: 500 mL via INTRAVENOUS

## 2021-11-06 NOTE — ED Notes (Signed)
Recollect lt green ?

## 2021-11-06 NOTE — ED Provider Triage Note (Signed)
Emergency Medicine Provider Triage Evaluation Note ? ?Jonathon [name] , a 56 y.o. male  was evaluated in triage.  Pt complains of tingling RUE yesterday upon waking, mid sternal CP. Woke up today with ongoing RUE tingling and now LUE tingling, ongoing CP and a headache. Symptoms constant since yesterday. Worse with - nothing, tried to relax yesterday, no exertional pain.  ?No hx of same previously. No associated SHOB, nausea, diaphoresis. ?Diarrhea 2 days ago.  ? ?Review of Systems  ?Positive: CP, extremity tingling ?Negative: Diaphoresis, SHOB ? ?Physical Exam  ?BP (!) 150/95 (BP Location: Left Arm)   Pulse 84   Temp 98.2 ?F (36.8 ?C) (Oral)   Resp 18   SpO2 99%  ?Gen:   Awake, no distress   ?Resp:  Normal effort  ?MSK:   Moves extremities without difficulty  ?Other:   ? ?Medical Decision Making  ?Medically screening exam initiated at 10:17 AM.  Appropriate orders placed.  Jonathon [name] was informed that the remainder of the evaluation will be completed by another provider, this initial triage assessment does not replace that evaluation, and the importance of remaining in the ED until their evaluation is complete. ? ? ?  ?Jeannie Fend, PA-C ?11/06/21 1019 ? ?

## 2021-11-06 NOTE — ED Provider Notes (Signed)
?[facility]-EMERGENCY DEPT ?Provider Note ? ? ?CSN: [id number] ?Arrival date & time: 11/06/21  2633 ? ?  ? ?History ? ?Chief Complaint  ?Patient presents with  ? Chest Pain  ? ? ?Jonathon [name] is a 56 y.o. male. ? ?56 year old male complains of tingling RUE yesterday upon waking, mid sternal CP. Woke up today with ongoing RUE tingling and now LUE tingling, ongoing CP and a headache. Symptoms constant since yesterday. Worse with - nothing, tried to relax yesterday, no exertional pain. Denies changes in vision, speech, gait. No pain at time of exam, states he did have pain a few minutes ago.  ?No hx of same previously. No associated SHOB, nausea, diaphoresis. ?Diarrhea 2 days ago.  ? ? ?  ? ?Home Medications ?Prior to Admission medications   ?Medication Sig Start Date End Date Taking? Authorizing Provider  ?atorvastatin (LIPITOR) 40 MG tablet Take 1 tablet (40 mg total) by mouth daily. at 6pm 10/01/20   [name], MD  ?clotrimazole (LOTRIMIN) 1 % cream APPLY TO AFFECTED AREA TWICE A DAY 05/24/20   [name], MD  ?glipiZIDE (GLUCOTROL) 10 MG tablet Take 1 tablet (10 mg total) by mouth 2 (two) times daily before lunch and supper. 10/01/20   [name], MD  ?glucose blood (ONE TOUCH ULTRA TEST) test strip Check blood sugar twice a day and as instructed. Dx 250.00 10/01/20   [name], MD  ?metFORMIN (GLUCOPHAGE) 500 MG tablet Take 2 tablets (1,000 mg total) by mouth 2 (two) times daily with a meal. 10/01/20   [name], MD  ?pantoprazole (PROTONIX) 20 MG tablet TAKE 1 TABLET BY MOUTH EVERY DAY 07/15/21   [name], MD  ?pioglitazone (ACTOS) 30 MG tablet Take 1 tablet (30 mg total) by mouth daily. 10/12/20   [name], MD  ?Semaglutide,0.25 or 0.5MG /DOS, (OZEMPIC, 0.25 OR 0.5 MG/DOSE,) 2 MG/1.5ML SOPN Inject 0.5 mg as directed once a week. 07/18/21   [name], MD  ?sildenafil (VIAGRA) 100 MG tablet TAKE 1/2 TO 1 TABLET BY MOUTH DAILY AS NEEDED FOR FOR  ERECTILE DYSFUNCTION 11/01/21   [name], MD  ?tadalafil (CIALIS) 20 MG tablet Take 0.5-1 tablets (10-20 mg total) by mouth every other day as needed for erectile dysfunction. 01/13/20   [name], MD  ?   ? ?Allergies    ?Flexeril [cyclobenzaprine]   ? ?Review of Systems   ?Review of Systems ?Negative except as per HPI ?Physical Exam ?Updated Vital Signs ?BP 136/90   Pulse 83   Temp 97.9 ?F (36.6 ?C) (Oral)   Resp (!) 22   SpO2 100%  ?Physical Exam ?Vitals and nursing note reviewed.  ?Constitutional:   ?   General: He is not in acute distress. ?   Appearance: He is well-developed. He is obese. He is not diaphoretic.  ?HENT:  ?   Head: Normocephalic and atraumatic.  ?Cardiovascular:  ?   Rate and Rhythm: Normal rate and regular rhythm.  ?   Heart sounds: Normal heart sounds. No murmur heard. ?Pulmonary:  ?   Effort: Pulmonary effort is normal.  ?   Breath sounds: Normal breath sounds.  ?Chest:  ?   Chest wall: No tenderness.  ?Abdominal:  ?   Palpations: Abdomen is soft.  ?   Tenderness: There is no abdominal tenderness.  ?Musculoskeletal:  ?   Cervical back: Neck supple.  ?   Right lower leg: No tenderness. No edema.  ?   Left lower leg: No tenderness. No edema.  ?  Skin: ?   General: Skin is warm and dry.  ?   Findings: No erythema or rash.  ?Neurological:  ?   Mental Status: He is alert and oriented to person, place, and time.  ?Psychiatric:     ?   Behavior: Behavior normal.  ? ? ?ED Results / Procedures / Treatments   ?Labs ?(all labs ordered are listed, but only abnormal results are displayed) ?Labs Reviewed  ?BASIC METABOLIC PANEL - Abnormal; Notable for the following components:  ?    Result Value  ? CO2 18 (*)   ? Glucose, Bld 339 (*)   ? Calcium 8.7 (*)   ? All other components within normal limits  ?CBC WITH DIFFERENTIAL/PLATELET  ?TROPONIN I (HIGH SENSITIVITY)  ?TROPONIN I (HIGH SENSITIVITY)  ? ? ?EKG ?EKG Interpretation ? ?Date/Time:  Sunday November 06 2021 10:06:32 EDT ?Ventricular Rate:   84 ?PR Interval:  204 ?QRS Duration: 78 ?QT Interval:  357 ?QTC Calculation: 422 ?R Axis:   -23 ?Text Interpretation: Sinus rhythm Borderline prolonged PR interval Borderline left axis deviation Confirmed by Kristine Royal [phone]) on 11/06/2021 4:20:15 PM ? ?Radiology ?DG Chest 2 View ? ?Result Date: 11/06/2021 ?CLINICAL DATA:  Provided history: Chest pain. EXAM: CHEST - 2 VIEW COMPARISON:  Prior chest radiographs 11/11/2019 and earlier. Chest CT 08/29/2019. FINDINGS: Heart size within normal limits. No appreciable airspace consolidation. No evidence of pleural effusion or pneumothorax. No acute bony abnormality identified. IMPRESSION: No evidence of acute cardiopulmonary abnormality. Electronically Signed   By: Jackey Loge D.O.   On: 11/06/2021 10:53   ? ?Procedures ?Procedures  ? ? ?Medications Ordered in ED ?Medications  ?sodium chloride 0.9 % bolus 500 mL (0 mLs Intravenous Stopped 11/06/21 1504)  ? ? ?ED Course/ Medical Decision Making/ A&P ?  ?                        ?Medical Decision Making ?Amount and/or Complexity of Data Reviewed ?Labs: ordered. ?Radiology: ordered. ? ? ?This patient presents to the ED for concern of tingling int he extremities and chest discomfort, this involves an extensive number of treatment options, and is a complaint that carries with it a high risk of complications and morbidity.  The differential diagnosis includes but not limited to ACS, electrolyte disturbance, arrhythmia  ? ? ?Co morbidities that complicate the patient evaluation ? ?Diabetes, hyperlipidemia, HTN, obesity ? ? ?Additional history obtained: ? ?Additional history obtained from wife at bedside ?External records from outside source obtained and reviewed including echo 05/20/20 EF 55-60% ?06/11/20 NM myocar multi, no ischemic changes ? ? ?Lab Tests: ? ?I Ordered, and personally interpreted labs.  The pertinent results include:  CBC normal, BMP with elevated glucose at 339 (did not take morning meds). Initial troponin  7. ? ? ?Imaging Studies ordered: ? ?I ordered imaging studies including CXR  ?I independently visualized and interpreted imaging which showed no acute abnormality ?I agree with the radiologist interpretation ? ? ?Cardiac Monitoring: / EKG: ? ?The patient was maintained on a cardiac monitor.  I personally viewed and interpreted the cardiac monitored which showed an underlying rhythm of: NSR ? ? ?Problem List / ED Course / Critical interventions / Medication management ? ?56 year old male with chest pain and extremity tingling as above. Exam is unremarkable, EKG without ischemic changes, initial torponin 7, repeat pending (lab delay- insufficient sample received, delay in obtaining initial trop). ?Care signed out pending repeat troponin.  ?I ordered medication including  IVF  for hyperglycemia  ?I have reviewed the patients home medicines and have made adjustments as needed ? ? ?Social Determinants of Health: ? ?Has a PCP ? ? ?Test / Admission - Considered: ? ?Hear score 3, pending repeat troponin.  ? ? ? ? ? ? ? ? ?Final Clinical Impression(s) / ED Diagnoses ?Final diagnoses:  ?Nonspecific chest pain  ? ? ?Rx / DC Orders ?ED Discharge Orders   ? ? None  ? ?  ? ? ?  ?Jeannie FendMurphy, Junious Ragone A, PA-C ?11/06/21 1726 ? ?  ?Wynetta FinesMessick, Peter C, MD ?11/06/21 2346 ? ?

## 2021-11-06 NOTE — ED Notes (Signed)
Attempted to stick the patient twice to draw lab and for unsuccessfully  ?

## 2021-11-06 NOTE — Discharge Instructions (Addendum)
The blood tests for your heart are both normal. Your EKG and chest x-ray also look OK. Please follow-up with your doctor.  ?

## 2021-11-06 NOTE — ED Triage Notes (Signed)
Patient reports yesterday morning waking with tingling in R hand and arm with central chest pain. States this morning waking with tingling in bilateral hands, continued central chest pain, and headache. ?

## 2021-11-10 ENCOUNTER — Encounter: Payer: Self-pay | Admitting: *Deleted

## 2021-11-10 DIAGNOSIS — Z006 Encounter for examination for normal comparison and control in clinical research program: Secondary | ICD-10-CM

## 2021-11-10 NOTE — Research (Signed)
Voice mail left for Jonathon [name] about Haematologist. Encouraged him to call back. ?

## 2021-12-18 ENCOUNTER — Other Ambulatory Visit: Payer: Self-pay | Admitting: Family Medicine

## 2021-12-19 ENCOUNTER — Other Ambulatory Visit: Payer: Self-pay | Admitting: Family Medicine

## 2021-12-21 ENCOUNTER — Other Ambulatory Visit: Payer: Self-pay | Admitting: Family Medicine

## 2021-12-22 NOTE — Telephone Encounter (Signed)
According to CoverMyMeds, [place] approved; valid 07/13/2021- 08/12/2022.

## 2022-01-11 ENCOUNTER — Other Ambulatory Visit: Payer: Self-pay | Admitting: Family Medicine

## 2022-01-11 NOTE — Telephone Encounter (Signed)
Needs annual physical appointment for additional refills. 

## 2022-01-26 ENCOUNTER — Other Ambulatory Visit: Payer: Self-pay | Admitting: Family Medicine

## 2022-01-26 NOTE — Telephone Encounter (Signed)
Spoke with pt scheduling CPE on 01/31/22 at 2:00 and lab visit on 01/27/22 at 8:15.   E-scribed refill.

## 2022-01-27 ENCOUNTER — Other Ambulatory Visit: Payer: Self-pay | Admitting: Family Medicine

## 2022-01-27 ENCOUNTER — Other Ambulatory Visit (INDEPENDENT_AMBULATORY_CARE_PROVIDER_SITE_OTHER): Payer: No Typology Code available for payment source

## 2022-01-27 DIAGNOSIS — E785 Hyperlipidemia, unspecified: Secondary | ICD-10-CM

## 2022-01-27 DIAGNOSIS — E1169 Type 2 diabetes mellitus with other specified complication: Secondary | ICD-10-CM

## 2022-01-27 DIAGNOSIS — E118 Type 2 diabetes mellitus with unspecified complications: Secondary | ICD-10-CM

## 2022-01-27 LAB — COMPREHENSIVE METABOLIC PANEL
ALT: 32 U/L (ref 0–53)
AST: 23 U/L (ref 0–37)
Albumin: 4.3 g/dL (ref 3.5–5.2)
Alkaline Phosphatase: 56 U/L (ref 39–117)
BUN: 19 mg/dL (ref 6–23)
CO2: 25 mEq/L (ref 19–32)
Calcium: 10 mg/dL (ref 8.4–10.5)
Chloride: 103 mEq/L (ref 96–112)
Creatinine, Ser: 1.31 mg/dL (ref 0.40–1.50)
GFR: 60.05 mL/min (ref >60.00)
Glucose, Bld: 203 mg/dL — ABNORMAL HIGH (ref 70–99)
Potassium: 4.5 mEq/L (ref 3.5–5.1)
Sodium: 138 mEq/L (ref 135–145)
Total Bilirubin: 0.5 mg/dL (ref 0.2–1.2)
Total Protein: 7.4 g/dL (ref 6.0–8.3)

## 2022-01-27 LAB — LIPID PANEL
Cholesterol: 226 mg/dL — ABNORMAL HIGH (ref 0–200)
HDL: 31.2 mg/dL — ABNORMAL LOW (ref >39.00)
Total CHOL/HDL Ratio: 7
Triglycerides: 521 mg/dL — ABNORMAL HIGH (ref 0.0–149.0)

## 2022-01-27 LAB — MICROALBUMIN / CREATININE URINE RATIO
Creatinine,U: 67.8 mg/dL
Microalb Creat Ratio: 1 mg/g (ref 0.0–30.0)
Microalb, Ur: <0.7 mg/dL (ref 0.0–1.9)

## 2022-01-27 LAB — HEMOGLOBIN A1C: Hgb A1c MFr Bld: 10.1 % — ABNORMAL HIGH (ref 4.6–6.5)

## 2022-01-27 LAB — LDL CHOLESTEROL, DIRECT: Direct LDL: 85 mg/dL

## 2022-01-31 ENCOUNTER — Encounter: Payer: Self-pay | Admitting: Family Medicine

## 2022-01-31 ENCOUNTER — Ambulatory Visit (INDEPENDENT_AMBULATORY_CARE_PROVIDER_SITE_OTHER): Payer: No Typology Code available for payment source | Admitting: Family Medicine

## 2022-01-31 VITALS — BP 122/82 | HR 109 | Temp 97.3°F | Ht 71.5 in | Wt 292.1 lb

## 2022-01-31 DIAGNOSIS — Z125 Encounter for screening for malignant neoplasm of prostate: Secondary | ICD-10-CM

## 2022-01-31 DIAGNOSIS — E1140 Type 2 diabetes mellitus with diabetic neuropathy, unspecified: Secondary | ICD-10-CM

## 2022-01-31 DIAGNOSIS — R000 Tachycardia, unspecified: Secondary | ICD-10-CM

## 2022-01-31 DIAGNOSIS — Z0001 Encounter for general adult medical examination with abnormal findings: Secondary | ICD-10-CM

## 2022-01-31 DIAGNOSIS — N529 Male erectile dysfunction, unspecified: Secondary | ICD-10-CM

## 2022-01-31 DIAGNOSIS — I10 Essential (primary) hypertension: Secondary | ICD-10-CM | POA: Diagnosis not present

## 2022-01-31 DIAGNOSIS — U099 Post covid-19 condition, unspecified: Secondary | ICD-10-CM

## 2022-01-31 DIAGNOSIS — E785 Hyperlipidemia, unspecified: Secondary | ICD-10-CM

## 2022-01-31 DIAGNOSIS — E118 Type 2 diabetes mellitus with unspecified complications: Secondary | ICD-10-CM

## 2022-01-31 DIAGNOSIS — E1169 Type 2 diabetes mellitus with other specified complication: Secondary | ICD-10-CM

## 2022-01-31 DIAGNOSIS — R0609 Other forms of dyspnea: Secondary | ICD-10-CM

## 2022-01-31 NOTE — Assessment & Plan Note (Signed)
Ongoing since COVID 2021.  H/o DVT at that time, but CTA chest was negative for PE. Should have completed eliquis 39mo course.  Check D dimer at next labs and if abnormal, proceed with CTA.

## 2022-01-31 NOTE — Assessment & Plan Note (Signed)
Reviewed markedly high cholesterol levels predominantly triglycerides assume driven by hyperglycemia, he was also not fasting and had just eaten [employer] breakfast. Will continue atorvastatin 40mg  at this time and reassess control in 6 wks.  The 10-year ASCVD risk score (Arnett DK, et al., 2019) is: 24.1%   Values used to calculate the score:     Age: 56 years     Sex: Male     Is Non-Hispanic African American: Yes     Diabetic: Yes     Tobacco smoker: No     Systolic Blood Pressure: 122 mmHg     Is BP treated: Yes     HDL Cholesterol: 31.2 mg/dL     Total Cholesterol: 226 mg/dL

## 2022-01-31 NOTE — Assessment & Plan Note (Signed)
Continued weight loss noted.  Anticipate ozempic effect.

## 2022-01-31 NOTE — Progress Notes (Signed)
Patient ID: Jonathon [name], male    DOB: 1966/02/05, 56 y.o.   MRN: [id number]  This visit was conducted in person.  BP 122/82   Pulse (!) 109   Temp (!) 97.3 F (36.3 C) (Temporal)   Ht 5' 11.5" (1.816 m)   Wt 292 lb 2 oz (132.5 kg)   SpO2 97%   BMI 40.18 kg/m   Pulse Readings from Last 3 Encounters:  01/31/22 (!) 109  11/06/21 77  01/07/21 89   CC: CPE Subjective:   HPI: Jonathon [name] is a 56 y.o. male presenting on 01/31/2022 for Annual Exam   DM - difficulty affording ozempic until the past 2 months where he got it covered by the [place]. Tolerating well, mild constipation. No nausea or epigastric pain. Continues metformin 1000mg  bid, glipizide 10mg  BID. Checks sugars regularly - morning 120-130. Glucometer brand - one touch.   ED - stendra worked the best  H/o fatty liver by previous imaging at the .   Chronic tachycardia - not drinking coffee. Denies palpitations.  He is on lisinopril 1/2 tab through the .   Preventative: COLONOSCOPY 02/14/2018 - at [facility] per patient - told normal Prostate cancer screening - no nocturia, strong stream. Will continue PSA yearly  Lung cancer screening - not eligible  Flu shot - declines COVID vaccine Pfizer 11/2019 x2, booster 06/2020, bivalent 07/2021 Tdap 2014 Pneumovax - 05/2019 Shingrix at [facility] - 09/2021 pending 2nd Seat belt use discussed Sunscreen use discussed. No changing moles on skin.  Non smoker Alcohol - none - stopped drinking 2016/10/30 Dentist yearly - upcoming  Eye exam - ~09/2021 in [place]  Widower - wife suddenly passed away 31-Oct-2011. New wife.  Has 5 children but none live at home. Occupation: [employer] October 30, 2017)  Edu: HS Activity: walks dog 17mi daily  Diet: good water, some soda, fruits/vegetables      Relevant past medical, surgical, family and social history reviewed and updated as indicated. Interim medical history since our last visit reviewed. Allergies and medications reviewed and updated. Outpatient  Medications Prior to Visit  Medication Sig Dispense Refill   atorvastatin (LIPITOR) 40 MG tablet Take 1 tablet (40 mg total) by mouth daily. at 6pm 90 tablet 3   clotrimazole (LOTRIMIN) 1 % cream APPLY TO AFFECTED AREA TWICE A DAY 60 g 1   glipiZIDE (GLUCOTROL) 10 MG tablet TAKE 1 TABLET (10 MG TOTAL) BY MOUTH 2 (TWO) TIMES DAILY BEFORE LUNCH AND SUPPER. 60 tablet 0   glucose blood (ONE TOUCH ULTRA TEST) test strip Check blood sugar twice a day and as instructed. Dx 250.00 100 each 3   JARDIANCE 25 MG TABS tablet Take by mouth.     lisinopril (ZESTRIL) 10 MG tablet Pt unsure of dose     metFORMIN (GLUCOPHAGE) 500 MG tablet Take 2 tablets (1,000 mg total) by mouth 2 (two) times daily with a meal. 180 tablet 3   pantoprazole (PROTONIX) 20 MG tablet Take 1 tablet (20 mg total) by mouth daily. MUST SCHEDULE OFFICE VISIT 30 tablet 0   Semaglutide,0.25 or 0.5MG /DOS, (OZEMPIC, 0.25 OR 0.5 MG/DOSE,) 2 MG/1.5ML SOPN Inject 0.5 mg as directed once a week. 1.5 mL 5   sildenafil (VIAGRA) 100 MG tablet TAKE ONE-HALF TO ONE TABLET BY MOUTH ONE TIME DAILY AS NEEDED FOR ERECTILE DYSFUNCTION 5 tablet 2   pioglitazone (ACTOS) 30 MG tablet Take 1 tablet (30 mg total) by mouth daily. 90 tablet 1   tadalafil (CIALIS) 20 MG  tablet Take 0.5-1 tablets (10-20 mg total) by mouth every other day as needed for erectile dysfunction. 5 tablet 11   No facility-administered medications prior to visit.     Per HPI unless specifically indicated in ROS section below Review of Systems  Constitutional:  Negative for activity change, appetite change, chills, fatigue, fever and unexpected weight change.  HENT:  Negative for hearing loss.   Eyes:  Negative for visual disturbance.  Respiratory:  Positive for shortness of breath (ongoing since COVID). Negative for cough, chest tightness and wheezing.   Cardiovascular:  Negative for chest pain, palpitations and leg swelling.  Gastrointestinal:  Positive for diarrhea (metformin  related). Negative for abdominal distention, abdominal pain, blood in stool, constipation, nausea and vomiting.  Genitourinary:  Negative for difficulty urinating and hematuria.  Musculoskeletal:  Negative for arthralgias, myalgias and neck pain.  Skin:  Negative for rash.  Neurological:  Negative for dizziness, seizures, syncope and headaches.  Hematological:  Negative for adenopathy. Does not bruise/bleed easily.  Psychiatric/Behavioral:  Negative for dysphoric mood. The patient is nervous/anxious.     Objective:  BP 122/82   Pulse (!) 109   Temp (!) 97.3 F (36.3 C) (Temporal)   Ht 5' 11.5" (1.816 m)   Wt 292 lb 2 oz (132.5 kg)   SpO2 97%   BMI 40.18 kg/m   Wt Readings from Last 3 Encounters:  01/31/22 292 lb 2 oz (132.5 kg)  01/07/21 (!) 301 lb 4 oz (136.6 kg)  10/01/20 (!) 306 lb 3 oz (138.9 kg)      Physical Exam Vitals and nursing note reviewed.  Constitutional:      General: He is not in acute distress.    Appearance: Normal appearance. He is well-developed. He is not ill-appearing.  HENT:     Head: Normocephalic and atraumatic.     Right Ear: Hearing, tympanic membrane, ear canal and external ear normal.     Left Ear: Hearing, tympanic membrane, ear canal and external ear normal.  Eyes:     General: No scleral icterus.    Extraocular Movements: Extraocular movements intact.     Conjunctiva/sclera: Conjunctivae normal.     Pupils: Pupils are equal, round, and reactive to light.  Neck:     Thyroid: No thyroid mass or thyromegaly.  Cardiovascular:     Rate and Rhythm: Normal rate and regular rhythm.     Pulses: Normal pulses.          Radial pulses are 2+ on the right side and 2+ on the left side.     Heart sounds: Normal heart sounds. No murmur heard. Pulmonary:     Effort: Pulmonary effort is normal. No respiratory distress.     Breath sounds: Normal breath sounds. No wheezing, rhonchi or rales.  Abdominal:     General: Bowel sounds are normal. There is no  distension.     Palpations: Abdomen is soft. There is no mass.     Tenderness: There is no abdominal tenderness. There is no guarding or rebound.     Hernia: No hernia is present.  Musculoskeletal:        General: Normal range of motion.     Cervical back: Normal range of motion and neck supple.     Right lower leg: No edema.     Left lower leg: No edema.  Lymphadenopathy:     Cervical: No cervical adenopathy.  Skin:    General: Skin is warm and dry.     Findings:  No rash.  Neurological:     General: No focal deficit present.     Mental Status: He is alert and oriented to person, place, and time.  Psychiatric:        Mood and Affect: Mood normal.        Behavior: Behavior normal.        Thought Content: Thought content normal.        Judgment: Judgment normal.       Results for orders placed or performed in visit on 01/27/22  Microalbumin / creatinine urine ratio  Result Value Ref Range   Microalb, Ur <0.7 0.0 - 1.9 mg/dL   Creatinine,U 60.4 mg/dL   Microalb Creat Ratio 1.0 0.0 - 30.0 mg/g  Hemoglobin A1c  Result Value Ref Range   Hgb A1c MFr Bld 10.1 Repeated and verified X2. (H) 4.6 - 6.5 %  Comprehensive metabolic panel  Result Value Ref Range   Sodium 138 135 - 145 mEq/L   Potassium 4.5 3.5 - 5.1 mEq/L   Chloride 103 96 - 112 mEq/L   CO2 25 19 - 32 mEq/L   Glucose, Bld 203 (H) 70 - 99 mg/dL   BUN 19 6 - 23 mg/dL   Creatinine, Ser 5.40 0.40 - 1.50 mg/dL   Total Bilirubin 0.5 0.2 - 1.2 mg/dL   Alkaline Phosphatase 56 39 - 117 U/L   AST 23 0 - 37 U/L   ALT 32 0 - 53 U/L   Total Protein 7.4 6.0 - 8.3 g/dL   Albumin 4.3 3.5 - 5.2 g/dL   GFR 98.11 >91.47 mL/min   Calcium 10.0 8.4 - 10.5 mg/dL  Lipid panel  Result Value Ref Range   Cholesterol 226 (H) 0 - 200 mg/dL   Triglycerides (H) 0.0 - 149.0 mg/dL    829.5 Triglyceride is over 400; calculations on Lipids are invalid.   HDL 31.20 (L) >39.00 mg/dL   Total CHOL/HDL Ratio 7   LDL cholesterol, direct  Result  Value Ref Range   Direct LDL 85.0 mg/dL   Lab Results  Component Value Date   PSA 0.51 10/01/2020   PSA 1.20 04/18/2019   PSA 1.47 12/20/2018   Assessment & Plan:   Problem List Items Addressed This Visit     Encounter for general adult medical examination with abnormal findings - Primary (Chronic)    Preventative protocols reviewed and updated unless pt declined. Discussed healthy diet and lifestyle.       Type 2 diabetes mellitus with unspecified complications (HCC)    Chronic, uncontrolled.  He just recently got approved by [facility] for ozempic and has since started this. Anticipate improved control with addition of GLP1RA to his regular metformin and glipizide.  Recommend RTC 6 wks lab visit for fructosamine then RTC 3 mo DM f/u office visit.       Relevant Medications   JARDIANCE 25 MG TABS tablet   lisinopril (ZESTRIL) 10 MG tablet   Other Relevant Orders   Fructosamine   Hypertension, essential    Chronic. Pt states he's [facility], unsure dose.       Relevant Medications   lisinopril (ZESTRIL) 10 MG tablet   Other Relevant Orders   CBC with Differential/Platelet   Dyslipidemia associated with type 2 diabetes mellitus (HCC)    Reviewed markedly high cholesterol levels predominantly triglycerides assume driven by hyperglycemia, he was also not fasting and had just eaten [employer] breakfast. Will continue atorvastatin 40mg  at this time and  reassess control in 6 wks.  The 10-year ASCVD risk score (Arnett DK, et al., 2019) is: 24.1%   Values used to calculate the score:     Age: 22 years     Sex: Male     Is Non-Hispanic African American: Yes     Diabetic: Yes     Tobacco smoker: No     Systolic Blood Pressure: 122 mmHg     Is BP treated: Yes     HDL Cholesterol: 31.2 mg/dL     Total Cholesterol: 226 mg/dL       Relevant Medications   JARDIANCE 25 MG TABS tablet   lisinopril (ZESTRIL) 10 MG tablet   Other Relevant Orders   Lipid panel   TSH    Obesity, morbid, BMI 40.0-49.9 (HCC)    Continued weight loss noted.  Anticipate ozempic effect.       Relevant Medications   JARDIANCE 25 MG TABS tablet   ED (erectile dysfunction) of organic origin    Manages with viagra.       Type 2 diabetes mellitus with diabetic neuropathy, unspecified (HCC)   Relevant Medications   JARDIANCE 25 MG TABS tablet   lisinopril (ZESTRIL) 10 MG tablet   Post-COVID chronic dyspnea    Ongoing since COVID 2021.  H/o DVT at that time, but CTA chest was negative for PE. Should have completed eliquis 5mo course.  Check D dimer at next labs and if abnormal, proceed with CTA.       Tachycardia    Persistent over several months. See above.       Other Visit Diagnoses     Special screening for malignant neoplasm of prostate       Relevant Orders   PSA        No orders of the defined types were placed in this encounter.  Orders Placed This Encounter  Procedures   PSA    Standing Status:   Future    Standing Expiration Date:   02/01/2023   Lipid panel    Standing Status:   Future    Standing Expiration Date:   02/01/2023   CBC with Differential/Platelet    Standing Status:   Future    Standing Expiration Date:   02/01/2023   Fructosamine    Standing Status:   Future    Standing Expiration Date:   02/01/2023   TSH    Standing Status:   Future    Standing Expiration Date:   02/01/2023     Patient instructions: We will request records of colonoscopy from [facility].  Let us know where you had diabetic eye exam and we will request records.  Return in 6 weeks for fasting labs - and in 3 months for diabetes follow up visit.  Continue current medicines including ozempic.  Let me know dose of lisinopril you're taking to update your chart.  Heart rate is too high - work on increased water intake, let me know if consistently >100.  Follow up plan: Return in about 3 months (around 05/03/2022) for follow up visit.  [name], MD

## 2022-01-31 NOTE — Assessment & Plan Note (Signed)
Persistent over several months. See above.

## 2022-01-31 NOTE — Assessment & Plan Note (Signed)
Chronic, uncontrolled.  He just recently got approved by [facility] for ozempic and has since started this. Anticipate improved control with addition of GLP1RA to his regular metformin and glipizide.  Recommend RTC 6 wks lab visit for fructosamine then RTC 3 mo DM f/u office visit.

## 2022-01-31 NOTE — Assessment & Plan Note (Signed)
Manages with viagra.

## 2022-01-31 NOTE — Patient Instructions (Addendum)
We will request records of colonoscopy from [facility].  Let us know where you had diabetic eye exam and we will request records.  Return in 4-6 weeks for fasting labs - and in 3 months for diabetes follow up visit.  Continue current medicines including ozempic.  Let me know dose of lisinopril you're taking to update your chart.  Heart rate is too high - work on increased water intake, let me know if consistently >100.  Health Maintenance, Male Adopting a healthy lifestyle and getting preventive care are important in promoting health and wellness. Ask your health care provider about: The right schedule for you to have regular tests and exams. Things you can do on your own to prevent diseases and keep yourself healthy. What should I know about diet, weight, and exercise? Eat a healthy diet  Eat a diet that includes plenty of vegetables, fruits, low-fat dairy products, and lean protein. Do not eat a lot of foods that are high in solid fats, added sugars, or sodium. Maintain a healthy weight Body mass index (BMI) is a measurement that can be used to identify possible weight problems. It estimates body fat based on height and weight. Your health care provider can help determine your BMI and help you achieve or maintain a healthy weight. Get regular exercise Get regular exercise. This is one of the most important things you can do for your health. Most adults should: Exercise for at least 150 minutes each week. The exercise should increase your heart rate and make you sweat (moderate-intensity exercise). Do strengthening exercises at least twice a week. This is in addition to the moderate-intensity exercise. Spend less time sitting. Even light physical activity can be beneficial. Watch cholesterol and blood lipids Have your blood tested for lipids and cholesterol at 57 years of age, then have this test every 5 years. You may need to have your cholesterol levels checked more often if: Your lipid  or cholesterol levels are high. You are older than 58 years of age. You are at high risk for heart disease. What should I know about cancer screening? Many types of cancers can be detected early and may often be prevented. Depending on your health history and family history, you may need to have cancer screening at various ages. This may include screening for: Colorectal cancer. Prostate cancer. Skin cancer. Lung cancer. What should I know about heart disease, diabetes, and high blood pressure? Blood pressure and heart disease High blood pressure causes heart disease and increases the risk of stroke. This is more likely to develop in people who have high blood pressure readings or are overweight. Talk with your health care provider about your target blood pressure readings. Have your blood pressure checked: Every 3-5 years if you are 80-32 years of age. Every year if you are 74 years old or older. If you are between the ages of 37 and 64 and are a current or former smoker, ask your health care provider if you should have a one-time screening for abdominal aortic aneurysm (AAA). Diabetes Have regular diabetes screenings. This checks your fasting blood sugar level. Have the screening done: Once every three years after age 28 if you are at a normal weight and have a low risk for diabetes. More often and at a younger age if you are overweight or have a high risk for diabetes. What should I know about preventing infection? Hepatitis B If you have a higher risk for hepatitis B, you should be screened for  this virus. Talk with your health care provider to find out if you are at risk for hepatitis B infection. Hepatitis C Blood testing is recommended for: Everyone born from [birth year] through 1967. Anyone with known risk factors for hepatitis C. Sexually transmitted infections (STIs) You should be screened each year for STIs, including gonorrhea and chlamydia, if: You are sexually active and are  younger than 58 years of age. You are older than 58 years of age and your health care provider tells you that you are at risk for this type of infection. Your sexual activity has changed since you were last screened, and you are at increased risk for chlamydia or gonorrhea. Ask your health care provider if you are at risk. Ask your health care provider about whether you are at high risk for HIV. Your health care provider may recommend a prescription medicine to help prevent HIV infection. If you choose to take medicine to prevent HIV, you should first get tested for HIV. You should then be tested every 3 months for as long as you are taking the medicine. Follow these instructions at home: Alcohol use Do not drink alcohol if your health care provider tells you not to drink. If you drink alcohol: Limit how much you have to 0-2 drinks a day. Know how much alcohol is in your drink. In the [place] one drink equals one 12 oz bottle of beer (355 mL), one 5 oz glass of wine (148 mL), or one 1 oz glass of hard liquor (44 mL). Lifestyle Do not use any products that contain nicotine or tobacco. These products include cigarettes, chewing tobacco, and vaping devices, such as e-cigarettes. If you need help quitting, ask your health care provider. Do not use street drugs. Do not share needles. Ask your health care provider for help if you need support or information about quitting drugs. General instructions Schedule regular health, dental, and eye exams. Stay current with your vaccines. Tell your health care provider if: You often feel depressed. You have ever been abused or do not feel safe at home. Summary Adopting a healthy lifestyle and getting preventive care are important in promoting health and wellness. Follow your health care provider's instructions about healthy diet, exercising, and getting tested or screened for diseases. Follow your health care provider's instructions on monitoring your  cholesterol and blood pressure. This information is not intended to replace advice given to you by your health care provider. Make sure you discuss any questions you have with your health care provider. Document Revised: 11/22/2020 Document Reviewed: 11/22/2020 Elsevier Patient Education  2023 [employer].

## 2022-01-31 NOTE — Assessment & Plan Note (Signed)
Preventative protocols reviewed and updated unless pt declined. Discussed healthy diet and lifestyle.  

## 2022-01-31 NOTE — Assessment & Plan Note (Signed)
Chronic. Pt states he's [facility], unsure dose.

## 2022-02-03 ENCOUNTER — Encounter: Payer: Self-pay | Admitting: Family Medicine

## 2022-02-06 ENCOUNTER — Encounter: Payer: Self-pay | Admitting: Family Medicine

## 2022-02-18 ENCOUNTER — Other Ambulatory Visit: Payer: Self-pay | Admitting: Family Medicine

## 2022-02-23 ENCOUNTER — Other Ambulatory Visit: Payer: Self-pay | Admitting: Family Medicine

## 2022-03-19 ENCOUNTER — Other Ambulatory Visit: Payer: Self-pay | Admitting: Family Medicine

## 2022-03-21 NOTE — Telephone Encounter (Signed)
E-scribed refill.  Plz schedule 3 mo diabetes f/u after 05/03/22 for additional refills.

## 2022-03-21 NOTE — Telephone Encounter (Signed)
Patient has been scheduled

## 2022-03-21 NOTE — Telephone Encounter (Signed)
Noted  

## 2022-04-04 ENCOUNTER — Other Ambulatory Visit: Payer: Self-pay | Admitting: Family Medicine

## 2022-04-12 ENCOUNTER — Other Ambulatory Visit: Payer: Self-pay | Admitting: Family Medicine

## 2022-04-12 DIAGNOSIS — N529 Male erectile dysfunction, unspecified: Secondary | ICD-10-CM

## 2022-05-05 ENCOUNTER — Ambulatory Visit (INDEPENDENT_AMBULATORY_CARE_PROVIDER_SITE_OTHER): Payer: No Typology Code available for payment source | Admitting: Family Medicine

## 2022-05-05 ENCOUNTER — Encounter: Payer: Self-pay | Admitting: Family Medicine

## 2022-05-05 VITALS — BP 134/74 | HR 113 | Temp 97.8°F | Ht 71.5 in | Wt 295.0 lb

## 2022-05-05 DIAGNOSIS — E1140 Type 2 diabetes mellitus with diabetic neuropathy, unspecified: Secondary | ICD-10-CM | POA: Diagnosis not present

## 2022-05-05 DIAGNOSIS — E118 Type 2 diabetes mellitus with unspecified complications: Secondary | ICD-10-CM

## 2022-05-05 DIAGNOSIS — I10 Essential (primary) hypertension: Secondary | ICD-10-CM

## 2022-05-05 DIAGNOSIS — E1169 Type 2 diabetes mellitus with other specified complication: Secondary | ICD-10-CM

## 2022-05-05 DIAGNOSIS — R000 Tachycardia, unspecified: Secondary | ICD-10-CM

## 2022-05-05 DIAGNOSIS — B353 Tinea pedis: Secondary | ICD-10-CM

## 2022-05-05 LAB — POCT GLYCOSYLATED HEMOGLOBIN (HGB A1C): Hemoglobin A1C: 9.3 % — AB (ref 4.0–5.6)

## 2022-05-05 MED ORDER — METFORMIN HCL 1000 MG PO TABS: 1000.0000 mg | ORAL_TABLET | Freq: Two times a day (BID) | ORAL | 3 refills | Status: DC

## 2022-05-05 NOTE — Progress Notes (Unsigned)
Patient ID: Jonathon [name], male    DOB: [birth year], 56 y.o.   MRN: [id number]  This visit was conducted in person.  BP 134/74   Pulse (!) 113   Temp 97.8 F (36.6 C) (Temporal)   Ht 5' 11.5" (1.816 m)   Wt 295 lb (133.8 kg)   SpO2 97%   BMI 40.57 kg/m   Pulse Readings from Last 3 Encounters:  05/05/22 (!) 113  01/31/22 (!) 109  11/06/21 77    CC: 3 mo DM f/u visit  Subjective:   HPI: Jonathon [name] is a 56 y.o. male presenting on 05/05/2022 for Diabetes (Here for 3 mo f/u.)   Gets jardiance, ozempic through [place].  Just saw yesterday - next appt 9 months  Chronic tachycardia, occasionally has trouble catching breath but no dyspnea or chest pain. This did start after COVID 2020.  Home BP readings [id number] systolic. He continues lisinopril 10mg  daily.  OSA not on CPAP.   DM - does not regularly check sugars. Compliant with antihyperglycemic regimen which includes: glipizide 10mg  BID, metformin 1000mg  BID, ozempic 0.5mg  weekly. Not taking jardiance 25mg  daily. Denies low sugars or hypoglycemic symptoms. Denies paresthesias, blurry vision. Last diabetic eye exam 2018 - DUE. Glucometer brand: one touch - needs new test strips - through the [place]. Last foot exam: 12/2020 - DUE. DSME: through [place]. Lab Results  Component Value Date   HGBA1C 9.3 (A) 05/05/2022   Diabetic Foot Exam - Simple   Simple Foot Form Diabetic Foot exam was performed with the following findings: Yes 05/05/2022 10:13 AM  Visual Inspection See comments: Yes Sensation Testing See comments: Yes Pulse Check Posterior Tibialis and Dorsalis pulse intact bilaterally: Yes Comments Diminished sensation to monofilament L>R Thickened scaly skin to right sole    Lab Results  Component Value Date   MICROALBUR <0.7 01/27/2022        Relevant past medical, surgical, family and social history reviewed and updated as indicated. Interim medical history since our last visit reviewed. Allergies and medications reviewed and  updated. Outpatient Medications Prior to Visit  Medication Sig Dispense Refill   atorvastatin (LIPITOR) 40 MG tablet Take 1 tablet (40 mg total) by mouth daily. at 6pm 90 tablet 3   clotrimazole (LOTRIMIN) 1 % cream APPLY TO AFFECTED AREA TWICE A DAY 60 g 1   glipiZIDE (GLUCOTROL) 10 MG tablet TAKE 1 TABLET (10 MG TOTAL) BY MOUTH 2 (TWO) TIMES DAILY BEFORE LUNCH AND SUPPER. 180 tablet 0   glucose blood (ONE TOUCH ULTRA TEST) test strip Check blood sugar twice a day and as instructed. Dx 250.00 100 each 3   JARDIANCE 25 MG TABS tablet Take by mouth.     lisinopril (ZESTRIL) 10 MG tablet Pt unsure of dose     pantoprazole (PROTONIX) 20 MG tablet Take 1 tablet (20 mg total) by mouth daily. MUST SCHEDULE OFFICE VISIT 30 tablet 0   Semaglutide,0.25 or 0.5MG /DOS, (OZEMPIC, 0.25 OR 0.5 MG/DOSE,) 2 MG/1.5ML SOPN Inject 0.5 mg as directed once a week. 1.5 mL 5   sildenafil (VIAGRA) 100 MG tablet TAKE 1/2 TO 1 TABLET BY MOUTH ONE TIME DAILY AS NEEDED FOR FOR ERECTILE DYSFUNCTION 5 tablet 11   metFORMIN (GLUCOPHAGE) 500 MG tablet Take 2 tablets (1,000 mg total) by mouth 2 (two) times daily with a meal. 180 tablet 3   No facility-administered medications prior to visit.     Per HPI unless specifically indicated in ROS section below Review of Systems  Objective:  BP 134/74   Pulse (!) 113   Temp 97.8 F (36.6 C) (Temporal)   Ht 5' 11.5" (1.816 m)   Wt 295 lb (133.8 kg)   SpO2 97%   BMI 40.57 kg/m   Wt Readings from Last 3 Encounters:  05/05/22 295 lb (133.8 kg)  01/31/22 292 lb 2 oz (132.5 kg)  01/07/21 (!) 301 lb 4 oz (136.6 kg)      Physical Exam Vitals and nursing note reviewed.  Constitutional:      Appearance: Normal appearance. He is not ill-appearing.  Eyes:     Extraocular Movements: Extraocular movements intact.     Conjunctiva/sclera: Conjunctivae normal.     Pupils: Pupils are equal, round, and reactive to light.  Cardiovascular:     Rate and Rhythm: Regular rhythm.  Tachycardia present.     Pulses: Normal pulses.     Heart sounds: Normal heart sounds. No murmur heard. Pulmonary:     Effort: Pulmonary effort is normal. No respiratory distress.     Breath sounds: Normal breath sounds. No wheezing, rhonchi or rales.  Musculoskeletal:     Right lower leg: No edema.     Left lower leg: No edema.     Comments: See HPI for foot exam if done  Skin:    General: Skin is warm and dry.     Findings: Rash (R sole) present. Rash is scaling.  Neurological:     Mental Status: He is alert.  Psychiatric:        Mood and Affect: Mood normal.        Behavior: Behavior normal.       Results for orders placed or performed in visit on 05/05/22  POCT glycosylated hemoglobin (Hb A1C)  Result Value Ref Range   Hemoglobin A1C 9.3 (A) 4.0 - 5.6 %   HbA1c POC (<> result, manual entry)     HbA1c, POC (prediabetic range)     HbA1c, POC (controlled diabetic range)     EKG - sinus tachycardia rate 110s, normal axis, intervals, no acute ST/T changes  Assessment & Plan:   Problem List Items Addressed This Visit     Type 2 diabetes mellitus with other specified complication ([place]) - Primary    Chronic, improved but remains uncontrolled. I asked him to reach out to [place] about increase dose of ozempic to 1mg  weekly, as well as to inquire about jardiance as he states he's not been receiving this medication. I also sent in 1000mg  tablets metformin to take 1 tab BID (was taking 500mg  2 tab bid).  I will also refer back to ophthalmology [facility] as pt due for diabetic eye exam.  I asked him to let me know which glucometer brand his insurance prefers.  RTC 3 mo DM f/u visit.       Relevant Medications   metFORMIN (GLUCOPHAGE) 1000 MG tablet   Other Relevant Orders   POCT glycosylated hemoglobin (Hb A1C) (Completed)   Ambulatory referral to Ophthalmology   Hypertension, essential    Chronic, well controlled on lisinopril 10mg . Consider BB pending EKG today.       Relevant Medications   metoprolol succinate (TOPROL-XL) 25 MG 24 hr tablet   Tinea pedis    R sided. Recommend restart topical antifungal cream provided by [facility] for 3-4 wks, update with effect. If not improved consider oral antifungal (caution with liver)       Type 2 diabetes mellitus with diabetic neuropathy, unspecified ([place])    Noted on  exam today.       Relevant Medications   metFORMIN (GLUCOPHAGE) 1000 MG tablet   Other Relevant Orders   POCT glycosylated hemoglobin (Hb A1C) (Completed)   Tachycardia    Ongoing tachycardia for several months now.  Check EKG, likely start toprol XL 25mg  daily.       Relevant Orders   EKG 12-Lead (Completed)     Meds ordered this encounter  Medications   metFORMIN (GLUCOPHAGE) 1000 MG tablet    Sig: Take 1 tablet (1,000 mg total) by mouth 2 (two) times daily with a meal.    Dispense:  180 tablet    Refill:  3   metoprolol succinate (TOPROL-XL) 25 MG 24 hr tablet    Sig: Take 0.5 tablets (12.5 mg total) by mouth daily.    Dispense:  45 tablet    Refill:  3   Orders Placed This Encounter  Procedures   Ambulatory referral to Ophthalmology    Referral Priority:   Routine    Referral Type:   Consultation    Referral Reason:   Specialty Services Required    Requested Specialty:   Ophthalmology    Number of Visits Requested:   1   POCT glycosylated hemoglobin (Hb A1C)   EKG 12-Lead     Patient Instructions  EKG today. If heart rate staying elevated, we may add medicine to slow down the heart.  Metformin changed to 1000mg  tablets - take twice daily.  See if [employer] may cover glucose meter and I can send preferred brand test strips.  Your A1c was 9.3% today - too high. Reach out to [place] to ask about increasing dose to 1mg  weekly. Also ask about jardiance pills.  Schedule eye doctor appointment. Referral placed to [facility] [phone]. [facility]. Let us know if this doesn't help right foot rash likely  athelete's foot.  Return in 3 months for diabetes check.  Follow up plan: Return in about 3 months (around 08/05/2022), or if symptoms worsen or fail to improve, for follow up visit.  Ria Bush, MD

## 2022-05-05 NOTE — Patient Instructions (Addendum)
EKG today. If heart rate staying elevated, we may add medicine to slow down the heart.  Metformin changed to 1000mg  tablets - take twice daily.  See if [employer] may cover glucose meter and I can send preferred brand test strips.  Your A1c was 9.3% today - too high. Reach out to [place] to ask about increasing dose to 1mg  weekly. Also ask about jardiance pills.  Schedule eye doctor appointment. Referral placed to [facility] [phone]. Use antifungal cream [facility] daily. Let us know if this doesn't help right foot rash likely athelete's foot.  Return in 3 months for diabetes check.

## 2022-05-07 MED ORDER — METOPROLOL SUCCINATE ER 25 MG PO TB24: 12.5000 mg | ORAL_TABLET | Freq: Every day | ORAL | 3 refills | Status: DC

## 2022-05-07 NOTE — Assessment & Plan Note (Addendum)
R sided. Recommend restart topical antifungal cream provided by [facility] for 3-4 wks, update with effect. If not improved consider oral antifungal (caution with liver)

## 2022-05-07 NOTE — Assessment & Plan Note (Signed)
Chronic, well controlled on lisinopril 10mg . Consider BB pending EKG today.

## 2022-05-07 NOTE — Assessment & Plan Note (Signed)
Ongoing tachycardia for several months now.  Check EKG, likely start toprol XL 25mg  daily.

## 2022-05-07 NOTE — Assessment & Plan Note (Signed)
Noted on exam today.  

## 2022-05-07 NOTE — Assessment & Plan Note (Addendum)
Chronic, improved but remains uncontrolled. I asked him to reach out to [place] about increase dose of ozempic to 1mg  weekly, as well as to inquire about jardiance as he states he's not been receiving this medication. I also sent in 1000mg  tablets metformin to take 1 tab BID (was taking 500mg  2 tab bid).  I will also refer back to ophthalmology [facility] as pt due for diabetic eye exam.  I asked him to let me know which glucometer brand his insurance prefers.  RTC 3 mo DM f/u visit.

## 2022-05-12 ENCOUNTER — Encounter: Payer: Self-pay | Admitting: *Deleted

## 2022-07-04 ENCOUNTER — Other Ambulatory Visit: Payer: Self-pay | Admitting: Family Medicine

## 2022-07-04 DIAGNOSIS — E1140 Type 2 diabetes mellitus with diabetic neuropathy, unspecified: Secondary | ICD-10-CM

## 2022-07-27 ENCOUNTER — Encounter: Payer: Self-pay | Admitting: Cardiovascular Disease

## 2022-07-27 ENCOUNTER — Ambulatory Visit
Payer: No Typology Code available for payment source | Attending: Cardiovascular Disease | Admitting: Cardiovascular Disease

## 2022-08-11 ENCOUNTER — Ambulatory Visit (INDEPENDENT_AMBULATORY_CARE_PROVIDER_SITE_OTHER): Payer: No Typology Code available for payment source | Admitting: Family Medicine

## 2022-08-11 ENCOUNTER — Encounter: Payer: Self-pay | Admitting: Family Medicine

## 2022-08-11 VITALS — BP 126/72 | HR 93 | Temp 97.6°F | Ht 71.5 in | Wt 287.0 lb

## 2022-08-11 DIAGNOSIS — R000 Tachycardia, unspecified: Secondary | ICD-10-CM

## 2022-08-11 DIAGNOSIS — E1140 Type 2 diabetes mellitus with diabetic neuropathy, unspecified: Secondary | ICD-10-CM | POA: Diagnosis not present

## 2022-08-11 DIAGNOSIS — Z125 Encounter for screening for malignant neoplasm of prostate: Secondary | ICD-10-CM | POA: Diagnosis not present

## 2022-08-11 DIAGNOSIS — K219 Gastro-esophageal reflux disease without esophagitis: Secondary | ICD-10-CM | POA: Insufficient documentation

## 2022-08-11 DIAGNOSIS — E1169 Type 2 diabetes mellitus with other specified complication: Secondary | ICD-10-CM | POA: Diagnosis not present

## 2022-08-11 LAB — CBC WITH DIFFERENTIAL/PLATELET
Basophils Absolute: 0.1 10*3/uL (ref 0.0–0.1)
Basophils Relative: 0.9 % (ref 0.0–3.0)
Eosinophils Absolute: 0.1 10*3/uL (ref 0.0–0.7)
Eosinophils Relative: 1.1 % (ref 0.0–5.0)
HCT: 49.7 % (ref 39.0–52.0)
Hemoglobin: 16.9 g/dL (ref 13.0–17.0)
Lymphocytes Relative: 32.2 % (ref 12.0–46.0)
Lymphs Abs: 2.2 10*3/uL (ref 0.7–4.0)
MCHC: 34 g/dL (ref 30.0–36.0)
MCV: 90.8 fl (ref 78.0–100.0)
Monocytes Absolute: 0.5 10*3/uL (ref 0.1–1.0)
Monocytes Relative: 7.1 % (ref 3.0–12.0)
Neutro Abs: 4 10*3/uL (ref 1.4–7.7)
Neutrophils Relative %: 58.7 % (ref 43.0–77.0)
Platelets: 283 10*3/uL (ref 150.0–400.0)
RBC: 5.47 Mil/uL (ref 4.22–5.81)
RDW: 14.8 % (ref 11.5–15.5)
WBC: 6.8 10*3/uL (ref 4.0–10.5)

## 2022-08-11 LAB — COMPREHENSIVE METABOLIC PANEL
ALT: 24 U/L (ref 0–53)
AST: 18 U/L (ref 0–37)
Albumin: 4.3 g/dL (ref 3.5–5.2)
Alkaline Phosphatase: 85 U/L (ref 39–117)
BUN: 16 mg/dL (ref 6–23)
CO2: 22 mEq/L (ref 19–32)
Calcium: 9.6 mg/dL (ref 8.4–10.5)
Chloride: 102 mEq/L (ref 96–112)
Creatinine, Ser: 1.11 mg/dL (ref 0.40–1.50)
GFR: 72.99 mL/min (ref >60.00)
Glucose, Bld: 203 mg/dL — ABNORMAL HIGH (ref 70–99)
Potassium: 4.3 mEq/L (ref 3.5–5.1)
Sodium: 140 mEq/L (ref 135–145)
Total Bilirubin: 0.5 mg/dL (ref 0.2–1.2)
Total Protein: 7.7 g/dL (ref 6.0–8.3)

## 2022-08-11 LAB — PSA: PSA: 0.87 ng/mL (ref 0.10–4.00)

## 2022-08-11 LAB — POCT GLYCOSYLATED HEMOGLOBIN (HGB A1C): Hemoglobin A1C: 11.2 % — AB (ref 4.0–5.6)

## 2022-08-11 LAB — TSH: TSH: 1.64 u[IU]/mL (ref 0.35–5.50)

## 2022-08-11 MED ORDER — GLIPIZIDE 10 MG PO TABS: 10.0000 mg | ORAL_TABLET | Freq: Two times a day (BID) | ORAL | 2 refills | Status: DC

## 2022-08-11 NOTE — Patient Instructions (Addendum)
[facility].  Talk with [facility] about [facility] dosing at upcoming appointment.  Continue other medications as up to now.  Goal fasting sugars 80-120. Goal sugar 2 hours after meal <180.  Return in 3 months for diabetes follow up visit.

## 2022-08-11 NOTE — Assessment & Plan Note (Signed)
Rare ppi use -receives this through the [place]

## 2022-08-11 NOTE — Assessment & Plan Note (Signed)
Chronic, remains uncontrolled despite noted improvement in cbg readings over the past month - anticipate holiday hyperglycemia contributing to high A1c.  Will check fructosamine levels today for 6 wk average. He is already max dose of metformin, jardiance, glipizide - rec touch base with [facility] about increasing ozempic dose.  RTC 3 mo DM f/u visit per pt request

## 2022-08-11 NOTE — Progress Notes (Signed)
Patient ID: Jonathon [name], male    DOB: 02-12-1966, 57 y.o.   MRN: [id number]  This visit was conducted in person.  BP 126/72   Pulse 93   Temp 97.6 F (36.4 C) (Temporal)   Ht 5' 11.5" (1.816 m)   Wt 287 lb (130.2 kg)   SpO2 96%   BMI 39.47 kg/m    CC: 3 mo DM f/u visit  Subjective:   HPI: Jonathon [name] is a 57 y.o. male presenting on 08/11/2022 for Medical Management of Chronic Issues (Here for 3 mo DM f/u.)   He regularly sees the [place]. [facility].  He gets jardiance and ozempic and pantoprazole through the [place] Never started Toprol XL.   DM - does regularly check sugars every other day - running 140-160s fasting, evening 200s. Compliant with antihyperglycemic regimen which includes: jardiance 25mg  daily, glipizide 10mg  bid, metformin 1000mg  bid, ozempic 0.5mg  weekly. Tolerating ozempic well without nausea, diarrhea, constipation or epigastric pain. Denies low sugars or hypoglycemic symptoms. Denies blurry vision. Notes paresthesias to toes. Last diabetic eye exam DUE - missed last appt (through the ). Glucometer brand: one-touch. Last foot exam: 04/2022. DSME: through the . Exercise - walking on campus about 10 min daily.  Lab Results  Component Value Date   HGBA1C 11.2 (A) 08/11/2022   Diabetic Foot Exam - Simple   No data filed    Lab Results  Component Value Date   MICROALBUR <0.7 01/27/2022     GERD - managed with pantoprazole 20mg  PRN - not regularly using.      Relevant past medical, surgical, family and social history reviewed and updated as indicated. Interim medical history since our last visit reviewed. Allergies and medications reviewed and updated. Outpatient Medications Prior to Visit  Medication Sig Dispense Refill   atorvastatin (LIPITOR) 40 MG tablet Take 1 tablet (40 mg total) by mouth daily. at 6pm 90 tablet 3   clotrimazole (LOTRIMIN) 1 % cream APPLY TO AFFECTED AREA TWICE A DAY 60 g 1   glucose blood (ONE TOUCH ULTRA TEST)  test strip Check blood sugar twice a day and as instructed. Dx 250.00 100 each 3   JARDIANCE 25 MG TABS tablet Take by mouth.     lisinopril (ZESTRIL) 10 MG tablet Pt unsure of dose     metFORMIN (GLUCOPHAGE) 1000 MG tablet Take 1 tablet (1,000 mg total) by mouth 2 (two) times daily with a meal. 180 tablet 3   Semaglutide,0.25 or 0.5MG /DOS, (OZEMPIC, 0.25 OR 0.5 MG/DOSE,) 2 MG/1.5ML SOPN Inject 0.5 mg as directed once a week. 1.5 mL 5   sildenafil (VIAGRA) 100 MG tablet TAKE 1/2 TO 1 TABLET BY MOUTH ONE TIME DAILY AS NEEDED FOR FOR ERECTILE DYSFUNCTION 5 tablet 11   glipiZIDE (GLUCOTROL) 10 MG tablet TAKE 1 TABLET (10 MG TOTAL) BY MOUTH 2 (TWO) TIMES DAILY BEFORE LUNCH AND SUPPER. 180 tablet 0   metoprolol succinate (TOPROL-XL) 25 MG 24 hr tablet Take 0.5 tablets (12.5 mg total) by mouth daily. 45 tablet 3   pantoprazole (PROTONIX) 20 MG tablet Take 1 tablet (20 mg total) by mouth daily. MUST SCHEDULE OFFICE VISIT 30 tablet 0   pantoprazole (PROTONIX) 20 MG tablet Take 1 tablet (20 mg total) [facility] daily. [facility]     No facility-administered medications prior to visit.     Per HPI unless specifically indicated in ROS section below Review of Systems  Objective:  BP 126/72   Pulse  93   Temp 97.6 F (36.4 C) (Temporal)   Ht 5' 11.5" (1.816 m)   Wt 287 lb (130.2 kg)   SpO2 96%   BMI 39.47 kg/m   Wt Readings from Last 3 Encounters:  08/11/22 287 lb (130.2 kg)  05/05/22 295 lb (133.8 kg)  01/31/22 292 lb 2 oz (132.5 kg)      Physical Exam Vitals and nursing note reviewed.  Constitutional:      Appearance: Normal appearance. He is not ill-appearing.  HENT:     Head: Normocephalic and atraumatic.     Mouth/Throat:     Mouth: Mucous membranes are moist.     Pharynx: Oropharynx is clear. No oropharyngeal exudate or posterior oropharyngeal erythema.  Eyes:     Extraocular Movements: Extraocular movements intact.     Conjunctiva/sclera: Conjunctivae normal.     Pupils: Pupils  are equal, round, and reactive to light.  Neck:     Thyroid: No thyroid mass or thyromegaly.  Cardiovascular:     Rate and Rhythm: Normal rate and regular rhythm.     Pulses: Normal pulses.     Heart sounds: Normal heart sounds. No murmur heard. Pulmonary:     Effort: Pulmonary effort is normal. No respiratory distress.     Breath sounds: Normal breath sounds. No wheezing, rhonchi or rales.  Musculoskeletal:     Cervical back: Normal range of motion and neck supple. No rigidity.     Right lower leg: No edema.     Left lower leg: No edema.     Comments: See HPI for foot exam if done  Lymphadenopathy:     Cervical: No cervical adenopathy.  Skin:    General: Skin is warm and dry.     Findings: No rash.  Neurological:     Mental Status: He is alert.  Psychiatric:        Mood and Affect: Mood normal.        Behavior: Behavior normal.       Results for orders placed or performed in visit on 08/11/22  POCT glycosylated hemoglobin (Hb A1C)  Result Value Ref Range   Hemoglobin A1C 11.2 (A) 4.0 - 5.6 %   HbA1c POC (<> result, manual entry)     HbA1c, POC (prediabetic range)     HbA1c, POC (controlled diabetic range)      Assessment & Plan:   Problem List Items Addressed This Visit     Type 2 diabetes mellitus with other specified complication ([place]) - Primary    Chronic, remains uncontrolled despite noted improvement in cbg readings over the past month - anticipate holiday hyperglycemia contributing to high A1c.  Will check fructosamine levels today for 6 wk average. He is already max dose of metformin, jardiance, glipizide - rec touch base with [facility] about increasing ozempic dose.  RTC 3 mo DM f/u visit per pt request       Relevant Medications   glipiZIDE (GLUCOTROL) 10 MG tablet   Other Relevant Orders   POCT glycosylated hemoglobin (Hb A1C) (Completed)   Comprehensive metabolic panel   Fructosamine   Type 2 diabetes mellitus with diabetic neuropathy, unspecified (HCC)    Relevant Medications   glipiZIDE (GLUCOTROL) 10 MG tablet   Tachycardia    Previously Toprol XL 12.5mg  started for tachycardia/hypertension - he never started this, BP and HR stable. Will remain off.       Relevant Orders   CBC with Differential/Platelet   TSH   GERD (gastroesophageal  reflux disease)    Rare ppi use -receives this through the [place]      Relevant Medications   pantoprazole (PROTONIX) 20 MG tablet   Other Visit Diagnoses     Special screening for malignant neoplasm of prostate       Relevant Orders   PSA        Meds ordered this encounter  Medications   glipiZIDE (GLUCOTROL) 10 MG tablet    Sig: Take 1 tablet (10 mg total) by mouth 2 (two) times daily before a meal.    Dispense:  180 tablet    Refill:  2    Orders Placed This Encounter  Procedures   Comprehensive metabolic panel   CBC with Differential/Platelet   PSA   TSH   Fructosamine   POCT glycosylated hemoglobin (Hb A1C)    Patient Instructions  [facility].  Talk with [facility] doc about ozempic dosing at upcoming appointment.  Continue other medications as up to now.  Goal fasting sugars 80-120. Goal sugar 2 hours after meal <180.  Return in 3 months for diabetes follow up visit.  Follow up plan: Return in about 3 months (around 11/10/2022) for follow up visit.  Ria Bush, MD

## 2022-08-11 NOTE — Assessment & Plan Note (Signed)
Previously Toprol XL 12.5mg  started for tachycardia/hypertension - he never started this, BP and HR stable. Will remain off.

## 2022-08-14 ENCOUNTER — Other Ambulatory Visit (HOSPITAL_COMMUNITY): Payer: Self-pay

## 2022-08-14 ENCOUNTER — Telehealth: Payer: Self-pay

## 2022-08-14 LAB — FRUCTOSAMINE: Fructosamine: 466 umol/L — ABNORMAL HIGH (ref 205–285)

## 2022-08-14 NOTE — Telephone Encounter (Signed)
Pharmacy Patient Advocate Encounter  Receive a PA request and ran test claim and received a Paid Claim but when I called the pharmacy to re run it they still was getting need a PA. Pharmacy said they were going to call insurance and see what was going on. We will follow up with pharmacy.   Sandre [facility] Patient Advocate

## 2022-08-15 ENCOUNTER — Other Ambulatory Visit (HOSPITAL_COMMUNITY): Payer: Self-pay

## 2022-08-15 ENCOUNTER — Telehealth: Payer: Self-pay

## 2022-08-15 NOTE — Telephone Encounter (Signed)
Pharmacy Patient Advocate Encounter   Received notification from [facility] that prior authorization for Sildenafil 100mg  tabs is required/requested.  Per Test Claim: Prior auth needed   PA submitted on 08/15/22 to (ins) [employer] via [employer] - PA Case ID: [id number]  Status is APPROVED  Approved from 07/16/22 to 08/15/23  Placed a call to the pharmacy to notify of the approval.

## 2022-09-04 ENCOUNTER — Other Ambulatory Visit: Payer: Self-pay | Admitting: Family Medicine

## 2022-09-04 DIAGNOSIS — N529 Male erectile dysfunction, unspecified: Secondary | ICD-10-CM

## 2022-09-05 NOTE — Telephone Encounter (Signed)
Too soon. Rx sent on 04/13/22, #5/11 to [facility].  Request denied.

## 2022-09-05 NOTE — Telephone Encounter (Signed)
Too soon. Rx sent on 04/13/22, #5/11 to [facility].   Duplicate request denied.

## 2022-09-06 ENCOUNTER — Other Ambulatory Visit: Payer: Self-pay | Admitting: Family Medicine

## 2022-09-06 DIAGNOSIS — N529 Male erectile dysfunction, unspecified: Secondary | ICD-10-CM

## 2022-09-06 NOTE — Telephone Encounter (Signed)
Per pt's chart rx sent 04/13/22, #5/11 to [facility] and shows confirmation rx was received.   Spoke with pharmacy asking about rx. States it shows rx was entered in their system incorrectly. Mistake will be corrected and rx will be filled today.

## 2022-09-08 NOTE — Telephone Encounter (Signed)
Too soon. Rx sent 04/13/22, #5/11 to [facility].   Request denied.

## 2022-09-20 ENCOUNTER — Encounter: Payer: Self-pay | Admitting: *Deleted

## 2022-09-20 ENCOUNTER — Encounter: Payer: Self-pay | Admitting: Family Medicine

## 2022-09-20 ENCOUNTER — Ambulatory Visit (INDEPENDENT_AMBULATORY_CARE_PROVIDER_SITE_OTHER): Payer: No Typology Code available for payment source | Admitting: Family Medicine

## 2022-09-20 VITALS — BP 132/82 | HR 113 | Temp 97.8°F | Ht 71.5 in | Wt 288.1 lb

## 2022-09-20 DIAGNOSIS — H547 Unspecified visual loss: Secondary | ICD-10-CM | POA: Insufficient documentation

## 2022-09-20 DIAGNOSIS — E1169 Type 2 diabetes mellitus with other specified complication: Secondary | ICD-10-CM | POA: Diagnosis not present

## 2022-09-20 DIAGNOSIS — R519 Headache, unspecified: Secondary | ICD-10-CM | POA: Diagnosis not present

## 2022-09-20 NOTE — Assessment & Plan Note (Addendum)
New onset R>L frontal and periorbital headache, intermittent.  Non focal neurologic exam today.  Check labwork. Failed vision screen on right - will refer to ophthalmology. He is unsure who he's previously seen but states had beginnings of diabetic retinopathy.  Will check head CT as well given new onset headache.  Update with results.  Discussed possible sleep apnea causing headache.

## 2022-09-20 NOTE — Patient Instructions (Addendum)
Labs today  I will order head CT for further evaluation of new onset headache. Call imaging center at [place] to schedule appointment. Eye exam today. Schedule eye doctor appointment.  Pending results we may refer you to sleep doctor.

## 2022-09-20 NOTE — Assessment & Plan Note (Signed)
Refer to eye doctor urgently

## 2022-09-20 NOTE — Progress Notes (Addendum)
Patient ID: Jonathon [name], male    DOB: 12-18-1965, 57 y.o.   MRN: [id number]  This visit was conducted in person.  BP 132/82   Pulse (!) 113   Temp 97.8 F (36.6 C) (Temporal)   Ht 5' 11.5" (1.816 m)   Wt 288 lb 2 oz (130.7 kg)   SpO2 97%   BMI 39.63 kg/m   BP Readings from Last 3 Encounters:  09/20/22 132/82  08/11/22 126/72  05/05/22 134/74     Pulse Readings from Last 3 Encounters:  09/20/22 (!) 113  08/11/22 93  05/05/22 (!) 113    Vision Screening   Right eye Left eye Both eyes  Without correction '20/70 20/40 20/40 '$  With correction        CC: headaches Subjective:   HPI: Jonathon [name] is a 57 y.o. male presenting on 09/20/2022 for Headache (C/o migraines. Started 4-5 wks ago. Increasing in occurrence.  )   4-5 wk h/o bilateral R>L frontal headache described as dull throbbing pain can be severe and associated with dizziness. This happens about 3-4 times a week. Activity limiting - has to lay down. Trouble functioning at work. Trouble sleeping due to pain. Chronic tinnitus present since 1988, noticing it's worsening and more intense over the past month. Notes blurry vision. Nauseated with this. Situational depressed mood due to headaches. No occipital or cervical neck pain. Denies inciting falls, injury, trauma. + morning headache, but headache doesn't wake him up.   Last saw eye doctor 2023.   No vomiting. No photo/phonophobia. No temporal pain, jaw pain.  No slurred speech, unilateral numbness/weakness, double vision, paresthesias, facial droop.  No h/o migraines.   Hasn't tried anything for this yet.  H/o central and obstructive sleep apnea saw pulm last 03/2020 - did not start treatment.  HST 02/11/20- mild complex(central and obstructive) sleep apnea, AHI 11.9/ hr, desaturation to 80%, body weight 296 lbs   Known diabetic on jardiance '25mg'$  daily, glipizide '10mg'$  bid with meals, metformin '1000mg'$  bid and ozempic 0.'5mg'$  weekly.  Home sugar readings random 150-210.   Last fructosamine equivalent to A1c 11.7% (07/2022).  Lab Results  Component Value Date   HGBA1C 11.2 (A) 08/11/2022     ED - he takes viagra '100mg'$  PRN     Relevant past medical, surgical, family and social history reviewed and updated as indicated. Interim medical history since our last visit reviewed. Allergies and medications reviewed and updated. Outpatient Medications Prior to Visit  Medication Sig Dispense Refill   atorvastatin (LIPITOR) 40 MG tablet Take 1 tablet (40 mg total) by mouth daily. at 6pm 90 tablet 3   clotrimazole (LOTRIMIN) 1 % cream APPLY TO AFFECTED AREA TWICE A DAY 60 g 1   glipiZIDE (GLUCOTROL) 10 MG tablet Take 1 tablet (10 mg total) by mouth 2 (two) times daily before a meal. 180 tablet 2   glucose blood (ONE TOUCH ULTRA TEST) test strip Check blood sugar twice a day and as instructed. Dx 250.00 100 each 3   JARDIANCE 25 MG TABS tablet Take by mouth.     lisinopril (ZESTRIL) 10 MG tablet Pt unsure of dose     metFORMIN (GLUCOPHAGE) 1000 MG tablet Take 1 tablet (1,000 mg total) by mouth 2 (two) times daily with a meal. 180 tablet 3   pantoprazole (PROTONIX) 20 MG tablet Take 1 tablet (20 mg total) [facility] daily. [facility]     Semaglutide,0.25 or 0.'5MG'$ /DOS, (OZEMPIC, 0.25 OR 0.5 MG/DOSE,) 2 MG/1.5ML SOPN Inject  0.5 mg as directed once a week. 1.5 mL 5   sildenafil (VIAGRA) 100 MG tablet TAKE 1/2 TO 1 TABLET BY MOUTH ONE TIME DAILY AS NEEDED FOR FOR ERECTILE DYSFUNCTION 5 tablet 11   No facility-administered medications prior to visit.     Per HPI unless specifically indicated in ROS section below Review of Systems  Objective:  BP 132/82   Pulse (!) 113   Temp 97.8 F (36.6 C) (Temporal)   Ht 5' 11.5" (1.816 m)   Wt 288 lb 2 oz (130.7 kg)   SpO2 97%   BMI 39.63 kg/m   Wt Readings from Last 3 Encounters:  09/20/22 288 lb 2 oz (130.7 kg)  08/11/22 287 lb (130.2 kg)  05/05/22 295 lb (133.8 kg)      Physical Exam Vitals and nursing note reviewed.   Constitutional:      Appearance: Normal appearance. He is not ill-appearing.  HENT:     Head: Normocephalic and atraumatic.     Comments:  No temporal pain to palpation No jaw claudication ie no pain with chewing     Right Ear: Tympanic membrane, ear canal and external ear normal. There is no impacted cerumen.     Left Ear: Tympanic membrane, ear canal and external ear normal. There is no impacted cerumen.     Ears:     Comments: Cerumen in right canal but able to visualize pearly grey TM    Nose: Nose normal.     Mouth/Throat:     Mouth: Mucous membranes are moist.     Pharynx: Oropharynx is clear. No oropharyngeal exudate or posterior oropharyngeal erythema.  Eyes:     General:        Right eye: No discharge.        Left eye: No discharge.     Extraocular Movements: Extraocular movements intact.     Conjunctiva/sclera: Conjunctivae normal.     Pupils: Pupils are equal, round, and reactive to light.  Cardiovascular:     Rate and Rhythm: Normal rate and regular rhythm.     Pulses: Normal pulses.     Heart sounds: Normal heart sounds. No murmur heard. Pulmonary:     Effort: Pulmonary effort is normal. No respiratory distress.     Breath sounds: Normal breath sounds. No wheezing, rhonchi or rales.  Skin:    General: Skin is warm and dry.     Findings: No rash.  Neurological:     General: No focal deficit present.     Mental Status: He is alert.     Cranial Nerves: Cranial nerves 2-12 are intact.     Sensory: Sensation is intact.     Motor: Motor function is intact.     Coordination: Coordination is intact.     Gait: Gait is intact.     Comments:  CN 2-12 intact FTN intact EOMI without pain  Psychiatric:        Mood and Affect: Mood normal.        Behavior: Behavior normal.       Results for orders placed or performed in visit on 08/11/22  Comprehensive metabolic panel  Result Value Ref Range   Sodium 140 135 - 145 mEq/L   Potassium 4.3 3.5 - 5.1 mEq/L   Chloride  102 96 - 112 mEq/L   CO2 22 19 - 32 mEq/L   Glucose, Bld 203 (H) 70 - 99 mg/dL   BUN 16 6 - 23 mg/dL   Creatinine, Ser 1.11  0.40 - 1.50 mg/dL   Total Bilirubin 0.5 0.2 - 1.2 mg/dL   Alkaline Phosphatase 85 39 - 117 U/L   AST 18 0 - 37 U/L   ALT 24 0 - 53 U/L   Total Protein 7.7 6.0 - 8.3 g/dL   Albumin 4.3 3.5 - 5.2 g/dL   GFR 72.99 >60.00 mL/min   Calcium 9.6 8.4 - 10.5 mg/dL  CBC with Differential/Platelet  Result Value Ref Range   WBC 6.8 4.0 - 10.5 K/uL   RBC 5.47 4.22 - 5.81 Mil/uL   Hemoglobin 16.9 13.0 - 17.0 g/dL   HCT 49.7 39.0 - 52.0 %   MCV 90.8 78.0 - 100.0 fl   MCHC 34.0 30.0 - 36.0 g/dL   RDW 14.8 11.5 - 15.5 %   Platelets 283.0 150.0 - 400.0 K/uL   Neutrophils Relative % 58.7 43.0 - 77.0 %   Lymphocytes Relative 32.2 12.0 - 46.0 %   Monocytes Relative 7.1 3.0 - 12.0 %   Eosinophils Relative 1.1 0.0 - 5.0 %   Basophils Relative 0.9 0.0 - 3.0 %   Neutro Abs 4.0 1.4 - 7.7 K/uL   Lymphs Abs 2.2 0.7 - 4.0 K/uL   Monocytes Absolute 0.5 0.1 - 1.0 K/uL   Eosinophils Absolute 0.1 0.0 - 0.7 K/uL   Basophils Absolute 0.1 0.0 - 0.1 K/uL  PSA  Result Value Ref Range   PSA 0.87 0.10 - 4.00 ng/mL  TSH  Result Value Ref Range   TSH 1.64 0.35 - 5.50 uIU/mL  Fructosamine  Result Value Ref Range   Fructosamine 466 (H) 205 - 285 umol/L  POCT glycosylated hemoglobin (Hb A1C)  Result Value Ref Range   Hemoglobin A1C 11.2 (A) 4.0 - 5.6 %   HbA1c POC (<> result, manual entry)     HbA1c, POC (prediabetic range)     HbA1c, POC (controlled diabetic range)      Assessment & Plan:   Problem List Items Addressed This Visit     Type 2 diabetes mellitus with other specified complication ([place])   Relevant Orders   Fructosamine   Ambulatory referral to Ophthalmology   Headache above the eye region - Primary    New onset R>L frontal and periorbital headache, intermittent.  Non focal neurologic exam today.  Check labwork. Failed vision screen on right - will refer to  ophthalmology. He is unsure who he's previously seen but states had beginnings of diabetic retinopathy.  Will check head CT as well given new onset headache.  Update with results.  Discussed possible sleep apnea causing headache.       Relevant Orders   CT HEAD WO CONTRAST (5MM)   Ambulatory referral to Ophthalmology   Decreased visual acuity    Refer to eye doctor urgently       Relevant Orders   Ambulatory referral to Ophthalmology   Other Visit Diagnoses     Nonintractable episodic headache, unspecified headache type       Relevant Orders   Fructosamine   Sedimentation rate   Comprehensive metabolic panel   CBC with Differential/Platelet   CT HEAD WO CONTRAST (5MM)        No orders of the defined types were placed in this encounter.   Orders Placed This Encounter  Procedures   CT HEAD WO CONTRAST (5MM)    Standing Status:   Future    Standing Expiration Date:   09/20/2023    Order Specific Question:   Preferred imaging location?  Answer:   [facility]    Order Specific Question:   Release to patient    Answer:   Immediate [1]   Fructosamine   Sedimentation rate   Comprehensive metabolic panel   CBC with Differential/Platelet   Ambulatory referral to Ophthalmology    Referral Priority:   Urgent    Referral Type:   Consultation    Referral Reason:   Specialty Services Required    Requested Specialty:   Ophthalmology    Number of Visits Requested:   1    Patient Instructions  Labs today  I will order head CT for further evaluation of new onset headache. Call imaging center at [place] to schedule appointment. Eye exam today. Schedule eye doctor appointment.  Pending results we may refer you to sleep doctor.   Follow up plan: No follow-ups on file.  Ria Bush, MD

## 2022-09-21 LAB — CBC WITH DIFFERENTIAL/PLATELET
Basophils Absolute: 0.1 10*3/uL (ref 0.0–0.1)
Basophils Relative: 1.2 % (ref 0.0–3.0)
Eosinophils Absolute: 0.1 10*3/uL (ref 0.0–0.7)
Eosinophils Relative: 1.5 % (ref 0.0–5.0)
HCT: 49.1 % (ref 39.0–52.0)
Hemoglobin: 16.4 g/dL (ref 13.0–17.0)
Lymphocytes Relative: 38.4 % (ref 12.0–46.0)
Lymphs Abs: 2.1 10*3/uL (ref 0.7–4.0)
MCHC: 33.4 g/dL (ref 30.0–36.0)
MCV: 91.3 fl (ref 78.0–100.0)
Monocytes Absolute: 0.3 10*3/uL (ref 0.1–1.0)
Monocytes Relative: 6.3 % (ref 3.0–12.0)
Neutro Abs: 2.8 10*3/uL (ref 1.4–7.7)
Neutrophils Relative %: 52.6 % (ref 43.0–77.0)
Platelets: 223 10*3/uL (ref 150.0–400.0)
RBC: 5.38 Mil/uL (ref 4.22–5.81)
RDW: 14.3 % (ref 11.5–15.5)
WBC: 5.4 10*3/uL (ref 4.0–10.5)

## 2022-09-21 LAB — COMPREHENSIVE METABOLIC PANEL
ALT: 31 U/L (ref 0–53)
AST: 27 U/L (ref 0–37)
Albumin: 3.9 g/dL (ref 3.5–5.2)
Alkaline Phosphatase: 70 U/L (ref 39–117)
BUN: 18 mg/dL (ref 6–23)
CO2: 21 mEq/L (ref 19–32)
Calcium: 9.7 mg/dL (ref 8.4–10.5)
Chloride: 99 mEq/L (ref 96–112)
Creatinine, Ser: 1.26 mg/dL (ref 0.40–1.50)
GFR: 62.64 mL/min (ref >60.00)
Glucose, Bld: 478 mg/dL — ABNORMAL HIGH (ref 70–99)
Potassium: 4.4 mEq/L (ref 3.5–5.1)
Sodium: 133 mEq/L — ABNORMAL LOW (ref 135–145)
Total Bilirubin: 0.5 mg/dL (ref 0.2–1.2)
Total Protein: 7.4 g/dL (ref 6.0–8.3)

## 2022-09-21 LAB — SEDIMENTATION RATE: Sed Rate: 52 mm/hr — ABNORMAL HIGH (ref 0–20)

## 2022-09-25 LAB — FRUCTOSAMINE: Fructosamine: 521 umol/L — ABNORMAL HIGH (ref 205–285)

## 2022-09-25 NOTE — Telephone Encounter (Signed)
LM for call back regarding his referral.

## 2022-09-29 ENCOUNTER — Ambulatory Visit: Payer: Self-pay

## 2022-10-24 ENCOUNTER — Other Ambulatory Visit: Payer: Self-pay | Admitting: Family Medicine

## 2022-10-24 DIAGNOSIS — N529 Male erectile dysfunction, unspecified: Secondary | ICD-10-CM

## 2022-11-03 ENCOUNTER — Ambulatory Visit: Payer: Self-pay | Admitting: Family Medicine

## 2022-12-08 ENCOUNTER — Other Ambulatory Visit: Payer: Self-pay | Admitting: Family Medicine

## 2022-12-08 DIAGNOSIS — N529 Male erectile dysfunction, unspecified: Secondary | ICD-10-CM

## 2022-12-08 NOTE — Telephone Encounter (Signed)
E-scribed refill.  Plz schedule CPE and fasting lab visits to prevent delays in future refills.  

## 2022-12-12 ENCOUNTER — Other Ambulatory Visit: Payer: Self-pay | Admitting: Family Medicine

## 2022-12-12 DIAGNOSIS — N529 Male erectile dysfunction, unspecified: Secondary | ICD-10-CM

## 2022-12-12 NOTE — Telephone Encounter (Signed)
Rx already sent 12/08/22, #5/2 to [facility].  Rx denied.

## 2022-12-13 NOTE — Telephone Encounter (Signed)
Lvmtcb, sent mychart message  

## 2022-12-14 ENCOUNTER — Other Ambulatory Visit: Payer: Self-pay | Admitting: Family Medicine

## 2022-12-14 DIAGNOSIS — N529 Male erectile dysfunction, unspecified: Secondary | ICD-10-CM

## 2022-12-14 NOTE — Telephone Encounter (Signed)
Patient has been scheduled

## 2022-12-14 NOTE — Telephone Encounter (Signed)
Request already responded to by other means. Rx sent on 12/08/22, #5/2 to [facility].  Request denied.

## 2022-12-14 NOTE — Telephone Encounter (Signed)
Noted  

## 2022-12-17 ENCOUNTER — Other Ambulatory Visit: Payer: Self-pay | Admitting: Family Medicine

## 2022-12-17 DIAGNOSIS — N529 Male erectile dysfunction, unspecified: Secondary | ICD-10-CM

## 2022-12-18 ENCOUNTER — Telehealth: Payer: Self-pay | Admitting: Family Medicine

## 2022-12-18 DIAGNOSIS — N529 Male erectile dysfunction, unspecified: Secondary | ICD-10-CM

## 2022-12-18 NOTE — Telephone Encounter (Signed)
Pharmacy contacted the office regarding patient's prescription for sildenafil, stated the request they sent yesterday was denied. Stated according to there records, last refill of this medications was on 10/24/2022. Pharmacy wanted to know if the patient was sent in a refill for this medication and if not they ask if Dr. Reece Agar could send in a refill for it. Please advise pharmacy [phone]

## 2022-12-19 ENCOUNTER — Other Ambulatory Visit: Payer: Self-pay | Admitting: Family Medicine

## 2022-12-19 DIAGNOSIS — N529 Male erectile dysfunction, unspecified: Secondary | ICD-10-CM

## 2022-12-20 MED ORDER — SILDENAFIL CITRATE 100 MG PO TABS: ORAL_TABLET | ORAL | 2 refills | Status: DC

## 2022-12-20 NOTE — Telephone Encounter (Signed)
ERx. He will need to keep August appt.

## 2023-01-09 ENCOUNTER — Other Ambulatory Visit: Payer: Self-pay | Admitting: Family Medicine

## 2023-01-09 DIAGNOSIS — N529 Male erectile dysfunction, unspecified: Secondary | ICD-10-CM

## 2023-01-25 ENCOUNTER — Other Ambulatory Visit: Payer: Self-pay | Admitting: Family Medicine

## 2023-01-25 DIAGNOSIS — N529 Male erectile dysfunction, unspecified: Secondary | ICD-10-CM

## 2023-02-05 ENCOUNTER — Other Ambulatory Visit: Payer: Self-pay | Admitting: Family Medicine

## 2023-02-05 DIAGNOSIS — N529 Male erectile dysfunction, unspecified: Secondary | ICD-10-CM

## 2023-02-06 NOTE — Telephone Encounter (Signed)
Too soon. Rx sent on 01/26/23, #5/1 to [facility].  Request denied.

## 2023-02-13 ENCOUNTER — Other Ambulatory Visit: Payer: Self-pay | Admitting: Family Medicine

## 2023-02-13 ENCOUNTER — Telehealth: Payer: Self-pay | Admitting: Family Medicine

## 2023-02-13 DIAGNOSIS — N529 Male erectile dysfunction, unspecified: Secondary | ICD-10-CM

## 2023-02-13 MED ORDER — SILDENAFIL CITRATE 100 MG PO TABS
ORAL_TABLET | ORAL | 3 refills | Status: DC
Start: 2023-02-13 — End: 2023-03-14

## 2023-02-13 NOTE — Telephone Encounter (Signed)
Duplicate request (see 02/13/23 phn note).

## 2023-02-13 NOTE — Telephone Encounter (Signed)
E-scribed refill 

## 2023-02-13 NOTE — Telephone Encounter (Signed)
Prescription Request  02/13/2023  LOV: 09/20/2022  What is the name of the medication or equipment? sildenafil (VIAGRA) 100 MG tablet    Have you contacted your pharmacy to request a refill? Yes   Which pharmacy would you like this sent to?   [facility] [address] [facility] - [place], [place] - [address] [facility] [address] [place] [address] Phone: [phone] Fax: [phone]    Patient notified that their request is being sent to the clinical staff for review and that they should receive a response within 2 business days.   Please advise at Mobile [phone] (mobile)

## 2023-02-14 ENCOUNTER — Encounter (INDEPENDENT_AMBULATORY_CARE_PROVIDER_SITE_OTHER): Payer: Self-pay

## 2023-02-14 NOTE — Telephone Encounter (Signed)
Refill already sent 02/13/23 to [facility] (see 02/13/23 phn note.)  Request denied.

## 2023-03-03 ENCOUNTER — Other Ambulatory Visit: Payer: Self-pay | Admitting: Family Medicine

## 2023-03-03 DIAGNOSIS — E1169 Type 2 diabetes mellitus with other specified complication: Secondary | ICD-10-CM

## 2023-03-03 DIAGNOSIS — Z125 Encounter for screening for malignant neoplasm of prostate: Secondary | ICD-10-CM

## 2023-03-07 ENCOUNTER — Telehealth: Payer: Self-pay | Admitting: Family Medicine

## 2023-03-07 ENCOUNTER — Other Ambulatory Visit (INDEPENDENT_AMBULATORY_CARE_PROVIDER_SITE_OTHER): Payer: No Typology Code available for payment source

## 2023-03-07 DIAGNOSIS — E785 Hyperlipidemia, unspecified: Secondary | ICD-10-CM

## 2023-03-07 DIAGNOSIS — E1169 Type 2 diabetes mellitus with other specified complication: Secondary | ICD-10-CM

## 2023-03-07 DIAGNOSIS — Z125 Encounter for screening for malignant neoplasm of prostate: Secondary | ICD-10-CM

## 2023-03-07 LAB — COMPREHENSIVE METABOLIC PANEL
ALT: 9 U/L (ref 0–53)
AST: 8 U/L (ref 0–37)
Albumin: 3.8 g/dL (ref 3.5–5.2)
Alkaline Phosphatase: 81 U/L (ref 39–117)
BUN: 12 mg/dL (ref 6–23)
CO2: 24 mEq/L (ref 19–32)
Calcium: 8.9 mg/dL (ref 8.4–10.5)
Chloride: 101 meq/L (ref 96–112)
Creatinine, Ser: 1.05 mg/dL (ref 0.40–1.50)
GFR: 77.71 mL/min (ref >60.00)
Glucose, Bld: 317 mg/dL — ABNORMAL HIGH (ref 70–99)
Potassium: 4.2 meq/L (ref 3.5–5.1)
Sodium: 135 meq/L (ref 135–145)
Total Bilirubin: 0.7 mg/dL (ref 0.2–1.2)
Total Protein: 7 g/dL (ref 6.0–8.3)

## 2023-03-07 LAB — LDL CHOLESTEROL, DIRECT: Direct LDL: 97 mg/dL

## 2023-03-07 LAB — HEMOGLOBIN A1C: Hgb A1c MFr Bld: 11.4 % — ABNORMAL HIGH (ref 4.6–6.5)

## 2023-03-07 LAB — LIPID PANEL
Cholesterol: 242 mg/dL — ABNORMAL HIGH (ref 0–200)
HDL: 34.2 mg/dL — ABNORMAL LOW (ref >39.00)
Total CHOL/HDL Ratio: 7
Triglycerides: 497 mg/dL — ABNORMAL HIGH (ref 0.0–149.0)

## 2023-03-07 LAB — MICROALBUMIN / CREATININE URINE RATIO
Creatinine,U: 57 mg/dL
Microalb Creat Ratio: 4 mg/g (ref 0.0–30.0)
Microalb, Ur: 2.3 mg/dL — ABNORMAL HIGH (ref 0.0–1.9)

## 2023-03-07 NOTE — Telephone Encounter (Signed)
Jonathon [name] from eye center call stated the receive a referral for pt but the do not accept medicaid or veterans insurance [phone]

## 2023-03-07 NOTE — Telephone Encounter (Signed)
ATC patient, no answer  The patient will need to contact his insurance to see if they have a recommendation/preference of where his referral needs to be sent.   Is the [facility]?  If so, there may have to be documents filled out by the CMA to complete the referral process.

## 2023-03-09 LAB — PSA: PSA: 39.43 ng/mL — ABNORMAL HIGH (ref 0.10–4.00)

## 2023-03-13 NOTE — Telephone Encounter (Signed)
Unable to reach patient. Left voicemail to return call to our office.   

## 2023-03-14 ENCOUNTER — Encounter: Payer: Self-pay | Admitting: Family Medicine

## 2023-03-14 ENCOUNTER — Ambulatory Visit (INDEPENDENT_AMBULATORY_CARE_PROVIDER_SITE_OTHER): Payer: No Typology Code available for payment source | Admitting: Family Medicine

## 2023-03-14 VITALS — BP 136/74 | HR 110 | Temp 97.3°F | Ht 73.0 in | Wt 275.4 lb

## 2023-03-14 DIAGNOSIS — E785 Hyperlipidemia, unspecified: Secondary | ICD-10-CM

## 2023-03-14 DIAGNOSIS — G4731 Primary central sleep apnea: Secondary | ICD-10-CM

## 2023-03-14 DIAGNOSIS — Z8042 Family history of malignant neoplasm of prostate: Secondary | ICD-10-CM

## 2023-03-14 DIAGNOSIS — I10 Essential (primary) hypertension: Secondary | ICD-10-CM

## 2023-03-14 DIAGNOSIS — N529 Male erectile dysfunction, unspecified: Secondary | ICD-10-CM

## 2023-03-14 DIAGNOSIS — R000 Tachycardia, unspecified: Secondary | ICD-10-CM

## 2023-03-14 DIAGNOSIS — K219 Gastro-esophageal reflux disease without esophagitis: Secondary | ICD-10-CM

## 2023-03-14 DIAGNOSIS — E1169 Type 2 diabetes mellitus with other specified complication: Secondary | ICD-10-CM

## 2023-03-14 DIAGNOSIS — R0789 Other chest pain: Secondary | ICD-10-CM

## 2023-03-14 DIAGNOSIS — R3915 Urgency of urination: Secondary | ICD-10-CM

## 2023-03-14 DIAGNOSIS — Z0001 Encounter for general adult medical examination with abnormal findings: Secondary | ICD-10-CM

## 2023-03-14 DIAGNOSIS — F4310 Post-traumatic stress disorder, unspecified: Secondary | ICD-10-CM

## 2023-03-14 DIAGNOSIS — E1140 Type 2 diabetes mellitus with diabetic neuropathy, unspecified: Secondary | ICD-10-CM

## 2023-03-14 DIAGNOSIS — G4739 Other sleep apnea: Secondary | ICD-10-CM

## 2023-03-14 DIAGNOSIS — R972 Elevated prostate specific antigen [PSA]: Secondary | ICD-10-CM

## 2023-03-14 LAB — POC URINALSYSI DIPSTICK (AUTOMATED)
Bilirubin, UA: NEGATIVE
Blood, UA: NEGATIVE
Glucose, UA: POSITIVE — AB
Ketones, UA: NEGATIVE
Leukocytes, UA: NEGATIVE
Nitrite, UA: NEGATIVE
Protein, UA: NEGATIVE
Spec Grav, UA: 1.01 (ref 1.010–1.025)
Urobilinogen, UA: 0.2 E.U./dL
pH, UA: 6 (ref 5.0–8.0)

## 2023-03-14 LAB — PSA: PSA: 9.25 ng/mL — ABNORMAL HIGH (ref 0.10–4.00)

## 2023-03-14 MED ORDER — SILDENAFIL CITRATE 100 MG PO TABS
ORAL_TABLET | ORAL | 6 refills | Status: DC
Start: 2023-03-14 — End: 2023-05-22

## 2023-03-14 NOTE — Progress Notes (Signed)
Ph: [phone] Fax: [phone]   Patient ID: Ralph Dowdy, male    DOB: 25-Mar-1966, 57 y.o.   MRN: [id number]  This visit was conducted in person.  BP 136/74   Pulse (!) 110   Temp (!) 97.3 F (36.3 C)   Ht 6\' 1"  (1.854 m)   Wt 275 lb 6.4 oz (124.9 kg)   SpO2 98%   BMI 36.33 kg/m    CC: CPE Subjective:   HPI: Adriane Brincks is a 57 y.o. male presenting on 03/14/2023 for Annual Exam   [facility] Only has [employer], has 10% disability.  Elevated PSA recently. Over the past month notes new nocturia, urgency without dysuria. Notes chills at night with night sweats.  [facility] - [facility] 02/01/2023 0.84.   DM - jardiance and ozempic through the [place]. Also on metformin 1000mg  bid. Glucometer brand - one touch. States he was taken off glipizide by [place].   H/o central and obstructive (complex) sleep apnea saw pulm last 06/2020 - did not start treatment.  HST 02/11/20- mild complex(central and obstructive) sleep apnea, AHI 11.9/ hr, desaturation to 80%, body weight 296 lbs    Chronic tachycardia - not drinking coffee. Denies palpitations. Last saw cardiology 06/2020 with reassuring echocardiogram and Lexiscan.   Seen at [facility] earlier this month with left sided reproducible chest pain with radiation to axilla. Started on protonix 20mg  daily without benefit. Has chronic intermittent dyspnea since prior COVID infection, unchanged. Currently no significant chest pain or shortness of breath. Denies inciting trauma/injury or falls.   Preventative: COLONOSCOPY 02/2018 - diverticulitis, rpt 5 yrs given fmhx colon cancer ([facility]) - has rpt scheduled 06/21/2023 Prostate cancer screening - yearly PSA. Notes increased nocturia x1, strong stream. Increased urinary urgency x 1 month, No UTI symptoms, no incomplete emptying, no dribbling.  Lung cancer screening - not eligible  Flu shot - declines COVID vaccine Pfizer 11/2019 x2, booster 06/2020, bivalent 07/2021, 04/2022 Tdap 03-26-2013, had another  one 02/2023 Pneumovax - 05/2019 Shingrix at [facility] - 09/2021, pending 2nd Seat belt use discussed Sunscreen use discussed. No changing moles on skin.  Non smoker  Alcohol - none - stopped drinking 03/26/2017 Rec drugs - none Dentist - due, no insurance Eye exam - ~09/2021 in [place]  Widower - wife suddenly passed away 26-Mar-2012. New wife.  Has 5 children but none live at home. 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 Interim medical history since our last visit reviewed. Allergies and medications reviewed and updated. Outpatient Medications Prior to Visit  Medication Sig Dispense Refill   clotrimazole (LOTRIMIN) 1 % cream APPLY TO AFFECTED AREA TWICE A DAY 60 g 1   gabapentin (NEURONTIN) 300 MG capsule Take 1 capsule (300 mg total) by mouth 2 (two) times daily.     glucose blood (ONE TOUCH ULTRA TEST) test strip Check blood sugar twice a day and as instructed. Dx 250.00 100 each 3   JARDIANCE 25 MG TABS tablet Take by mouth.     lisinopril (ZESTRIL) 10 MG tablet Pt unsure of dose     metFORMIN (GLUCOPHAGE-XR) 500 MG 24 hr tablet Take 2 tablets (1,000 mg total) by mouth 2 (two) times daily with a meal.     pantoprazole (PROTONIX) 20 MG tablet Take 1 tablet (20 mg  total) by mouth daily. [facility]     propranolol ER (INDERAL LA) 60 MG 24 hr capsule Take 1 capsule (60 mg total) by mouth daily. For migraine prevention     Semaglutide,0.25 or 0.5MG /DOS, (OZEMPIC, 0.25 OR 0.5 MG/DOSE,) 2 MG/1.5ML SOPN Inject 0.5 mg as directed once a week. 1.5 mL 5   atorvastatin (LIPITOR) 40 MG tablet Take 1 tablet (40 mg total) by mouth daily. at 6pm 90 tablet 3   glipiZIDE (GLUCOTROL) 10 MG tablet Take 1 tablet (10 mg total) by mouth 2 (two) times daily before a meal. 180 tablet 2   metFORMIN (GLUCOPHAGE)  1000 MG tablet Take 1 tablet (1,000 mg total) by mouth 2 (two) times daily with a meal. 180 tablet 3   propranolol (INDERAL) 60 MG tablet Take 1 tablet (60 mg total) by mouth daily. For migraine prevention     sildenafil (VIAGRA) 100 MG tablet TAKE ONE-HALF TO ONE TABLET BY MOUTH ONE TIME DAILY AS NEEDED FOR ERECTILE DYSFUNCTION 5 tablet 3   atorvastatin (LIPITOR) 80 MG tablet Take 1 tablet (80 mg total) by mouth daily. at 6pm     No facility-administered medications prior to visit.     Per HPI unless specifically indicated in ROS section below Review of Systems  Constitutional:  Positive for appetite change (increased), chills and unexpected weight change (weight loss). Negative for activity change, fatigue and fever.  HENT:  Negative for hearing loss.   Eyes:  Negative for visual disturbance.  Respiratory:  Negative for cough, chest tightness, shortness of breath and wheezing.   Cardiovascular:  Positive for palpitations. Negative for chest pain and leg swelling.  Gastrointestinal:  Negative for abdominal distention, abdominal pain, blood in stool, constipation, diarrhea, nausea and vomiting.  Genitourinary:  Negative for difficulty urinating and hematuria.  Musculoskeletal:  Negative for arthralgias, myalgias and neck pain.  Skin:  Negative for rash.  Neurological:  Negative for dizziness, seizures, syncope and headaches.  Hematological:  Negative for adenopathy. Does not bruise/bleed easily.  Psychiatric/Behavioral:  Positive for dysphoric mood. The patient is nervous/anxious.     Objective:  BP 136/74   Pulse (!) 110   Temp (!) 97.3 F (36.3 C)   Ht 6\' 1"  (1.854 m)   Wt 275 lb 6.4 oz (124.9 kg)   SpO2 98%   BMI 36.33 kg/m   Wt Readings from Last 3 Encounters:  03/14/23 275 lb 6.4 oz (124.9 kg)  09/20/22 288 lb 2 oz (130.7 kg)  08/11/22 287 lb (130.2 kg)      Physical Exam Vitals and nursing note reviewed.  Constitutional:      General: He is not in acute distress.     Appearance: Normal appearance. He is well-developed. He is not ill-appearing.  HENT:     Head: Normocephalic and atraumatic.     Right Ear: Hearing, tympanic membrane, ear canal and external ear normal.     Left Ear: Hearing, tympanic membrane, ear canal and external ear normal.     Mouth/Throat:     Mouth: Mucous membranes are moist.     Pharynx: Oropharynx is clear. No oropharyngeal exudate or posterior oropharyngeal erythema.  Eyes:     General: No scleral icterus.    Extraocular Movements: Extraocular movements intact.     Conjunctiva/sclera: Conjunctivae normal.     Pupils: Pupils are equal, round, and reactive to light.  Neck:     Thyroid: No thyroid mass or thyromegaly.  Cardiovascular:     Rate and Rhythm:  Normal rate and regular rhythm.     Pulses: Normal pulses.          Radial pulses are 2+ on the right side and 2+ on the left side.     Heart sounds: Normal heart sounds. No murmur heard. Pulmonary:     Effort: Pulmonary effort is normal. No respiratory distress.     Breath sounds: Normal breath sounds. No wheezing, rhonchi or rales.  Abdominal:     General: Bowel sounds are normal. There is no distension.     Palpations: Abdomen is soft. There is no mass.     Tenderness: There is no abdominal tenderness. There is no guarding or rebound.     Hernia: No hernia is present.  Genitourinary:    Prostate: Normal. Not enlarged, not tender and no nodules present.     Rectum: Normal. No mass, tenderness, anal fissure, external hemorrhoid or internal hemorrhoid. Normal anal tone.     Comments: No prostate asymmetry or tender bogginess Musculoskeletal:        General: Normal range of motion.     Cervical back: Normal range of motion and neck supple.     Right lower leg: No edema.     Left lower leg: No edema.  Lymphadenopathy:     Cervical: No cervical adenopathy.  Skin:    General: Skin is warm and dry.     Findings: No rash.  Neurological:     General: No focal deficit  present.     Mental Status: He is alert and oriented to person, place, and time.  Psychiatric:        Mood and Affect: Mood normal.        Behavior: Behavior normal.        Thought Content: Thought content normal.        Judgment: Judgment normal.       Results for orders placed or performed in visit on 03/14/23  PSA  Result Value Ref Range   PSA 9.25 (H) 0.10 - 4.00 ng/mL  D-dimer, quantitative  Result Value Ref Range   D-Dimer, Quant 0.21 <0.50 mcg/mL FEU  POCT Urinalysis Dipstick (Automated)  Result Value Ref Range   Color, UA Yellow    Clarity, UA Clear    Glucose, UA Positive (A) Negative   Bilirubin, UA Negative    Ketones, UA Negative    Spec Grav, UA 1.010 1.010 - 1.025   Blood, UA Negative    pH, UA 6.0 5.0 - 8.0   Protein, UA Negative Negative   Urobilinogen, UA 0.2 0.2 or 1.0 E.U./dL   Nitrite, UA Negative    Leukocytes, UA Negative Negative   Lab Results  Component Value Date   PSA 9.25 (H) 03/14/2023   PSA 39.43 (H) 03/07/2023   PSA 0.87 08/11/2022  [facility] 0.84 ([facility])  Lab Results  Component Value Date   CHOL 242 (H) 03/07/2023   HDL 34.20 (L) 03/07/2023   LDLCALC 90 11/20/2012   LDLDIRECT 97.0 03/07/2023   TRIG (H) 03/07/2023    497.0 Triglyceride is over 400; calculations on Lipids are invalid.   CHOLHDL 7 03/07/2023    Lab Results  Component Value Date   NA 135 03/07/2023   CL 101 03/07/2023   K 4.2 03/07/2023   CO2 24 03/07/2023   BUN 12 03/07/2023   CREATININE 1.05 03/07/2023   GFR 77.71 03/07/2023   CALCIUM 8.9 03/07/2023   ALBUMIN 3.8 03/07/2023   GLUCOSE 317 (H) 03/07/2023  Lab Results  Component Value Date   ALT 9 03/07/2023   AST 8 03/07/2023   ALKPHOS 81 03/07/2023   BILITOT 0.7 03/07/2023    Lab Results  Component Value Date   WBC 5.4 09/20/2022   HGB 16.4 09/20/2022   HCT 49.1 09/20/2022   MCV 91.3 09/20/2022   PLT 223.0 09/20/2022    Lab Results  Component Value Date   TSH 1.64 08/11/2022   Lab Results   Component Value Date   HGBA1C 11.4 (H) 03/07/2023       03/14/2023   10:39 AM 09/20/2022    2:58 PM 08/11/2022   11:12 AM 01/31/2022    2:51 PM 10/01/2020    9:38 AM  Depression screen PHQ 2/9  Decreased Interest 1 1 0 0 0  Down, Depressed, Hopeless 1 2 1  0 0  PHQ - 2 Score 2 3 1  0 0  Altered sleeping 1 3 2  0   Tired, decreased energy  3 1 1    Change in appetite 0 0 2 0   Feeling bad or failure about yourself  1 2 2 1    Trouble concentrating 0 1 0 1   Moving slowly or fidgety/restless 2 2 0 0   Suicidal thoughts 0 1 0 0   PHQ-9 Score 6 15 8 3    Difficult doing work/chores Very difficult Extremely dIfficult Somewhat difficult         03/14/2023   10:39 AM 09/20/2022    2:59 PM 08/11/2022   11:12 AM 01/31/2022    2:51 PM  GAD 7 : Generalized Anxiety Score  Nervous, Anxious, on Edge 2 2 1 1   Control/stop worrying 3 2 2 3   Worry too much - different things 3 2 2 3   Trouble relaxing 3 3 3 3   Restless 3 1 2 3   Easily annoyed or irritable 2 2 3  0  Afraid - awful might happen 1 2 3  0  Total GAD 7 Score 17 14 16 13   Anxiety Difficulty Very difficult Very difficult Somewhat difficult Not difficult at all   Assessment & Plan:   Problem List Items Addressed This Visit     Encounter for general adult medical examination with abnormal findings - Primary (Chronic)    I have personally reviewed the Medicare Annual Wellness questionnaire and have noted 1. The patient's medical and social history 2. Their use of alcohol, tobacco or illicit drugs 3. Their current medications and supplements 4. The patient's functional ability including ADL's, fall risks, home safety risks and hearing or visual impairment. Cognitive function has been assessed and addressed as indicated.  5. Diet and physical activity 6. Evidence for depression or mood disorders The patients weight, height, BMI have been recorded in the chart. I have made referrals, counseling and provided education to the patient based on  review of the above and I have provided the pt with a written personalized care plan for preventive services. Provider list updated.. See scanned questionairre as needed for further documentation. Reviewed preventative protocols and updated unless pt declined.       Type 2 diabetes mellitus with other specified complication (HCC)    Chronic, deteriorated.  He continues jardiance and ozempic through [place] along with metformin 1000mg  BID.  Glipizide was recently stopped.  This is followed by the [place]. I did offer endocrinology evaluation - he will consider.       Relevant Medications   atorvastatin (LIPITOR) 80 MG tablet   metFORMIN (GLUCOPHAGE-XR) 500 MG 24  hr tablet   Hypertension, essential    Chronic, stable. Continue lisinopril 10mg  daily.  [facility] also takes propranolol 60mg  daily for migraine prevention.       Relevant Medications   sildenafil (VIAGRA) 100 MG tablet   atorvastatin (LIPITOR) 80 MG tablet   propranolol ER (INDERAL LA) 60 MG 24 hr capsule   Dyslipidemia associated with type 2 diabetes mellitus (HCC)    Chronic, uncontrolled despite atorvastatin 80mg  daily.  Consider adding fibrate. The 10-year ASCVD risk score (Arnett DK, et al., 2019) is: 29.7%   Values used to calculate the score:     Age: 67 years     Sex: Male     Is Non-Hispanic African American: Yes     Diabetic: Yes     Tobacco smoker: No     Systolic Blood Pressure: 136 mmHg     Is BP treated: Yes     HDL Cholesterol: 34.2 mg/dL     Total Cholesterol: 242 mg/dL       Relevant Medications   atorvastatin (LIPITOR) 80 MG tablet   metFORMIN (GLUCOPHAGE-XR) 500 MG 24 hr tablet   Severe obesity (BMI 35.0-39.9) with comorbidity (HCC)    13 lb weight loss since last visit, he continues ozempic 0.5mg  weekly.      Relevant Medications   metFORMIN (GLUCOPHAGE-XR) 500 MG 24 hr tablet   ED (erectile dysfunction) of organic origin    Requests viagra refill - will Rx to pharmacy.       Relevant  Medications   sildenafil (VIAGRA) 100 MG tablet   Family history of prostate cancer    Strong fmhx prostate cancer.  See above.       Type 2 diabetes mellitus with diabetic neuropathy, unspecified (HCC)   Relevant Medications   atorvastatin (LIPITOR) 80 MG tablet   metFORMIN (GLUCOPHAGE-XR) 500 MG 24 hr tablet   Complex sleep apnea syndrome    H/o this, not using CPAP      Tachycardia    Chronic, should be on propranolol 60mg  daily for migraine prevention.       PTSD (post-traumatic stress disorder)    Followed by [facility], currently off medication. Has previously seen counselor.       GERD (gastroesophageal reflux disease)    Recently started on pantoprazole 20mg        Elevated PSA    Isolated elevation of PSA to 39, with some lower urinary tract symptoms. Check UA today - overall without signs of infection. DRE without obvious prostatitis or asymmetry/nodularity.  Will repeat PSA today and if trending down, will continue to monitor. If staying high ,will refer to urology.       Relevant Orders   PSA (Completed)   Chest wall pain    Reproducible left chest wall pain, seen at [facility] earlier this month with reassuring evaluation. He was started on OTC PPI without significant benefit. Notes ongoing intermittent dyspnea post-COVID. Given ongoing dyspnea, tachycardia, and risk factor of obesity as well as h/o DVT 2021, check D-dimer to evaluate possible PE as cause.       Relevant Orders   D-dimer, quantitative (Completed)   Other Visit Diagnoses     Urinary urgency       Relevant Orders   POCT Urinalysis Dipstick (Automated) (Completed)        Meds ordered this encounter  Medications   sildenafil (VIAGRA) 100 MG tablet    Sig: TAKE ONE-HALF TO ONE TABLET BY MOUTH ONE TIME DAILY AS  NEEDED FOR ERECTILE DYSFUNCTION    Dispense:  10 tablet    Refill:  6    Orders Placed This Encounter  Procedures   PSA   D-dimer, quantitative   POCT Urinalysis Dipstick (Automated)     Patient Instructions  Urinalysis today - with sugar but not signs of infection. We will also repeat PSA prostate test and if persistently high will recommend you see a urologist.  One more blood test today (D dimer) looking for blood clot, if elevated will recommend CT angiogram of chest.  You are due for second [facility]ingles [facility] - [facility] with [facility].  Send [place] date of latest tetanus (Td) shot to update your chart. Or check with [facility] if they can give this to you.  Sugars remain uncontrolled. I will refer you to diabetes doctor.  Return to see me in 6 weeks.   Follow up plan: Return in about 6 weeks (around 04/25/2023), or if symptoms worsen or fail to improve, for follow up visit.  [name], MD

## 2023-03-14 NOTE — Patient Instructions (Addendum)
Urinalysis today - with sugar but not signs of infection. We will also repeat PSA prostate test and if persistently high will recommend you see a urologist.  One more blood test today (D dimer) looking for blood clot, if elevated will recommend CT angiogram of chest.  You are due for second [facility]ingles shot - [facility] with [facility].  Send [place] date of latest tetanus (Td) shot to update your chart. Or check with [facility] if they can give this to you.  Sugars remain uncontrolled. I will refer you to diabetes doctor.  Return to see me in 6 weeks.

## 2023-03-15 LAB — D-DIMER, QUANTITATIVE: D-Dimer, Quant: 0.21 ug{FEU}/mL (ref <0.50)

## 2023-03-15 NOTE — Telephone Encounter (Signed)
Lvm asking pt to call back. Need to relay Jonathon [name]'s message about referral.

## 2023-03-16 ENCOUNTER — Encounter: Payer: Self-pay | Admitting: Family Medicine

## 2023-03-16 ENCOUNTER — Telehealth: Payer: Self-pay | Admitting: Family Medicine

## 2023-03-16 DIAGNOSIS — R972 Elevated prostate specific antigen [PSA]: Secondary | ICD-10-CM

## 2023-03-16 DIAGNOSIS — E1169 Type 2 diabetes mellitus with other specified complication: Secondary | ICD-10-CM

## 2023-03-16 DIAGNOSIS — R0789 Other chest pain: Secondary | ICD-10-CM | POA: Insufficient documentation

## 2023-03-16 NOTE — Assessment & Plan Note (Signed)
Strong fmhx prostate cancer.  See above.

## 2023-03-16 NOTE — Assessment & Plan Note (Signed)
Requests viagra refill - will Rx to pharmacy.

## 2023-03-16 NOTE — Assessment & Plan Note (Addendum)
Isolated elevation of PSA to 39, with some lower urinary tract symptoms. Check UA today - overall without signs of infection. DRE without obvious prostatitis or asymmetry/nodularity.  Will repeat PSA today and if trending down, will continue to monitor. If staying high ,will refer to urology.

## 2023-03-16 NOTE — Assessment & Plan Note (Addendum)
Chronic, stable. Continue lisinopril 10mg  daily.  [facility] also takes propranolol 60mg  daily for migraine prevention.

## 2023-03-16 NOTE — Assessment & Plan Note (Addendum)
Followed by [facility], currently off medication. Has previously seen counselor.

## 2023-03-16 NOTE — Assessment & Plan Note (Addendum)
Chronic, deteriorated.  He continues jardiance and ozempic through [place] along with metformin 1000mg  BID.  Glipizide was recently stopped.  This is followed by the [place]. I did offer endocrinology evaluation - he will consider.

## 2023-03-16 NOTE — Telephone Encounter (Signed)
Unsure if he checks mychart. Plz notify: PSA test is improving - from 39 to 9 in the past week. This is overall reassuring - recommend schedule lab visit in 3 months to repeat this test. Let us know if new urinary symptoms develop in the meantime. Urine test returned ok except for sugar which is likely Jardiance effect. No signs of infection or bleeding. D dimer test looking for blood clot returned normal. Would ensure he is taking atorvastatin 80mg  daily.   His diabetes remains uncontrolled. [facility] with [facility] or does he want me to refer him to a diabetes specialist?

## 2023-03-16 NOTE — Assessment & Plan Note (Addendum)
Reproducible left chest wall pain, seen at [facility] earlier this month with reassuring evaluation. He was started on OTC PPI without significant benefit. Notes ongoing intermittent dyspnea post-COVID. Given ongoing dyspnea, tachycardia, and risk factor of obesity as well as h/o DVT 2021, check D-dimer to evaluate possible PE as cause.

## 2023-03-16 NOTE — Assessment & Plan Note (Signed)
H/o this, not using CPAP

## 2023-03-16 NOTE — Assessment & Plan Note (Addendum)
Chronic, uncontrolled despite atorvastatin 80mg  daily.  Consider adding fibrate. The 10-year ASCVD risk score (Arnett DK, et al., 2019) is: 29.7%   Values used to calculate the score:     Age: 57 years     Sex: Male     Is Non-Hispanic African American: Yes     Diabetic: Yes     Tobacco smoker: No     Systolic Blood Pressure: 136 mmHg     Is BP treated: Yes     HDL Cholesterol: 34.2 mg/dL     Total Cholesterol: 242 mg/dL

## 2023-03-16 NOTE — Assessment & Plan Note (Signed)
13 lb weight loss since last visit, he continues ozempic 0.5mg  weekly.

## 2023-03-16 NOTE — Assessment & Plan Note (Signed)

## 2023-03-16 NOTE — Assessment & Plan Note (Signed)
Recently started on pantoprazole 20mg 

## 2023-03-16 NOTE — Telephone Encounter (Addendum)
Lvm asking pt to call back. Need to relay Ashtyn's message about referral. Also, mailing a letter.

## 2023-03-16 NOTE — Assessment & Plan Note (Signed)
Chronic, should be on propranolol 60mg  daily for migraine prevention.

## 2023-03-16 NOTE — Telephone Encounter (Addendum)
Lvm asking pt to call back. Need to relay Dr Timoteo Expose message and get answer to his question.   Also, I'm mailing a letter with results and info from previous phn note (see 03/07/23 phn note).

## 2023-05-22 ENCOUNTER — Telehealth: Payer: Self-pay

## 2023-05-22 DIAGNOSIS — N529 Male erectile dysfunction, unspecified: Secondary | ICD-10-CM

## 2023-05-22 MED ORDER — SILDENAFIL CITRATE 100 MG PO TABS: ORAL_TABLET | ORAL | 9 refills | Status: DC

## 2023-05-22 NOTE — Telephone Encounter (Signed)
E-scribed refill 

## 2023-06-25 ENCOUNTER — Other Ambulatory Visit: Payer: Self-pay | Admitting: Family Medicine

## 2023-06-25 DIAGNOSIS — N529 Male erectile dysfunction, unspecified: Secondary | ICD-10-CM

## 2023-08-23 ENCOUNTER — Other Ambulatory Visit: Payer: Self-pay | Admitting: Family Medicine

## 2023-08-23 DIAGNOSIS — N529 Male erectile dysfunction, unspecified: Secondary | ICD-10-CM

## 2023-10-18 ENCOUNTER — Other Ambulatory Visit: Payer: Self-pay

## 2023-10-18 DIAGNOSIS — N529 Male erectile dysfunction, unspecified: Secondary | ICD-10-CM

## 2023-10-18 MED ORDER — SILDENAFIL CITRATE 100 MG PO TABS: ORAL_TABLET | ORAL | 4 refills | Status: DC

## 2023-10-18 NOTE — Telephone Encounter (Signed)
 E-scribed refill

## 2023-11-08 ENCOUNTER — Other Ambulatory Visit: Payer: Self-pay | Admitting: Family Medicine

## 2023-11-08 DIAGNOSIS — N529 Male erectile dysfunction, unspecified: Secondary | ICD-10-CM

## 2023-11-08 NOTE — Telephone Encounter (Signed)
 Too soon. Rx sent 10/18/23, #5/4 refills to [facility].  Request denied.

## 2023-11-28 ENCOUNTER — Telehealth: Payer: Self-pay

## 2023-11-28 DIAGNOSIS — N529 Male erectile dysfunction, unspecified: Secondary | ICD-10-CM

## 2023-11-28 MED ORDER — SILDENAFIL CITRATE 100 MG PO TABS: ORAL_TABLET | ORAL | 3 refills | Status: DC

## 2023-11-28 NOTE — Telephone Encounter (Signed)
 Received faxed refill request for sildenafil  100 mg tab.   E-scribed refill.

## 2024-01-17 ENCOUNTER — Other Ambulatory Visit: Payer: Self-pay

## 2024-01-17 DIAGNOSIS — N529 Male erectile dysfunction, unspecified: Secondary | ICD-10-CM

## 2024-01-17 NOTE — Telephone Encounter (Signed)
 Opened in error

## 2024-01-17 NOTE — Telephone Encounter (Signed)
 Pt needs CPE and fasting lab visit- 1 wk prior, for additional refills.

## 2024-01-24 LAB — BASIC METABOLIC PANEL WITH GFR
CO2: 22 (ref 13–22)
Chloride: 100 (ref 99–108)
Creatinine: 1.2 (ref 0.6–1.3)
Glucose: 257
Potassium: 4 meq/L (ref 3.5–5.1)
Sodium: 139 (ref 137–147)

## 2024-01-24 LAB — COMPREHENSIVE METABOLIC PANEL WITH GFR: Calcium: 9.4 (ref 8.7–10.7)

## 2024-01-24 LAB — HEMOGLOBIN A1C: Hemoglobin A1C: 10.8

## 2024-02-01 MED ORDER — SILDENAFIL CITRATE 100 MG PO TABS: ORAL_TABLET | ORAL | 3 refills | Status: DC

## 2024-02-01 NOTE — Telephone Encounter (Signed)
 Noted

## 2024-02-01 NOTE — Addendum Note (Signed)
 Addended by: Treyveon Mochizuki on: 02/01/2024 10:00 AM   Modules accepted: Orders

## 2024-02-01 NOTE — Telephone Encounter (Unsigned)
 Copied from CRM [id number]. Topic: Clinical - Medication Question >> Jan 31, 2024  4:34 PM Burnard DEL wrote: Reason for CRM: Patient called in and schedule next OV available with PCP.Patient would like to know If he could  still have a refill on medication   sildenafil  (VIAGRA ) 100 MG tablet  Until he makes it to that appointment?

## 2024-02-12 ENCOUNTER — Encounter: Payer: Self-pay | Admitting: Family Medicine

## 2024-02-12 ENCOUNTER — Ambulatory Visit: Payer: Self-pay | Admitting: Family Medicine

## 2024-02-12 VITALS — BP 130/84 | HR 79 | Temp 98.7°F | Ht 73.0 in | Wt 267.0 lb

## 2024-02-12 DIAGNOSIS — E113393 Type 2 diabetes mellitus with moderate nonproliferative diabetic retinopathy without macular edema, bilateral: Secondary | ICD-10-CM | POA: Diagnosis not present

## 2024-02-12 DIAGNOSIS — I10 Essential (primary) hypertension: Secondary | ICD-10-CM

## 2024-02-12 DIAGNOSIS — N529 Male erectile dysfunction, unspecified: Secondary | ICD-10-CM | POA: Diagnosis not present

## 2024-02-12 DIAGNOSIS — R972 Elevated prostate specific antigen [PSA]: Secondary | ICD-10-CM | POA: Diagnosis not present

## 2024-02-12 DIAGNOSIS — Z7985 Long-term (current) use of injectable non-insulin antidiabetic drugs: Secondary | ICD-10-CM

## 2024-02-12 DIAGNOSIS — E1169 Type 2 diabetes mellitus with other specified complication: Secondary | ICD-10-CM | POA: Diagnosis not present

## 2024-02-12 DIAGNOSIS — E785 Hyperlipidemia, unspecified: Secondary | ICD-10-CM

## 2024-02-12 LAB — URINALYSIS, ROUTINE W REFLEX MICROSCOPIC
Bilirubin Urine: NEGATIVE
Hgb urine dipstick: NEGATIVE
Ketones, ur: NEGATIVE
Nitrite: NEGATIVE
RBC / HPF: NONE SEEN (ref 0–?)
Specific Gravity, Urine: <=1.005 — AB (ref 1.000–1.030)
Total Protein, Urine: NEGATIVE
Urine Glucose: >=1000 — AB
Urobilinogen, UA: 0.2 (ref 0.0–1.0)
pH: 6 (ref 5.0–8.0)

## 2024-02-12 LAB — VITAMIN B12: Vitamin B-12: 222 pg/mL (ref 211–911)

## 2024-02-12 LAB — LIPID PANEL
Cholesterol: 231 mg/dL — ABNORMAL HIGH (ref 0–200)
HDL: 36.7 mg/dL — ABNORMAL LOW (ref >39.00)
NonHDL: 194.22
Total CHOL/HDL Ratio: 6
Triglycerides: 420 mg/dL — ABNORMAL HIGH (ref 0.0–149.0)
VLDL: 84 mg/dL — ABNORMAL HIGH (ref 0.0–40.0)

## 2024-02-12 LAB — MICROALBUMIN / CREATININE URINE RATIO
Creatinine,U: 46.4 mg/dL
Microalb Creat Ratio: 29.7 mg/g (ref 0.0–30.0)
Microalb, Ur: 1.4 mg/dL (ref 0.0–1.9)

## 2024-02-12 LAB — LDL CHOLESTEROL, DIRECT: Direct LDL: 73 mg/dL

## 2024-02-12 MED ORDER — SILDENAFIL CITRATE 100 MG PO TABS
ORAL_TABLET | ORAL | 6 refills | Status: DC
Start: 2024-02-12 — End: 2024-03-24

## 2024-02-12 NOTE — Assessment & Plan Note (Signed)
 Chronic, stable. Continue lisinopril  10mg  daily, propranolol as well.

## 2024-02-12 NOTE — Assessment & Plan Note (Signed)
 Chronic, remaining uncontrolled. This is followed by [facility].  Continue current regimen - planning to increase ozempic  to 1mg  weekly.

## 2024-02-12 NOTE — Assessment & Plan Note (Addendum)
 Continue to encourage weight loss.  Pending increased ozempic  dose

## 2024-02-12 NOTE — Addendum Note (Signed)
 Addended by: RILLA BALLER on: 02/12/2024 10:10 AM   Modules accepted: Orders

## 2024-02-12 NOTE — Assessment & Plan Note (Addendum)
 Reviewed latest diabetic eye exam 11/2023 with patient.

## 2024-02-12 NOTE — Assessment & Plan Note (Addendum)
 Elevated PSA last year, lost to f/u Last UA normal.  Repeat PSA free and total today Discussed low threshold for urology referral if persistently elevated.

## 2024-02-12 NOTE — Patient Instructions (Addendum)
 Labs today including prostate level and cholesterol levels  Continue current medicines.  Return as needed or in 3-4 months for physical Try to get me records or at least date of colonoscopy.

## 2024-02-12 NOTE — Assessment & Plan Note (Signed)
 Chronic on atorvastatin  80mg  - update FLP. The 10-year ASCVD risk score (Arnett DK, et al., 2019) is: 28.6%   Values used to calculate the score:     Age: 58 years     Clincally relevant sex: Male     Is Non-Hispanic African American: Yes     Diabetic: Yes     Tobacco smoker: No     Systolic Blood Pressure: 130 mmHg     Is BP treated: Yes     HDL Cholesterol: 34.2 mg/dL     Total Cholesterol: 242 mg/dL

## 2024-02-12 NOTE — Progress Notes (Addendum)
 Ph: [phone] Fax: [phone]   Patient ID: Jonathon [name], male    DOB: [birth year], 58 y.o.   MRN: [id number]  This visit was conducted in person.  BP 130/84   Pulse 79   Temp 98.7 F (37.1 C) (Oral)   Ht 6' 1 (1.854 m)   Wt 267 lb (121.1 kg)   SpO2 99%   BMI 35.23 kg/m    CC: f/u visit  Subjective:   HPI: Jonathon [name] is a 58 y.o. male presenting on 02/12/2024 for Medical Management of Chronic Issues (Medicine refill)   Last seen for CPE 02/2023. Regularly sees [facility].   Has seen for migraines - now on propranolol 60mg  daily for ppx  Requests ED med refilled - sildenafil  100mg  daily PRN. Tolerating well without HA, flushing, indigestion, chest pain. Not on nitrates.   HTN - Compliant with current antihypertensive regimen of lisinopril  10mg  daily, propranolol SR 60mg  daily for migraine prevention.  Does check blood pressures at home: - overall well controlled, latest 120s/70s. No low blood pressure readings or symptoms of dizziness/syncope. Denies HA, vision changes, CP/tightness, SOB, leg swelling.    No alcohol use.   DM - does regularly check sugars, fasting 210. Compliant with antihyperglycemic regimen which includes: jardiance  25mg  daily, metformin  XR 1000mg  bid, ozempic  0.5mg  weekly - planning to increase to 1mg  through [facility]. Denies low sugars or hypoglycemic symptoms. Denies paresthesias, blurry vision. Last diabetic eye exam 11/2023 at [facility] - mod NPDR OU. Glucometer brand: one touch. Last foot exam: completed a few months ago at the [facility], toenail fungus treated with topical lacquer. DSME: states completed at the [facility]. Lab Results  Component Value Date   HGBA1C 10.8 01/24/2024  A1c 9.8 (10/2023), 10.8% (last week) Diabetic Foot Exam - Simple   Simple Foot Form Diabetic Foot exam was performed with the following findings: Yes 02/12/2024 10:09 AM  Visual Inspection See comments: Yes Sensation Testing See comments: Yes Pulse Check See comments: Yes Comments 1+ DP  bilaterally Diminished sensation to monofilament testing Some scaling present to bilateral  soles     No results found for: MACKEY CURRENT   [facility] - A1c 10.8, Cr 1.2, GFR 69, glu 257, K 4 (01/24/2024)  Elevated PSA 02/2023 to 39 with improvement on recheck to 9, rec rpt close f/u - didn't return for repeat PSA. Notes nocturia x1, chronic. No dysuria, hematuria, urgency/frequency.      Relevant past medical, surgical, family and social history reviewed and updated as indicated. Interim medical history since our last visit reviewed. Allergies and medications reviewed and updated. Outpatient Medications Prior to Visit  Medication Sig Dispense Refill   atorvastatin  (LIPITOR) 80 MG tablet Take 1 tablet (80 mg total) by mouth daily. at 6pm     clotrimazole  (LOTRIMIN ) 1 % cream APPLY TO AFFECTED AREA TWICE A DAY 60 g 1   gabapentin  (NEURONTIN ) 300 MG capsule Take 1 capsule (300 mg total) by mouth 2 (two) times daily.     glucose blood (ONE TOUCH ULTRA TEST) test strip Check blood sugar twice a day and as instructed. Dx 250.00 100 each 3   JARDIANCE  25 MG TABS tablet Take by mouth.     metFORMIN  (GLUCOPHAGE -XR) 500 MG 24 hr tablet Take 2 tablets (1,000 mg total) by mouth 2 (two) times daily with a meal.     prochlorperazine (COMPAZINE) 10 MG tablet Take 1 tablet (10 mg total) by mouth every 6 (six) hours as needed for nausea or vomiting.  propranolol ER (INDERAL LA) 60 MG 24 hr capsule Take 1 capsule (60 mg total) by mouth daily. For migraine prevention     Semaglutide ,0.25 or 0.5MG /DOS, (OZEMPIC , 0.25 OR 0.5 MG/DOSE,) 2 MG/1.5ML SOPN Inject 0.5 mg as directed once a week. 1.5 mL 5   lisinopril  (ZESTRIL ) 10 MG tablet Pt unsure of dose     pantoprazole  (PROTONIX ) 20 MG tablet Take 1 tablet (20 mg total) [facility] daily. [facility]     sildenafil  (VIAGRA ) 100 MG tablet TAKE 1/2 TO 1 TABLET BY MOUTH DAILY AS NEEDED FOR FOR ERECTILE DYSFUNCTION 5 tablet 3   lisinopril   (ZESTRIL ) 10 MG tablet Take 1 tablet (10 mg total) by mouth daily.     No facility-administered medications prior to visit.     Per HPI unless specifically indicated in ROS section below Review of Systems  Objective:  BP 130/84   Pulse 79   Temp 98.7 F (37.1 C) (Oral)   Ht 6' 1 (1.854 m)   Wt 267 lb (121.1 kg)   SpO2 99%   BMI 35.23 kg/m   Wt Readings from Last 3 Encounters:  02/12/24 267 lb (121.1 kg)  03/14/23 275 lb 6.4 oz (124.9 kg)  09/20/22 288 lb 2 oz (130.7 kg)      Physical Exam Vitals and nursing note reviewed.  Constitutional:      Appearance: Normal appearance. He is not ill-appearing.  HENT:     Mouth/Throat:     Mouth: Mucous membranes are moist.     Pharynx: Oropharynx is clear. No oropharyngeal exudate or posterior oropharyngeal erythema.  Eyes:     Extraocular Movements: Extraocular movements intact.     Pupils: Pupils are equal, round, and reactive to light.  Neck:     Thyroid : No thyroid  mass or thyromegaly.  Cardiovascular:     Rate and Rhythm: Normal rate and regular rhythm.     Pulses: Normal pulses.     Heart sounds: Normal heart sounds. No murmur heard. Pulmonary:     Effort: Pulmonary effort is normal. No respiratory distress.     Breath sounds: Normal breath sounds. No wheezing, rhonchi or rales.  Musculoskeletal:     Right lower leg: No edema.     Left lower leg: No edema.  Skin:    General: Skin is warm and dry.     Findings: No rash.  Neurological:     Mental Status: He is alert.  Psychiatric:        Mood and Affect: Mood normal.        Behavior: Behavior normal.       Results for orders placed or performed in visit on 02/12/24  Basic metabolic panel with GFR   Collection Time: 01/24/24 12:00 AM  Result Value Ref Range   Glucose 257    CO2 22 13 - 22   Creatinine 1.2 0.6 - 1.3   Potassium 4.0 3.5 - 5.1 mEq/L   Sodium 139 137 - 147   Chloride 100 99 - 108  Comprehensive metabolic panel with GFR   Collection Time:  01/24/24 12:00 AM  Result Value Ref Range   Calcium  9.4 8.7 - 10.7  Hemoglobin A1c   Collection Time: 01/24/24 12:00 AM  Result Value Ref Range   Hemoglobin A1C 10.8     Assessment & Plan:   Problem List Items Addressed This Visit     Type 2 diabetes mellitus with other specified complication (HCC) - Primary   Chronic, remaining uncontrolled. This  is followed by [facility].  Continue current regimen - planning to increase ozempic  to 1mg  weekly.       Relevant Medications   lisinopril  (ZESTRIL ) 10 MG tablet   Other Relevant Orders   Vitamin B12   Microalbumin / creatinine urine ratio   Hypertension, essential   Chronic, stable. Continue lisinopril  10mg  daily, propranolol as well.       Relevant Medications   sildenafil  (VIAGRA ) 100 MG tablet   lisinopril  (ZESTRIL ) 10 MG tablet   Dyslipidemia associated with type 2 diabetes mellitus (HCC)   Chronic on atorvastatin  80mg  - update FLP. The 10-year ASCVD risk score (Arnett DK, et al., 2019) is: 28.6%   Values used to calculate the score:     Age: 87 years     Clincally relevant sex: Male     Is Non-Hispanic African American: Yes     Diabetic: Yes     Tobacco smoker: No     Systolic Blood Pressure: 130 mmHg     Is BP treated: Yes     HDL Cholesterol: 34.2 mg/dL     Total Cholesterol: 242 mg/dL       Relevant Medications   lisinopril  (ZESTRIL ) 10 MG tablet   Other Relevant Orders   Lipid panel   Severe obesity (BMI 35.0-39.9) with comorbidity (HCC)   Continue to encourage weight loss.  Pending increased ozempic  dose      ED (erectile dysfunction) of organic origin   Refill viagra . Reviewed side effects, need to avoid nitrates when taking. Denies exertional chest discomfort      Relevant Medications   sildenafil  (VIAGRA ) 100 MG tablet   Elevated PSA   Elevated PSA last year, lost to f/u Last UA normal.  Repeat PSA free and total today Discussed low threshold for urology referral if persistently elevated.        Relevant Orders   Urinalysis, Routine w reflex microscopic   Moderate nonproliferative diabetic retinopathy of both eyes associated with type 2 diabetes mellitus (HCC)   Reviewed latest diabetic eye exam 11/2023 with patient.        Relevant Medications   lisinopril  (ZESTRIL ) 10 MG tablet     Meds ordered this encounter  Medications   sildenafil  (VIAGRA ) 100 MG tablet    Sig: TAKE 1/2 TO 1 TABLET BY MOUTH DAILY AS NEEDED FOR FOR ERECTILE DYSFUNCTION    Dispense:  10 tablet    Refill:  6    Orders Placed This Encounter  Procedures   Basic metabolic panel with GFR    This external order was created through the Results Console.   Comprehensive metabolic panel with GFR    This external order was created through the Results Console.   Hemoglobin A1c    This external order was created through the Results Console.   Lipid panel   Vitamin B12   Urinalysis, Routine w reflex microscopic   Microalbumin / creatinine urine ratio    Patient Instructions  Labs today including prostate level and cholesterol levels  Continue current medicines.  Return as needed or in 3-4 months for physical Try to get me records or at least date of colonoscopy.   Follow up plan: Return in about 3 months (around 05/14/2024) for annual exam, prior fasting for blood work.  Anton Blas, MD

## 2024-02-12 NOTE — Assessment & Plan Note (Signed)
 Refill viagra . Reviewed side effects, need to avoid nitrates when taking. Denies exertional chest discomfort

## 2024-02-14 LAB — PSA, TOTAL WITH REFLEX TO PSA, FREE: PSA, Total: 3.2 ng/mL (ref <=4.0)

## 2024-02-16 ENCOUNTER — Ambulatory Visit: Payer: Self-pay | Admitting: Family Medicine

## 2024-02-16 DIAGNOSIS — E538 Deficiency of other specified B group vitamins: Secondary | ICD-10-CM | POA: Insufficient documentation

## 2024-02-16 MED ORDER — VITAMIN B-12 1000 MCG PO TABS
1000.0000 ug | ORAL_TABLET | Freq: Every day | ORAL | Status: DC
Start: 2024-02-16 — End: 2024-05-07

## 2024-03-24 ENCOUNTER — Other Ambulatory Visit: Payer: Self-pay

## 2024-03-24 DIAGNOSIS — N529 Male erectile dysfunction, unspecified: Secondary | ICD-10-CM

## 2024-03-24 NOTE — Telephone Encounter (Signed)
 Patent was given one refill until seen in office.looks like he had follow up and labs done 02/12/24. Ok to refill as pended.

## 2024-03-26 MED ORDER — SILDENAFIL CITRATE 100 MG PO TABS: ORAL_TABLET | ORAL | 6 refills | Status: AC

## 2024-05-07 ENCOUNTER — Encounter: Payer: Self-pay | Admitting: Family Medicine

## 2024-05-07 ENCOUNTER — Ambulatory Visit (INDEPENDENT_AMBULATORY_CARE_PROVIDER_SITE_OTHER): Payer: Self-pay | Admitting: Family Medicine

## 2024-05-07 VITALS — BP 138/84 | HR 98 | Temp 98.0°F | Ht 72.0 in | Wt 269.0 lb

## 2024-05-07 DIAGNOSIS — R972 Elevated prostate specific antigen [PSA]: Secondary | ICD-10-CM

## 2024-05-07 DIAGNOSIS — G4733 Obstructive sleep apnea (adult) (pediatric): Secondary | ICD-10-CM

## 2024-05-07 DIAGNOSIS — E113393 Type 2 diabetes mellitus with moderate nonproliferative diabetic retinopathy without macular edema, bilateral: Secondary | ICD-10-CM

## 2024-05-07 DIAGNOSIS — Z125 Encounter for screening for malignant neoplasm of prostate: Secondary | ICD-10-CM

## 2024-05-07 DIAGNOSIS — E538 Deficiency of other specified B group vitamins: Secondary | ICD-10-CM

## 2024-05-07 DIAGNOSIS — E1169 Type 2 diabetes mellitus with other specified complication: Secondary | ICD-10-CM

## 2024-05-07 DIAGNOSIS — E785 Hyperlipidemia, unspecified: Secondary | ICD-10-CM

## 2024-05-07 DIAGNOSIS — Z23 Encounter for immunization: Secondary | ICD-10-CM

## 2024-05-07 DIAGNOSIS — Z0001 Encounter for general adult medical examination with abnormal findings: Secondary | ICD-10-CM

## 2024-05-07 DIAGNOSIS — I10 Essential (primary) hypertension: Secondary | ICD-10-CM

## 2024-05-07 MED ORDER — VITAMIN B-12 1000 MCG PO TABS: 1000.0000 ug | ORAL_TABLET | ORAL | Status: DC

## 2024-05-07 NOTE — Assessment & Plan Note (Signed)
 Reviewed healthy diet and lifestyle changes to effect sustainable weight loss.   Continue ozempic  full dose.

## 2024-05-07 NOTE — Assessment & Plan Note (Signed)
 Mixed, mild on last HST eval 2021 (AHI 11.9).  Has had ~30 lb weight loss since then. Will continue to monitor

## 2024-05-07 NOTE — Assessment & Plan Note (Addendum)
 Preventative protocols reviewed and updated unless pt declined. Discussed healthy diet and lifestyle.

## 2024-05-07 NOTE — Assessment & Plan Note (Addendum)
 Chronic, [facility] has upcoming [facility] f[facility] for next month and plans to have A1c checked at that time.  Discussed monitoring for pancreatitis on ozempic  as well as importance of regular protein intake and strength training to maintain muscle mass

## 2024-05-07 NOTE — Assessment & Plan Note (Addendum)
Update levels on MWF oral replacement.

## 2024-05-07 NOTE — Patient Instructions (Addendum)
 Flu shot today  Get 2nd shingles shot at [facility].  Ask for diabetic eye exam next [facility].  Labs today  Bring me a copy of your latest colonoscopy 05/2023. Return in 6 months for diabetes check

## 2024-05-07 NOTE — Assessment & Plan Note (Signed)
 Chronic, update FLP on atorvastatin  80mg  daily. The 10-year ASCVD risk score (Arnett DK, et al., 2019) is: 30.5%   Values used to calculate the score:     Age: 58 years     Clincally relevant sex: Male     Is Non-Hispanic African American: Yes     Diabetic: Yes     Tobacco smoker: No     Systolic Blood Pressure: 138 mmHg     Is BP treated: Yes     HDL Cholesterol: 36.7 mg/dL     Total Cholesterol: 231 mg/dL

## 2024-05-07 NOTE — Assessment & Plan Note (Signed)
 Chronic, stable. Continue current regimen including lisinopril  and propranolol

## 2024-05-07 NOTE — Progress Notes (Signed)
 Ph: [phone] Fax: [phone]   Patient ID: Jonathon [name], male    DOB: Jun 28, 1966, 58 y.o.   MRN: [id number]  This visit was conducted in person.  BP 138/84   Pulse 98   Temp 98 F (36.7 C) (Oral)   Ht 6' (1.829 m)   Wt 269 lb (122 kg)   SpO2 97%   BMI 36.48 kg/m    CC: CPE Subjective:   HPI: Jonathon [name] is a 58 y.o. 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   [facility] Only has [employer], on 10% disability. Next [facility] app[facility] is 06/02/2024 - Dr Campbell.  [facility] reviewed from 01/24/2024 - A1c 10.8, Cr 1.2, GFR 69, glu 257, K 4   Elevated PSA 02/2023 to 39 with improvement on recheck to 9, then down to 3.  PSA at [facility] on 02/01/2023 was 0.84.    DM - on jardiance  25mg  and ozempic  2mg  weekly (recently increased 2 wks ago) through the [facility]. Also on metformin  XR 1000mg  bid. Glucometer brand - one touch. Last eye exam 11/2023 at [facility] - mod NPDR bilaterally.   HLD - he is fasting today. Regular with atorva 80mg  daily   H/o central and obstructive (complex) sleep apnea saw pulm 06/2020 - decided to hold off on treatment due to mild OSA. Continues working towards weight loss.  HST 02/11/20: mild complex(central and obstructive) sleep apnea, AHI 11.9/ hr, desaturation to 80%, body weight 296 lbs    Low b12 - he's taking b12 replacement MWF.  Preventative: COLONOSCOPY 02/2018 - diverticulitis, rpt 5 yrs given fmhx colon cancer ([facility]) - has rpt scheduled 06/21/2023 Prostate cancer screening - yearly PSA. Notes increased nocturia x1, strong stream.  Lung cancer screening - not eligible  Flu shot - yearly COVID vaccine Pfizer 11/2019 x2, booster 06/2020, bivalent 07/2021, 2022/06/04 Tdap 04-Jun-2013, had another one 02/2023 Pneumovax - 05/2019, prevnar 10/2023 ([facility]) RSV 10/2023 ([facility]) Shingrix at [facility] - 09/2021, pending 2nd Seat belt use discussed Sunscreen use discussed. No changing moles on skin.  Non-smoker  Alcohol - none - stopped  drinking 06/04/2017 Dentist - due  Eye exam - ~09/2021 at [facility] - upcoming appt for DR screen  Widower - wife suddenly passed away 2012-06-04. New wife.  Has 5 children but none live at home. 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 Interim medical history since our last visit reviewed. Allergies and medications reviewed and updated. Outpatient Medications Prior to Visit  Medication Sig Dispense Refill   atorvastatin  (LIPITOR) 80 MG tablet Take 1 tablet (80 mg total) by mouth daily. at 6pm     clotrimazole  (LOTRIMIN ) 1 % cream APPLY TO AFFECTED AREA TWICE A DAY 60 g 1   gabapentin  (NEURONTIN ) 300 MG capsule Take 1 capsule (300 mg total) by mouth 2 (two) times daily.     glucose blood (ONE TOUCH ULTRA TEST) test strip Check blood sugar twice a day and as instructed. Dx 250.00 100 each 3   JARDIANCE  25 MG TABS tablet Take by mouth.     lisinopril  (ZESTRIL ) 10 MG tablet Take 1 tablet (10 mg total) by mouth daily.     metFORMIN  (  GLUCOPHAGE -XR) 500 MG 24 hr tablet Take 2 tablets (1,000 mg total) by mouth 2 (two) times daily with a meal.     prochlorperazine (COMPAZINE) 10 MG tablet Take 1 tablet (10 mg total) by mouth every 6 (six) hours as needed for nausea or vomiting.     propranolol ER (INDERAL LA) 60 MG 24 hr capsule Take 1 capsule (60 mg total) by mouth daily. For migraine prevention     Semaglutide ,0.25 or 0.5MG /DOS, (OZEMPIC , 0.25 OR 0.5 MG/DOSE,) 2 MG/1.5ML SOPN Inject 0.5 mg as directed once a week. 1.5 mL 5   sildenafil  (VIAGRA ) 100 MG tablet TAKE 1/2 TO 1 TABLET BY MOUTH DAILY AS NEEDED FOR FOR ERECTILE DYSFUNCTION 10 tablet 6   cyanocobalamin  (VITAMIN B12) 1000 MCG tablet Take 1 tablet (1,000 mcg total) by mouth daily.     No  facility-administered medications prior to visit.     Per HPI unless specifically indicated in ROS section below Review of Systems  Constitutional:  Negative for activity change, appetite change, chills, fatigue, fever and unexpected weight change.  HENT:  Negative for hearing loss.   Eyes:  Negative for visual disturbance.  Respiratory:  Negative for cough, chest tightness, shortness of breath and wheezing.   Cardiovascular:  Positive for leg swelling. Negative for chest pain and palpitations.  Gastrointestinal:  Positive for diarrhea. Negative for abdominal distention, abdominal pain, blood in stool, constipation, nausea and vomiting.  Genitourinary:  Negative for difficulty urinating and hematuria.  Musculoskeletal:  Negative for arthralgias, myalgias and neck pain.  Skin:  Negative for rash.  Neurological:  Positive for headaches (h/o migraines). Negative for dizziness, seizures and syncope.  Hematological:  Negative for adenopathy. Does not bruise/bleed easily.  Psychiatric/Behavioral:  Positive for dysphoric mood. The patient is not nervous/anxious.     Objective:  BP 138/84   Pulse 98   Temp 98 F (36.7 C) (Oral)   Ht 6' (1.829 m)   Wt 269 lb (122 kg)   SpO2 97%   BMI 36.48 kg/m   Wt Readings from Last 3 Encounters:  05/07/24 269 lb (122 kg)  02/12/24 267 lb (121.1 kg)  03/14/23 275 lb 6.4 oz (124.9 kg)      Physical Exam Vitals and nursing note reviewed.  Constitutional:      General: He is not in acute distress.    Appearance: Normal appearance. He is well-developed. He is not ill-appearing.  HENT:     Head: Normocephalic and atraumatic.     Right Ear: Hearing, tympanic membrane, ear canal and external ear normal.     Left Ear: Hearing, tympanic membrane, ear canal and external ear normal.     Mouth/Throat:     Mouth: Mucous membranes are moist.     Pharynx: Oropharynx is clear. No oropharyngeal exudate or posterior oropharyngeal erythema.  Eyes:     General:  No scleral icterus.    Extraocular Movements: Extraocular movements intact.     Conjunctiva/sclera: Conjunctivae normal.     Pupils: Pupils are equal, round, and reactive to light.  Neck:     Thyroid : No thyroid  mass or thyromegaly.  Cardiovascular:     Rate and Rhythm: Normal rate and regular rhythm.     Pulses: Normal pulses.          Radial pulses are 2+ on the right side and 2+ on the left side.     Heart sounds: Normal heart sounds. No murmur heard. Pulmonary:     Effort: Pulmonary effort is normal.  No respiratory distress.     Breath sounds: Normal breath sounds. No wheezing, rhonchi or rales.  Abdominal:     General: Bowel sounds are normal. There is no distension.     Palpations: Abdomen is soft. There is no mass.     Tenderness: There is no abdominal tenderness. There is no guarding or rebound.     Hernia: No hernia is present.  Musculoskeletal:        General: Normal range of motion.     Cervical back: Normal range of motion and neck supple.     Right lower leg: No edema.     Left lower leg: No edema.  Lymphadenopathy:     Cervical: No cervical adenopathy.  Skin:    General: Skin is warm and dry.     Findings: No rash.  Neurological:     General: No focal deficit present.     Mental Status: He is alert and oriented to person, place, and time.  Psychiatric:        Mood and Affect: Mood normal.        Behavior: Behavior normal.        Thought Content: Thought content normal.        Judgment: Judgment normal.       Results for orders placed or performed in visit on 02/12/24  Basic metabolic panel with GFR   Collection Time: 01/24/24 12:00 AM  Result Value Ref Range   Glucose 257    CO2 22 13 - 22   Creatinine 1.2 0.6 - 1.3   Potassium 4.0 3.5 - 5.1 mEq/L   Sodium 139 137 - 147   Chloride 100 99 - 108  Comprehensive metabolic panel with GFR   Collection Time: 01/24/24 12:00 AM  Result Value Ref Range   Calcium  9.4 8.7 - 10.7  Hemoglobin A1c   Collection  Time: 01/24/24 12:00 AM  Result Value Ref Range   Hemoglobin A1C 10.8   PSA, Total with Reflex to PSA, Free   Collection Time: 02/12/24 10:11 AM  Result Value Ref Range   PSA, Total 3.2 < OR = 4.0 ng/mL  Lipid panel   Collection Time: 02/12/24 10:11 AM  Result Value Ref Range   Cholesterol 231 (H) 0 - 200 mg/dL   Triglycerides (H) 0.0 - 149.0 mg/dL    579.9 Triglyceride is over 400; calculations on Lipids are invalid.   HDL 36.70 (L) >39.00 mg/dL   VLDL 15.9 (H) 0.0 - 59.9 mg/dL   Total CHOL/HDL Ratio 6    NonHDL 194.22   Vitamin B12   Collection Time: 02/12/24 10:11 AM  Result Value Ref Range   Vitamin B-12 222 211 - 911 pg/mL  Urinalysis, Routine w reflex microscopic   Collection Time: 02/12/24 10:11 AM  Result Value Ref Range   Color, Urine YELLOW Yellow;Lt. Yellow;Straw;Dark Yellow;Amber;Green;Red;Brown   APPearance Sl Cloudy (A) Clear;Turbid;Slightly Cloudy;Cloudy   Specific Gravity, Urine <=1.005 (A) 1.000 - 1.030   pH 6.0 5.0 - 8.0   Total Protein, Urine NEGATIVE Negative   Urine Glucose >=1000 (A) Negative   Ketones, ur NEGATIVE Negative   Bilirubin Urine NEGATIVE Negative   Hgb urine dipstick NEGATIVE Negative   Urobilinogen, UA 0.2 0.0 - 1.0   Leukocytes,Ua TRACE (A) Negative   Nitrite NEGATIVE Negative   WBC, UA 3-6/hpf (A) 0-2/hpf   RBC / HPF none seen 0-2/hpf   Squamous Epithelial / HPF Rare(0-4/hpf) Rare(0-4/hpf)  Microalbumin / creatinine urine ratio   Collection Time:  02/12/24 10:11 AM  Result Value Ref Range   Microalb, Ur 1.4 0.0 - 1.9 mg/dL   Creatinine,U 53.5 mg/dL   Microalb Creat Ratio 29.7 0.0 - 30.0 mg/g  LDL cholesterol, direct   Collection Time: 02/12/24 10:11 AM  Result Value Ref Range   Direct LDL 73.0 mg/dL    Assessment & Plan:   Problem List Items Addressed This Visit     Encounter for general adult medical examination with abnormal findings - Primary (Chronic)   Preventative protocols reviewed and updated unless pt  declined. Discussed healthy diet and lifestyle.       Type 2 diabetes mellitus with other specified complication (HCC)   Chronic, he has upcoming [facility] f[facility] for next month and plans to have A1c checked at that time.  Discussed monitoring for pancreatitis on ozempic  as well as importance of regular protein intake and strength training to maintain muscle mass       Relevant Orders   Vitamin B12   Hypertension, essential   Chronic, stable. Continue current regimen including lisinopril  and propranolol      Dyslipidemia associated with type 2 diabetes mellitus (HCC)   Chronic, update FLP on atorvastatin  80mg  daily. The 10-year ASCVD risk score (Arnett DK, et al., 2019) is: 30.5%   Values used to calculate the score:     Age: 48 years     Clincally relevant sex: Male     Is Non-Hispanic African American: Yes     Diabetic: Yes     Tobacco smoker: No     Systolic Blood Pressure: 138 mmHg     Is BP treated: Yes     HDL Cholesterol: 36.7 mg/dL     Total Cholesterol: 231 mg/dL       Relevant Orders   Lipid panel   Comprehensive metabolic panel with GFR   Severe obesity (BMI 35.0-39.9) with comorbidity (HCC)   Reviewed healthy diet and lifestyle changes to effect sustainable weight loss.   Continue ozempic  full dose.       OSA (obstructive sleep apnea)   Mixed, mild on last HST eval 2021 (AHI 11.9).  Has had ~30 lb weight loss since then. Will continue to monitor      Elevated PSA   Isolated elevation to 39 last year, subsequently dropped to 9 then 3. Prior baseline 0.8. update PSA There is fmhx prostate cancer (father, brother)      Moderate nonproliferative diabetic retinopathy of both eyes associated with type 2 diabetes mellitus (HCC)   [facility]      Vitamin B12 deficiency   Update levels on MWF oral replacement.       Other Visit Diagnoses       Special screening for malignant neoplasm of prostate       Relevant Orders   PSA      Encounter for immunization       Relevant Orders   Flu vaccine HIGH DOSE PF(Fluzone Trivalent) (Completed)        Meds ordered this encounter  Medications   cyanocobalamin  (VITAMIN B12) 1000 MCG tablet    Sig: Take 1 tablet (1,000 mcg total) by mouth every Monday, Wednesday, and Friday.    Orders Placed This Encounter  Procedures   Flu vaccine HIGH DOSE PF(Fluzone Trivalent)   Lipid panel   PSA   Vitamin B12   Comprehensive metabolic panel with GFR    Patient Instructions  Flu shot today  [facility]  Ask for diabetic eye exam next [facility] eye [facility] appointment.  Labs today  Bring me a copy of your latest colonoscopy 05/2023. Return in 6 months for diabetes check   Follow up plan: Return in about 6 months (around 11/05/2024) for follow up visit.  Anton Blas, MD

## 2024-05-07 NOTE — Assessment & Plan Note (Addendum)
 Isolated elevation to 39 last year, subsequently dropped to 9 then 3. Prior baseline 0.8. update PSA There is fmhx prostate cancer (father, brother)

## 2024-05-07 NOTE — Assessment & Plan Note (Signed)
 [facility]

## 2024-05-08 LAB — LIPID PANEL
Cholesterol: 235 mg/dL — ABNORMAL HIGH (ref 0–200)
HDL: 38.3 mg/dL — ABNORMAL LOW (ref >39.00)
NonHDL: 196.69
Total CHOL/HDL Ratio: 6
Triglycerides: 406 mg/dL — ABNORMAL HIGH (ref 0.0–149.0)
VLDL: 81.2 mg/dL — ABNORMAL HIGH (ref 0.0–40.0)

## 2024-05-08 LAB — COMPREHENSIVE METABOLIC PANEL WITH GFR
ALT: 12 U/L (ref 0–53)
AST: 10 U/L (ref 0–37)
Albumin: 4.3 g/dL (ref 3.5–5.2)
Alkaline Phosphatase: 69 U/L (ref 39–117)
BUN: 13 mg/dL (ref 6–23)
CO2: 28 meq/L (ref 19–32)
Calcium: 9.9 mg/dL (ref 8.4–10.5)
Chloride: 101 meq/L (ref 96–112)
Creatinine, Ser: 1.05 mg/dL (ref 0.40–1.50)
GFR: 77.07 mL/min (ref >60.00)
Glucose, Bld: 198 mg/dL — ABNORMAL HIGH (ref 70–99)
Potassium: 4.2 meq/L (ref 3.5–5.1)
Sodium: 139 meq/L (ref 135–145)
Total Bilirubin: 0.5 mg/dL (ref 0.2–1.2)
Total Protein: 7.3 g/dL (ref 6.0–8.3)

## 2024-05-08 LAB — VITAMIN B12: Vitamin B-12: 242 pg/mL (ref 211–911)

## 2024-05-08 LAB — LDL CHOLESTEROL, DIRECT: Direct LDL: 101 mg/dL

## 2024-05-08 LAB — PSA: PSA: 4.56 ng/mL — ABNORMAL HIGH (ref 0.10–4.00)

## 2024-05-15 ENCOUNTER — Ambulatory Visit: Payer: Self-pay | Admitting: Family Medicine

## 2024-05-15 MED ORDER — VITAMIN B-12 1000 MCG PO TABS: 1000.0000 ug | ORAL_TABLET | Freq: Every day | ORAL | Status: AC

## 2024-05-21 ENCOUNTER — Encounter: Admitting: Family Medicine

## 2024-07-01 ENCOUNTER — Encounter: Admitting: Family Medicine

## 2024-11-11 ENCOUNTER — Ambulatory Visit: Payer: Self-pay | Admitting: Family Medicine
